# Patient Record
Sex: Female | Born: 1935
Health system: Southern US, Community
[De-identification: ages and names within clinical notes are randomized; demographics above are authoritative.]

## PROBLEM LIST (undated history)

## (undated) DIAGNOSIS — E785 Hyperlipidemia, unspecified: Secondary | ICD-10-CM

## (undated) DIAGNOSIS — J45909 Unspecified asthma, uncomplicated: Secondary | ICD-10-CM

## (undated) DIAGNOSIS — I10 Essential (primary) hypertension: Secondary | ICD-10-CM

## (undated) DIAGNOSIS — I6529 Occlusion and stenosis of unspecified carotid artery: Secondary | ICD-10-CM

## (undated) DIAGNOSIS — M810 Age-related osteoporosis without current pathological fracture: Secondary | ICD-10-CM

## (undated) DIAGNOSIS — D351 Benign neoplasm of parathyroid gland: Secondary | ICD-10-CM

## (undated) DIAGNOSIS — I255 Ischemic cardiomyopathy: Secondary | ICD-10-CM

## (undated) DIAGNOSIS — R739 Hyperglycemia, unspecified: Secondary | ICD-10-CM

## (undated) DIAGNOSIS — I252 Old myocardial infarction: Secondary | ICD-10-CM

## (undated) DIAGNOSIS — I219 Acute myocardial infarction, unspecified: Secondary | ICD-10-CM

## (undated) DIAGNOSIS — R519 Headache, unspecified: Secondary | ICD-10-CM

## (undated) HISTORY — PX: CATARACT EXTRACTION: SUR2

## (undated) HISTORY — PX: APPENDECTOMY: SHX54

## (undated) HISTORY — PX: FRACTURE SURGERY: SHX138

## (undated) HISTORY — PX: EYE SURGERY: SHX253

## (undated) HISTORY — PX: ABDOMINAL HYSTERECTOMY: SHX81

---

## 1960-05-13 DIAGNOSIS — I219 Acute myocardial infarction, unspecified: Secondary | ICD-10-CM

## 1960-05-13 HISTORY — DX: Acute myocardial infarction, unspecified: I21.9

## 2005-10-29 ENCOUNTER — Ambulatory Visit: Payer: Self-pay | Admitting: Internal Medicine

## 2005-10-29 ENCOUNTER — Ambulatory Visit: Payer: Self-pay | Admitting: Ophthalmology

## 2005-10-31 ENCOUNTER — Ambulatory Visit: Payer: Self-pay | Admitting: Internal Medicine

## 2005-11-04 ENCOUNTER — Ambulatory Visit: Payer: Self-pay | Admitting: Ophthalmology

## 2005-12-24 ENCOUNTER — Ambulatory Visit: Payer: Self-pay | Admitting: Ophthalmology

## 2005-12-30 ENCOUNTER — Ambulatory Visit: Payer: Self-pay | Admitting: Ophthalmology

## 2007-03-09 ENCOUNTER — Ambulatory Visit: Payer: Self-pay | Admitting: Family

## 2009-01-31 ENCOUNTER — Ambulatory Visit: Payer: Self-pay | Admitting: Family

## 2009-03-29 ENCOUNTER — Ambulatory Visit: Payer: Self-pay | Admitting: Family

## 2013-01-19 ENCOUNTER — Emergency Department: Payer: Self-pay | Admitting: Emergency Medicine

## 2013-09-15 ENCOUNTER — Observation Stay: Payer: Self-pay | Admitting: Internal Medicine

## 2013-09-15 LAB — COMPREHENSIVE METABOLIC PANEL
ANION GAP: 5 — AB (ref 7–16)
AST: 22 U/L (ref 15–37)
Albumin: 3.6 g/dL (ref 3.4–5.0)
Alkaline Phosphatase: 67 U/L
BUN: 22 mg/dL — AB (ref 7–18)
Bilirubin,Total: 0.5 mg/dL (ref 0.2–1.0)
Calcium, Total: 8.9 mg/dL (ref 8.5–10.1)
Chloride: 104 mmol/L (ref 98–107)
Co2: 27 mmol/L (ref 21–32)
Creatinine: 1.08 mg/dL (ref 0.60–1.30)
GFR CALC AF AMER: 53 — AB
GFR CALC NON AF AMER: 46 — AB
Glucose: 167 mg/dL — ABNORMAL HIGH (ref 65–99)
Osmolality: 279 (ref 275–301)
Potassium: 4.3 mmol/L (ref 3.5–5.1)
SGPT (ALT): 16 U/L (ref 12–78)
SODIUM: 136 mmol/L (ref 136–145)
TOTAL PROTEIN: 7.2 g/dL (ref 6.4–8.2)

## 2013-09-15 LAB — APTT: ACTIVATED PTT: 31.6 s (ref 23.6–35.9)

## 2013-09-15 LAB — TROPONIN I
Troponin-I: <0.02 ng/mL
Troponin-I: <0.02 ng/mL

## 2013-09-15 LAB — CBC
HCT: 38.6 % (ref 35.0–47.0)
HGB: 13 g/dL (ref 12.0–16.0)
MCH: 30.4 pg (ref 26.0–34.0)
MCHC: 33.6 g/dL (ref 32.0–36.0)
MCV: 91 fL (ref 80–100)
Platelet: 214 10*3/uL (ref 150–440)
RBC: 4.26 10*6/uL (ref 3.80–5.20)
RDW: 13 % (ref 11.5–14.5)
WBC: 5.4 10*3/uL (ref 3.6–11.0)

## 2013-09-15 LAB — CK TOTAL AND CKMB (NOT AT ARMC)
CK, TOTAL: 39 U/L
CK, TOTAL: 40 U/L
CK, Total: 48 U/L
CK-MB: 1 ng/mL (ref 0.5–3.6)
CK-MB: 1 ng/mL (ref 0.5–3.6)
CK-MB: 1.1 ng/mL (ref 0.5–3.6)

## 2013-09-15 LAB — PRO B NATRIURETIC PEPTIDE: B-TYPE NATIURETIC PEPTID: 210 pg/mL (ref 0–450)

## 2013-09-15 LAB — PROTIME-INR
INR: 1
Prothrombin Time: 13.3 secs (ref 11.5–14.7)

## 2013-09-16 LAB — CBC WITH DIFFERENTIAL/PLATELET
Basophil #: 0 10*3/uL (ref 0.0–0.1)
Basophil %: 0.6 %
EOS ABS: 0.2 10*3/uL (ref 0.0–0.7)
EOS PCT: 3.2 %
HCT: 36.5 % (ref 35.0–47.0)
HGB: 12.1 g/dL (ref 12.0–16.0)
LYMPHS ABS: 1.5 10*3/uL (ref 1.0–3.6)
Lymphocyte %: 30.8 %
MCH: 30 pg (ref 26.0–34.0)
MCHC: 33.2 g/dL (ref 32.0–36.0)
MCV: 90 fL (ref 80–100)
MONOS PCT: 9.2 %
Monocyte #: 0.4 x10 3/mm (ref 0.2–0.9)
NEUTROS PCT: 56.2 %
Neutrophil #: 2.7 10*3/uL (ref 1.4–6.5)
Platelet: 188 10*3/uL (ref 150–440)
RBC: 4.05 10*6/uL (ref 3.80–5.20)
RDW: 13.1 % (ref 11.5–14.5)
WBC: 4.8 10*3/uL (ref 3.6–11.0)

## 2013-09-16 LAB — BASIC METABOLIC PANEL
Anion Gap: 3 — ABNORMAL LOW (ref 7–16)
BUN: 21 mg/dL — ABNORMAL HIGH (ref 7–18)
Calcium, Total: 8.6 mg/dL (ref 8.5–10.1)
Chloride: 103 mmol/L (ref 98–107)
Co2: 32 mmol/L (ref 21–32)
Creatinine: 1.53 mg/dL — ABNORMAL HIGH (ref 0.60–1.30)
EGFR (African American): 35 — ABNORMAL LOW
EGFR (Non-African Amer.): 30 — ABNORMAL LOW
GLUCOSE: 94 mg/dL (ref 65–99)
OSMOLALITY: 278 (ref 275–301)
POTASSIUM: 3.8 mmol/L (ref 3.5–5.1)
SODIUM: 138 mmol/L (ref 136–145)

## 2013-09-17 LAB — BASIC METABOLIC PANEL
ANION GAP: 6 — AB (ref 7–16)
BUN: 27 mg/dL — ABNORMAL HIGH (ref 7–18)
CALCIUM: 8.5 mg/dL (ref 8.5–10.1)
CREATININE: 1.52 mg/dL — AB (ref 0.60–1.30)
Chloride: 104 mmol/L (ref 98–107)
Co2: 29 mmol/L (ref 21–32)
GFR CALC AF AMER: 35 — AB
GFR CALC NON AF AMER: 30 — AB
GLUCOSE: 87 mg/dL (ref 65–99)
OSMOLALITY: 282 (ref 275–301)
POTASSIUM: 3.7 mmol/L (ref 3.5–5.1)
Sodium: 139 mmol/L (ref 136–145)

## 2013-09-17 LAB — CBC WITH DIFFERENTIAL/PLATELET
BASOS ABS: 0 10*3/uL (ref 0.0–0.1)
Basophil %: 0.9 %
Eosinophil #: 0.2 10*3/uL (ref 0.0–0.7)
Eosinophil %: 3.8 %
HCT: 37.4 % (ref 35.0–47.0)
HGB: 12.5 g/dL (ref 12.0–16.0)
Lymphocyte #: 1.7 10*3/uL (ref 1.0–3.6)
Lymphocyte %: 37.7 %
MCH: 30.1 pg (ref 26.0–34.0)
MCHC: 33.3 g/dL (ref 32.0–36.0)
MCV: 90 fL (ref 80–100)
MONO ABS: 0.4 x10 3/mm (ref 0.2–0.9)
MONOS PCT: 9.1 %
NEUTROS ABS: 2.1 10*3/uL (ref 1.4–6.5)
Neutrophil %: 48.5 %
PLATELETS: 173 10*3/uL (ref 150–440)
RBC: 4.15 10*6/uL (ref 3.80–5.20)
RDW: 13.1 % (ref 11.5–14.5)
WBC: 4.4 10*3/uL (ref 3.6–11.0)

## 2013-09-24 DIAGNOSIS — I4891 Unspecified atrial fibrillation: Secondary | ICD-10-CM | POA: Insufficient documentation

## 2013-09-28 DIAGNOSIS — N183 Chronic kidney disease, stage 3 unspecified: Secondary | ICD-10-CM | POA: Insufficient documentation

## 2014-09-03 NOTE — H&P (Signed)
PATIENT NAME:  Anna Barrett, Anna Barrett MR#:  [id number] DATE OF BIRTH:  November 05, 1922  DATE OF ADMISSION:  09/15/2013  PRIMARY CARE PHYSICIAN: Ocie Cornfield. Ouida Sills, MD  CARDIOLOGIST: Corey Skains, MD  The patient is an 81 year old Caucasian female with past medical history significant for history of hypertension, congestive heart failure, hyperlipidemia, as well as coronary artery disease and asthma, who presents to the hospital with complaints of weakness. According to the patient, she was doing well until today when she became very weak and her legs were stumbling. She had to sit down. She was also nauseated and sick to her stomach and felt presyncopal. She also apparently complained of some chest pains to Emergency Room physician; however, she denied any significant chest pains to me. She admits that she has been having chest pains for the past 3 years now, for which she is taking nitroglycerin intermittently. Admits that over the past few months, she has been having problems with cold hands, turning them white as well as blue intermittently, and today she had quivery legs according to her. Her last stress was years ago. In fact, I was able to find that she had echocardiogram as well as stress test before November 2010, and apparently these were normal. She presented to the hospital for further evaluation. In the Emergency Room, she was noted to be tachycardic with heart rate around 110. She was also hypertensive with systolic blood pressure around 160.   PAST MEDICAL HISTORY: Significant for history of hypertension, hyperlipidemia, asthma, history of congestive heart failure (diastolic, chronic), coronary artery disease, ischemic cardiomyopathy, depression, left bundle branch block, migraine headaches. Echo as well as stress test done before November 2010 which were normal.   PAST SURGICAL HISTORY: Gallbladder, appendectomy, as well as hysterectomy.   ALLERGIES: MULTIPLE INCLUDING SPIRONOLACTONE, IV DYE,  LIPITOR, AS WELL AS SHELLFISH.  FAMILY HISTORY: Colon cancer, hypertension, as well as coronary disease.   SOCIAL HISTORY: The patient is widowed. No alcohol abuse or smoking.  REVIEW OF SYSTEMS: Difficult to obtain, as the patient is a poor historian. Admits of feeling cold intermittently, fatigue and weak, some blurring of vision. She tells me that her eye vision is impaired because of some kind of infection. Also some sinus congestion, allergies (possibly seasonal), some intermittent cough as well as intermittent wheezes, and shortness of breath especially whenever she walks around, however, denies any significant orthopnea. Admits of having some whitish phlegm. Intermittent chest pains, which she describes as chronic, has been going on for a few years. Lower extremity edema seems to be in left lower extremity more than right lower extremity. Also feeling presyncopal earlier today, as well as nausea and dry heaving.  CONSTITUTIONAL: Otherwise, denies any high fevers, pains, weight loss, weight gain.  EYES: Denies any double vision or glaucoma.  EARS, NOSE, THROAT: Denies any tinnitus, allergies, epistaxis, sinus pain, dentures, difficulty swallowing. RESPIRATORY: Denies any hemoptysis, asthma, COPD. CARDIOVASCULAR: Denies any orthopnea, arrhythmias or palpitations. GASTROINTESTINAL: Denies any vomiting, diarrhea or constipation. GENITOURINARY: Denies dysuria, hematuria, frequency or incontinence. ENDOCRINE: Denies any polydipsia, nocturia, thyroid problems, heat or cold intolerance or thirst. HEMATOLOGIC: Denies any anemia, easy bruising, bleeding, swollen glands. SKIN: Denies any acne, rashes, lesions or change in moles.  MUSCULOSKELETAL: Denies arthritis, cramps, swelling, gout.  NEUROLOGICAL: No numbness, epilepsy or tremors. PSYCHIATRIC: Denies anxiety, insomnia or depression.  PHYSICAL EXAMINATION:  VITAL SIGNS: On arrival to the hospital, the patient's temperature was 97.4. Pulse was  111. Respiratory rate was 18, blood pressure 165/99. Saturation  was 97% on room air. GENERAL: This is a well-developed, well-nourished, Caucasian female. She is comfortable on the stretcher.  HEENT: Her pupils are equal and reactive to light. Extraocular movements intact. No icterus or conjunctivitis. Has normal hearing. No pharyngeal erythema. Mucosa is moist.  NECK: No masses. Supple, nontender. Thyroid is not enlarged. No adenopathy. No JVD or carotid bruits bilaterally. Full range of motion.  LUNGS: Clear to auscultation in upper lobes, however, some rales and crackles at the bases. She has no wheezing, labored inspirations, increased effort, dullness to percussion. Not in overt respiratory distress.  CARDIOVASCULAR: S1, S2 appreciated. The patient's rhythm was irregularly irregular. PMI not lateralized. She has mild murmur, II/VI systolic murmur in precordium. Chest is nontender to palpation.  EXTREMITIES: Pedal pulses 1+, 1 to 2+ lower extremity edema around her ankles. No calf tenderness or cyanosis was noted.  ABDOMEN: Soft, nontender. Bowel sounds are present. No hepatosplenomegaly or masses were noted.  RECTAL: Deferred.  MUSCULOSKELETAL: Muscle strength: Able to move all extremities. No cyanosis, degenerative joint disease or kyphosis was noted. Gait was not tested.  SKIN: Did not reveal any rashes, lesions, erythema, nodularity or induration. It was warm and dry to palpation.  LYMPHATIC: No adenopathy in the cervical region.  NEUROLOGICAL: Cranial nerves grossly intact. Sensory is intact. No dysarthria or aphasia. The patient is alert, oriented to time, person and place, cooperative. Memory is good; however, she is a very difficult historian. PSYCHIATRIC: No significant confusion, agitation or depression. She has intermittent laugh episodes, which are just inappropriate.  LABORATORY DATA: BMP showed glucose of 167, BUN of 22. Beta-type natriuretic peptide was 210. Otherwise, BMP was  unremarkable. The patient's liver enzymes were normal. Cardiac enzymes x 1 were within normal limits CBC within normal limits with white blood cell count of 5.4, hemoglobin 13.0, platelet count 214. Coagulation panel was unremarkable with pro time of 13.3, INR 1.0, and activated PTT 31.6.  The patient's EKG showed wide QRS rhythm with premature supraventricular complexes, left bundle branch block, abnormal EKG. Nonspecific ST-T changes were noted. No EKG to compare with. Likely A. fib.   RADIOLOGIC STUDIES: Chest x-ray, portable single view, 6th of May 2015, revealed no acute disease.   ASSESSMENT AND PLAN: 1.  Presyncope: Questionably related to atrial fibrillation. Admit patient to medical floor. Get orthostatics. Get Myoview stress test as well as carotid ultrasound. Get cardiology consultation. 2.  Tachycardia with atrial fibrillation: Get TSH and advance the patient's Coreg for now. We may need to add other medications. 3.  Hypertension: Add lisinopril to advanced Coreg. Follow blood pressure readings. 4.  Acute on chronic diastolic congestive heart failure: Will give Lasix IV once. Will continue torsemide and will advance blood pressure medications, adding lisinopril.  5.  Atrial fibrillation, new diagnosis: Get echocardiogram as well as my Myoview. Cardiology consultation is pending. Will also initiate the patient on aspirin therapy. The patient would benefit from anticoagulation if the patient's atrial fibrillation is chronic.   TIME SPENT: 50 minutes.    ____________________________ Theodoro Grist, MD rv:jcm D: 09/15/2013 13:50:58 ET T: 09/15/2013 15:46:28 ET JOB#: [id number]  cc: Theodoro Grist, MD, <Dictator> Ocie Cornfield. Ouida Sills, MD  Theodoro Grist MD ELECTRONICALLY SIGNED 10/19/2013 15:29

## 2014-10-14 ENCOUNTER — Other Ambulatory Visit: Payer: Self-pay | Admitting: Physician Assistant

## 2014-10-14 ENCOUNTER — Ambulatory Visit
Admission: RE | Admit: 2014-10-14 | Discharge: 2014-10-14 | Disposition: A | Discharge status: Home / Self Care | Payer: Medicare Other | Source: Ambulatory Visit | Attending: Physician Assistant | Admitting: Physician Assistant

## 2014-10-14 DIAGNOSIS — M7989 Other specified soft tissue disorders: Secondary | ICD-10-CM

## 2015-01-10 DIAGNOSIS — D692 Other nonthrombocytopenic purpura: Secondary | ICD-10-CM | POA: Insufficient documentation

## 2015-04-16 ENCOUNTER — Emergency Department: Payer: Medicare Other

## 2015-04-16 ENCOUNTER — Inpatient Hospital Stay
Admission: EM | Admit: 2015-04-16 | Discharge: 2015-04-19 | DRG: 493 | Disposition: A | Discharge status: Skilled Nursing Facility | Payer: Medicare Other | Attending: Internal Medicine | Admitting: Internal Medicine

## 2015-04-16 DIAGNOSIS — E785 Hyperlipidemia, unspecified: Secondary | ICD-10-CM | POA: Diagnosis present

## 2015-04-16 DIAGNOSIS — R000 Tachycardia, unspecified: Secondary | ICD-10-CM | POA: Diagnosis present

## 2015-04-16 DIAGNOSIS — S82892A Other fracture of left lower leg, initial encounter for closed fracture: Secondary | ICD-10-CM | POA: Diagnosis present

## 2015-04-16 DIAGNOSIS — I252 Old myocardial infarction: Secondary | ICD-10-CM

## 2015-04-16 DIAGNOSIS — M810 Age-related osteoporosis without current pathological fracture: Secondary | ICD-10-CM | POA: Diagnosis present

## 2015-04-16 DIAGNOSIS — Z8249 Family history of ischemic heart disease and other diseases of the circulatory system: Secondary | ICD-10-CM | POA: Diagnosis not present

## 2015-04-16 DIAGNOSIS — I110 Hypertensive heart disease with heart failure: Secondary | ICD-10-CM | POA: Diagnosis present

## 2015-04-16 DIAGNOSIS — S9305XA Dislocation of left ankle joint, initial encounter: Secondary | ICD-10-CM

## 2015-04-16 DIAGNOSIS — W07XXXA Fall from chair, initial encounter: Secondary | ICD-10-CM | POA: Diagnosis present

## 2015-04-16 DIAGNOSIS — J45909 Unspecified asthma, uncomplicated: Secondary | ICD-10-CM | POA: Diagnosis present

## 2015-04-16 DIAGNOSIS — D62 Acute posthemorrhagic anemia: Secondary | ICD-10-CM | POA: Diagnosis not present

## 2015-04-16 DIAGNOSIS — S82852A Displaced trimalleolar fracture of left lower leg, initial encounter for closed fracture: Principal | ICD-10-CM | POA: Diagnosis present

## 2015-04-16 DIAGNOSIS — I509 Heart failure, unspecified: Secondary | ICD-10-CM | POA: Diagnosis present

## 2015-04-16 DIAGNOSIS — R0600 Dyspnea, unspecified: Secondary | ICD-10-CM

## 2015-04-16 DIAGNOSIS — Y92009 Unspecified place in unspecified non-institutional (private) residence as the place of occurrence of the external cause: Secondary | ICD-10-CM | POA: Diagnosis not present

## 2015-04-16 DIAGNOSIS — S82899A Other fracture of unspecified lower leg, initial encounter for closed fracture: Secondary | ICD-10-CM

## 2015-04-16 DIAGNOSIS — R200 Anesthesia of skin: Secondary | ICD-10-CM | POA: Diagnosis present

## 2015-04-16 HISTORY — DX: Hyperlipidemia, unspecified: E78.5

## 2015-04-16 HISTORY — DX: Old myocardial infarction: I25.2

## 2015-04-16 HISTORY — DX: Age-related osteoporosis without current pathological fracture: M81.0

## 2015-04-16 HISTORY — DX: Essential (primary) hypertension: I10

## 2015-04-16 HISTORY — DX: Acute myocardial infarction, unspecified: I21.9

## 2015-04-16 LAB — BASIC METABOLIC PANEL
ANION GAP: 5 (ref 5–15)
BUN: 27 mg/dL — ABNORMAL HIGH (ref 6–20)
CO2: 26 mmol/L (ref 22–32)
Calcium: 8.7 mg/dL — ABNORMAL LOW (ref 8.9–10.3)
Chloride: 107 mmol/L (ref 101–111)
Creatinine, Ser: 0.99 mg/dL (ref 0.44–1.00)
GFR, EST AFRICAN AMERICAN: 56 mL/min — AB (ref >60)
GFR, EST NON AFRICAN AMERICAN: 49 mL/min — AB (ref >60)
Glucose, Bld: 117 mg/dL — ABNORMAL HIGH (ref 65–99)
POTASSIUM: 4.4 mmol/L (ref 3.5–5.1)
SODIUM: 138 mmol/L (ref 135–145)

## 2015-04-16 LAB — CBC WITH DIFFERENTIAL/PLATELET
BASOS ABS: 0.1 10*3/uL (ref 0–0.1)
BASOS ABS: 0.1 10*3/uL (ref 0–0.1)
Basophils Relative: 1 %
Basophils Relative: 1 %
EOS PCT: 1 %
Eosinophils Absolute: 0.1 10*3/uL (ref 0–0.7)
Eosinophils Absolute: 0.1 10*3/uL (ref 0–0.7)
Eosinophils Relative: 1 %
HEMATOCRIT: 38.6 % (ref 35.0–47.0)
HEMATOCRIT: 37 % (ref 35.0–47.0)
HEMOGLOBIN: 12.9 g/dL (ref 12.0–16.0)
Hemoglobin: 12.4 g/dL (ref 12.0–16.0)
LYMPHS ABS: 0.7 10*3/uL — AB (ref 1.0–3.6)
LYMPHS PCT: 7 %
LYMPHS PCT: 7 %
Lymphs Abs: 0.6 10*3/uL — ABNORMAL LOW (ref 1.0–3.6)
MCH: 30.1 pg (ref 26.0–34.0)
MCH: 30.3 pg (ref 26.0–34.0)
MCHC: 33.5 g/dL (ref 32.0–36.0)
MCHC: 33.5 g/dL (ref 32.0–36.0)
MCV: 89.9 fL (ref 80.0–100.0)
MCV: 90.5 fL (ref 80.0–100.0)
MONO ABS: 0.6 10*3/uL (ref 0.2–0.9)
Monocytes Absolute: 0.5 10*3/uL (ref 0.2–0.9)
Monocytes Relative: 7 %
Monocytes Relative: 6 %
NEUTROS ABS: 6.7 10*3/uL — AB (ref 1.4–6.5)
NEUTROS ABS: 8.8 10*3/uL — AB (ref 1.4–6.5)
NEUTROS PCT: 84 %
Neutrophils Relative %: 85 %
PLATELETS: 274 10*3/uL (ref 150–440)
Platelets: 274 10*3/uL (ref 150–440)
RBC: 4.29 MIL/uL (ref 3.80–5.20)
RBC: 4.09 MIL/uL (ref 3.80–5.20)
RDW: 12.5 % (ref 11.5–14.5)
RDW: 12.8 % (ref 11.5–14.5)
WBC: 8 10*3/uL (ref 3.6–11.0)
WBC: 10.2 10*3/uL (ref 3.6–11.0)

## 2015-04-16 MED ORDER — MAGNESIUM HYDROXIDE 400 MG/5ML PO SUSP: 30.0000 mL | Freq: Every day | ORAL | Status: DC | PRN

## 2015-04-16 MED ORDER — HYDROMORPHONE HCL 1 MG/ML IJ SOLN
0.5000 mg | INTRAMUSCULAR | Status: DC | PRN
  Administered 2015-04-16 – 2015-04-17 (×3): 0.5 mg via INTRAVENOUS
  Filled 2015-04-16 (×3): qty 1

## 2015-04-16 MED ORDER — ACETAMINOPHEN 650 MG RE SUPP: 650.0000 mg | Freq: Four times a day (QID) | RECTAL | Status: DC | PRN

## 2015-04-16 MED ORDER — DOCUSATE SODIUM 100 MG PO CAPS
100.0000 mg | ORAL_CAPSULE | Freq: Two times a day (BID) | ORAL | Status: DC
  Administered 2015-04-16: 100 mg via ORAL
  Filled 2015-04-16: qty 1

## 2015-04-16 MED ORDER — KCL IN DEXTROSE-NACL 20-5-0.9 MEQ/L-%-% IV SOLN
INTRAVENOUS | Status: DC
  Administered 2015-04-16: 22:00:00 via INTRAVENOUS
  Filled 2015-04-16 (×5): qty 1000

## 2015-04-16 MED ORDER — FENTANYL CITRATE (PF) 100 MCG/2ML IJ SOLN
50.0000 ug | Freq: Once | INTRAMUSCULAR | Status: AC
  Administered 2015-04-16: 50 ug via INTRAVENOUS
  Filled 2015-04-16: qty 2

## 2015-04-16 MED ORDER — BISACODYL 10 MG RE SUPP: 10.0000 mg | Freq: Every day | RECTAL | Status: DC | PRN

## 2015-04-16 MED ORDER — FENTANYL CITRATE (PF) 100 MCG/2ML IJ SOLN
INTRAMUSCULAR | Status: AC
  Administered 2015-04-16: 75 ug via INTRAVENOUS
  Filled 2015-04-16: qty 2

## 2015-04-16 MED ORDER — TORSEMIDE 5 MG PO TABS
5.0000 mg | ORAL_TABLET | Freq: Every day | ORAL | Status: DC
  Administered 2015-04-18 – 2015-04-19 (×2): 5 mg via ORAL
  Filled 2015-04-16 (×4): qty 1

## 2015-04-16 MED ORDER — FENTANYL CITRATE (PF) 100 MCG/2ML IJ SOLN
75.0000 ug | Freq: Once | INTRAMUSCULAR | Status: AC
  Administered 2015-04-16: 75 ug via INTRAVENOUS

## 2015-04-16 MED ORDER — CEFAZOLIN SODIUM-DEXTROSE 2-3 GM-% IV SOLR
2.0000 g | INTRAVENOUS | Status: AC
  Administered 2015-04-17: 2 g via INTRAVENOUS
  Filled 2015-04-16: qty 50

## 2015-04-16 MED ORDER — DILTIAZEM HCL ER COATED BEADS 120 MG PO CP24
120.0000 mg | ORAL_CAPSULE | Freq: Every day | ORAL | Status: DC
  Administered 2015-04-17 – 2015-04-19 (×3): 120 mg via ORAL
  Filled 2015-04-16 (×3): qty 1

## 2015-04-16 MED ORDER — PRAVASTATIN SODIUM 20 MG PO TABS: 20.0000 mg | ORAL_TABLET | Freq: Every day | ORAL | Status: DC

## 2015-04-16 MED ORDER — FLEET ENEMA 7-19 GM/118ML RE ENEM: 1.0000 | ENEMA | Freq: Once | RECTAL | Status: DC | PRN

## 2015-04-16 MED ORDER — PANTOPRAZOLE SODIUM 40 MG IV SOLR
40.0000 mg | Freq: Every day | INTRAVENOUS | Status: DC
  Administered 2015-04-16: 40 mg via INTRAVENOUS
  Filled 2015-04-16: qty 40

## 2015-04-16 MED ORDER — ACETAMINOPHEN 325 MG PO TABS: 650.0000 mg | ORAL_TABLET | Freq: Four times a day (QID) | ORAL | Status: DC | PRN

## 2015-04-16 NOTE — Consult Note (Signed)
[facility] [place] NAME: Anna Barrett    MR#:  [id number]  DATE OF BIRTH:  [birth year]  DATE OF CONSULT:  04/16/2015  PRIMARY CARE PHYSICIAN: Kirk Ruths., MD   REQUESTING/REFERRING PHYSICIAN: Dr. Roland Rack  CHIEF COMPLAINT:   Chief Complaint  Patient presents with  . Ankle Pain  . Fall    HISTORY OF PRESENT ILLNESS:  Anna Barrett  is a 81 y.o. female with a known history of hypertension, osteoporosis, hyperlipidemia, history of previous MI who presents to the hospital after a fall earlier today and noted to have left ankle pain. Patient had no prodromal symptoms prior to her fall earlier today. She says that she was getting up out of her chair and turned around and lost her footing and fell to the floor. She crawled herself to a phone and called her son who called EMS to bring her to the hospital. Patient was noted to have a left ankle trimalleolar fracture and hospitalist services were contacted for consultation preoperatively. Patient denies any chest pain, shortness of breath, nausea, vomiting, abdominal pain, fever, chills or any other associated symptoms.  PAST MEDICAL HISTORY:   Past Medical History  Diagnosis Date  . Hypertension   . Osteoporosis   . Hyperlipidemia   . History of MI (myocardial infarction)     PAST SURGICAL HISTOIRY:  History reviewed. No pertinent past surgical history.  SOCIAL HISTORY:   Social History  Substance Use Topics  . Smoking status: Never Smoker   . Smokeless tobacco: Not on file  . Alcohol Use: No    FAMILY HISTORY:   Family History  Problem Relation Age of Onset  . Heart disease Mother   . Heart disease Father     DRUG ALLERGIES:  No Known Allergies  REVIEW OF SYSTEMS:   Review of Systems  Constitutional: Negative for fever and weight loss.  HENT: Negative for congestion, nosebleeds and tinnitus.   Eyes: Negative for blurred vision, double vision and redness.   Respiratory: Negative for cough, hemoptysis and shortness of breath.   Cardiovascular: Negative for chest pain, orthopnea, leg swelling and PND.  Gastrointestinal: Negative for nausea, vomiting, abdominal pain, diarrhea and melena.  Genitourinary: Negative for dysuria, urgency and hematuria.  Musculoskeletal: Positive for joint pain (Left ankle) and falls.  Neurological: Negative for dizziness, tingling, sensory change, focal weakness, seizures, weakness and headaches.  Endo/Heme/Allergies: Negative for polydipsia. Does not bruise/bleed easily.  Psychiatric/Behavioral: Negative for depression and memory loss. The patient is not nervous/anxious.      MEDICATIONS AT HOME:   Prior to Admission medications   Not on File      VITAL SIGNS:  Blood pressure 181/90, pulse 109, temperature 98.7 F (37.1 C), resp. rate 16, SpO2 97 %.  PHYSICAL EXAMINATION:  GENERAL:  81 y.o.-year-old patient lying in the bed with no acute distress.  EYES: Pupils equal, round, reactive to light and accommodation. No scleral icterus. Extraocular muscles intact.  HEENT: Head atraumatic, normocephalic. Oropharynx and nasopharynx clear.  NECK:  Supple, no jugular venous distention. No thyroid enlargement, no tenderness.  LUNGS: Normal breath sounds bilaterally, no wheezing, rales, rhonchi . No use of accessory muscles of respiration.  CARDIOVASCULAR: S1, S2, RRR. No murmurs, rubs, gallops, clicks.  ABDOMEN: Soft, nontender, nondistended. Bowel sounds present. No organomegaly or mass.  EXTREMITIES: No pedal edema, cyanosis, or clubbing. Left ankle in a cast NEUROLOGIC: Cranial nerves II through XII are intact. No focal motor or sensory deficits  appreciated bilaterally  PSYCHIATRIC: The patient is alert and oriented x 3. Good affect SKIN: No obvious rash, lesion, or ulcer.   LABORATORY PANEL:   CBC  Recent Labs Lab 04/16/15 1617  WBC 8.0  HGB 12.9  HCT 38.6  PLT 274    ------------------------------------------------------------------------------------------------------------------  Chemistries   Recent Labs Lab 04/16/15 1617  NA 138  K 4.4  CL 107  CO2 26  GLUCOSE 117*  BUN 27*  CREATININE 0.99  CALCIUM 8.7*   ------------------------------------------------------------------------------------------------------------------  Cardiac Enzymes No results for input(s): TROPONINI in the last 168 hours. ------------------------------------------------------------------------------------------------------------------  RADIOLOGY:  Dg Chest 1 View  04/16/2015  CLINICAL DATA:  Preop left ankle fracture EXAM: CHEST 1 VIEW COMPARISON:  09/15/2013 FINDINGS: Chronic interstitial markings. Mild right basilar scarring. No focal consolidation. No pleural effusion or pneumothorax. Cardiomegaly. IMPRESSION: No evidence of acute cardiopulmonary disease. Electronically Signed   By: Julian Hy M.D.   On: 04/16/2015 17:48   Dg Ankle 2 Views Left  04/16/2015  CLINICAL DATA:  Status post reduction of a left ankle fracture/dislocation. EXAM: LEFT ANKLE - 2 VIEW COMPARISON:  Earlier today. FINDINGS: A trimalleolar fracture is again demonstrated. There is better visualization of the posterior malleolus fracture fragment, posteriorly displaced. There is persistent posterior displacement of the talus relative to the tibia with reduction of the previously seen lateral displacement of the talus relative to the tibia. There is also improved position and alignment of the medial and lateral malleolus fracture fragments with mild residual lateral displacement and lateral angulation of the distal fragments. IMPRESSION: Overall improved position and alignment of a trimalleolar fracture with persistent posterior subluxation of the talus relative to the distal tibia and interval posterior displacement of the posterior fragment of the posterior malleolus fracture. Electronically  Signed   By: Claudie Revering M.D.   On: 04/16/2015 18:13   Dg Ankle 2 Views Left  04/16/2015  CLINICAL DATA:  [place] today and injured left ankle. EXAM: LEFT ANKLE - 2 VIEW COMPARISON:  None. FINDINGS: The talus is dislocated posteriorly and laterally compared to the tibia. There is a displaced and comminuted fracture through the medial malleolus and also likely through the posterior malleolus of the tibia. There is a markedly displaced and angulated fracture of the distal fibular shaft. The talus is intact. No definite foot fractures. IMPRESSION: Fracture dislocation at the ankle as above. Electronically Signed   By: Marijo Sanes M.D.   On: 04/16/2015 17:11     IMPRESSION AND PLAN:   81 year old female with past medical history of hypertension, hyperlipidemia, history of previous MI, osteoporosis who presents to the hospital after a fall and noted to have a left ankle fracture.  #1 preoperative medical evaluation-patient is a low risk for noncardiac surgery. -No contraindications to surgery at this time. -EKG has been reviewed and shows no acute ST or T-wave changes.  #2 left ankle fracture-likely secondary to the mechanical fall. -Continue care as per orthopedics.  #3 hypertension-continue Cardizem.  #4 hyperlipidemia-continue lovastatin.  #5 osteoporosis-continue vitamin D supplements.   All the records are reviewed and case discussed with Consulting provider. Management plans discussed with the patient, family and they are in agreement.  CODE STATUS: Full  TOTAL TIME TAKING CARE OF THIS PATIENT: 45 minutes.    Henreitta Leber M.D on 04/16/2015 at 6:21 PM  Between 7am to 6pm - Pager - [phone]  After 6pm go to [email or web address] - password [email or web address] [facility]  [facility]  Office  [phone]  CC: Primary care  Physician: Kirk Ruths., MD

## 2015-04-16 NOTE — ED Notes (Signed)
Pt arrived via EMS,  Reports she fell out of chair at home and twisted her ankle,able to move toes. Obvious swelling and deformity noted. EMS gave 50 fentanyl

## 2015-04-16 NOTE — ED Provider Notes (Signed)
[facility] Emergency Department Provider Note   ____________________________________________  Time seen:  I have reviewed the triage vital signs and the triage nursing note.  HISTORY  Chief Complaint Ankle Pain and Fall   Historian Patient  HPI BAYLOR RANUM is a 81 y.o. female who is here for evaluation of left ankle pain and deformity after getting up out of a chair and twisting the ankle. No additional injuries such as no head injury, neck injury, back injury, pelvic injury. Pain is moderate. She is able to feel her toes and wiggle them. There is no numbness. No alleviating factors.    Past Medical History  Diagnosis Date  . Hypertension     There are no active problems to display for this patient.   History reviewed. No pertinent past surgical history.  No current outpatient prescriptions on file.  Allergies Review of patient's allergies indicates no known allergies.  No family history on file.  Social History Social History  Substance Use Topics  . Smoking status: Never Smoker   . Smokeless tobacco: None  . Alcohol Use: No    Review of Systems  Constitutional: Negative for fever. Eyes: Negative for visual changes. ENT: Negative for sore throat. Cardiovascular: Negative for chest pain. Respiratory: Negative for shortness of breath. Gastrointestinal: Negative for abdominal pain, vomiting and diarrhea. Genitourinary: Negative for dysuria. Musculoskeletal: Negative for back pain. Skin: Negative for rash. Neurological: Negative for headache. 10 point Review of Systems otherwise negative ____________________________________________   PHYSICAL EXAM:  VITAL SIGNS: ED Triage Vitals  Enc Vitals Group     BP 04/16/15 1618 181/90 mmHg     Pulse Rate 04/16/15 1618 109     Resp 04/16/15 1618 16     Temp 04/16/15 1618 98.7 F (37.1 C)     Temp src --      SpO2 04/16/15 1618 97 %     Weight --      Height --      Head Cir --      Peak Flow --      Pain Score 04/16/15 1608 5     Pain Loc --      Pain Edu? --      Excl. in [place]? --      Constitutional: Alert and oriented. Well appearing and in no distress. Eyes: Conjunctivae are normal. PERRL. Normal extraocular movements. ENT   Head: Normocephalic and atraumatic.   Nose: No congestion/rhinnorhea.   Mouth/Throat: Mucous membranes are moist.   Neck: No stridor. Cardiovascular/Chest: Normal rate, regular rhythm.  No murmurs, rubs, or gallops. Respiratory: Normal respiratory effort without tachypnea nor retractions. Breath sounds are clear and equal bilaterally. No wheezes/rales/rhonchi. Gastrointestinal: Soft. No distention, no guarding, no rebound. Nontender   Genitourinary/rectal:Deferred Musculoskeletal: significant left ankle deformity with swelling and early ecchymosis with mild skin tenting medially. Dorsalis pedis pulse palpable. Normal cap refill and warmth. Neurologic:  Normal speech and language. No gross or focal neurologic deficits are appreciated. Skin:  Skin is warm, dry and intact. No rash noted. Psychiatric: Mood and affect are normal. Speech and behavior are normal. Patient exhibits appropriate insight and judgment.  ____________________________________________   EKG I, Lisa Roca, MD, the attending physician have personally viewed and interpreted all ECGs.  86 bpm. Normal sinus rhythm. Left bundle branch block. Normal axis. ____________________________________________  LABS (pertinent positives/negatives)  CBC shows a white blood count of 8.0, hemoglobin 12.9 Account 74 Basic metabolic panel significant for BUN 27 and otherwise without significant abnormality  ____________________________________________  RADIOLOGY All Xrays were viewed by me. Imaging interpreted by Radiologist.  Ankle 2 views:FINDINGS: The talus is dislocated posteriorly and laterally compared to the tibia. There is a displaced and comminuted fracture  through the medial malleolus and also likely through the posterior malleolus of the tibia. There is a markedly displaced and angulated fracture of the distal fibular shaft. The talus is intact. No definite foot fractures.  IMPRESSION: Fracture dislocation at the ankle as above.  Chest x-ray 1 view: Negative  Left ankle 2 views postreduction:   IMPRESSION: Overall improved position and alignment of a trimalleolar fracture with persistent posterior subluxation of the talus relative to the distal tibia and interval posterior displacement of the posterior fragment of the posterior malleolus fracture. __________________________________________  PROCEDURES  Procedure(s) performed: Dislocation/Reduction:  Left ankle.  Performed by Reita Cliche, MD.   Verbal consent from patient with risks and benefits discussed including vascular and neurologic.  Ankle was easily reduced into anatomic position and splinted. Neurovascularly before and after the reduction. Post reduction x-ray was obtained  Left ankle splint: Performed by Reita Cliche, M.D. And tech Posterior and stirrup splint placed after ankle dislocation reduction. Neurovascularly intact after application.  Critical Care performed: None  ____________________________________________   ED COURSE / ASSESSMENT AND PLAN  CONSULTATIONS: Dr. Roland Rack (Orthopedics) - will plan for surgery tomorrow  -- recommends medical admission.  Discussed with hospitalist.  Pertinent labs & imaging results that were available during my care of the patient were reviewed by me and considered in my medical decision making (see chart for details).   No additional traumatic injuries other then left ankle clinically.  Neurovascularly intact on initial exam and then also post reduction. Trimalleolar fracture with tibial/talar dislocation, s/p reduction - remains nv intact.    Preoperative labs and evaluation obtained for medical clearance in preparation for surgery by  ortho tomorrow.  Postreduction x-ray shows improved medial/lateral alignment, with some persistent subluxation of the talus posteriorly. I discussed this with Dr.Poggi, and he is okay with that current post reduction. Patient is neurovascularly intact and comfortable.  Patient / Family / Caregiver informed of clinical course, medical decision-making process, and agree with plan.    ___________________________________________   FINAL CLINICAL IMPRESSION(S) / ED DIAGNOSES   Final diagnoses:  Ankle fracture, left, closed, initial encounter  Trimalleolar fracture of ankle, closed, left, initial encounter  Ankle dislocation, left, initial encounter       Lisa Roca, MD 04/16/15 [id number]

## 2015-04-17 ENCOUNTER — Inpatient Hospital Stay: Payer: Medicare Other

## 2015-04-17 ENCOUNTER — Encounter
Admission: EM | Disposition: A | Discharge status: Skilled Nursing Facility | Payer: Self-pay | Source: Home / Self Care | Attending: Surgery

## 2015-04-17 ENCOUNTER — Inpatient Hospital Stay: Admit: 2015-04-17 | Payer: Medicare Other

## 2015-04-17 ENCOUNTER — Encounter: Payer: Self-pay | Admitting: Anesthesiology

## 2015-04-17 ENCOUNTER — Inpatient Hospital Stay: Payer: Medicare Other | Admitting: Anesthesiology

## 2015-04-17 HISTORY — PX: ORIF ANKLE FRACTURE: SHX5408

## 2015-04-17 LAB — MRSA PCR SCREENING: MRSA by PCR: NEGATIVE

## 2015-04-17 LAB — TROPONIN I: Troponin I: <0.03 ng/mL (ref <0.031)

## 2015-04-17 SURGERY — OPEN REDUCTION INTERNAL FIXATION (ORIF) ANKLE FRACTURE
Anesthesia: General | Site: Ankle | Laterality: Left | Wound class: Clean

## 2015-04-17 MED ORDER — METOPROLOL TARTRATE 1 MG/ML IV SOLN
INTRAVENOUS | Status: AC
  Administered 2015-04-17: 5 mg via INTRAVENOUS
  Filled 2015-04-17: qty 5

## 2015-04-17 MED ORDER — OXYCODONE HCL 5 MG PO TABS: 5.0000 mg | ORAL_TABLET | Freq: Once | ORAL | Status: DC | PRN

## 2015-04-17 MED ORDER — PHENYLEPHRINE HCL 10 MG/ML IJ SOLN
INTRAMUSCULAR | Status: DC | PRN
  Administered 2015-04-17 (×6): 100 ug via INTRAVENOUS
  Administered 2015-04-17: 200 ug via INTRAVENOUS
  Administered 2015-04-17: 100 ug via INTRAVENOUS

## 2015-04-17 MED ORDER — NEOMYCIN-POLYMYXIN B GU 40-200000 IR SOLN
Status: AC
  Filled 2015-04-17: qty 4

## 2015-04-17 MED ORDER — BUPIVACAINE HCL 0.5 % IJ SOLN
INTRAMUSCULAR | Status: DC | PRN
  Administered 2015-04-17: 30 mL

## 2015-04-17 MED ORDER — ONDANSETRON HCL 4 MG/2ML IJ SOLN
4.0000 mg | Freq: Once | INTRAMUSCULAR | Status: AC
  Administered 2015-04-17: 4 mg via INTRAVENOUS

## 2015-04-17 MED ORDER — KCL IN DEXTROSE-NACL 20-5-0.9 MEQ/L-%-% IV SOLN
INTRAVENOUS | Status: DC
  Filled 2015-04-17 (×5): qty 1000

## 2015-04-17 MED ORDER — NITROGLYCERIN 0.4 MG SL SUBL: 0.4000 mg | SUBLINGUAL_TABLET | SUBLINGUAL | Status: DC | PRN

## 2015-04-17 MED ORDER — ASPIRIN 81 MG PO CHEW
324.0000 mg | CHEWABLE_TABLET | Freq: Once | ORAL | Status: DC
  Administered 2015-04-17: 324 mg via ORAL

## 2015-04-17 MED ORDER — ACETAMINOPHEN 325 MG PO TABS: 650.0000 mg | ORAL_TABLET | Freq: Four times a day (QID) | ORAL | Status: DC | PRN

## 2015-04-17 MED ORDER — ASPIRIN 81 MG PO CHEW
81.0000 mg | CHEWABLE_TABLET | Freq: Every day | ORAL | Status: DC
  Administered 2015-04-18 – 2015-04-19 (×2): 81 mg via ORAL
  Filled 2015-04-17 (×2): qty 1
  Filled 2015-04-17: qty 3

## 2015-04-17 MED ORDER — NEOMYCIN-POLYMYXIN B GU 40-200000 IR SOLN
Status: DC | PRN
  Administered 2015-04-17: 4 mL

## 2015-04-17 MED ORDER — METOPROLOL TARTRATE 1 MG/ML IV SOLN
5.0000 mg | Freq: Once | INTRAVENOUS | Status: AC
  Administered 2015-04-17: 5 mg via INTRAVENOUS

## 2015-04-17 MED ORDER — HYDROMORPHONE HCL 1 MG/ML IJ SOLN
0.5000 mg | INTRAMUSCULAR | Status: DC | PRN
Start: 2015-04-17 — End: 2015-04-19

## 2015-04-17 MED ORDER — LACTATED RINGERS IV SOLN
INTRAVENOUS | Status: DC | PRN
  Administered 2015-04-17: 13:00:00 via INTRAVENOUS

## 2015-04-17 MED ORDER — PRAVASTATIN SODIUM 20 MG PO TABS
20.0000 mg | ORAL_TABLET | Freq: Every day | ORAL | Status: DC
  Administered 2015-04-17 – 2015-04-18 (×2): 20 mg via ORAL
  Filled 2015-04-17 (×2): qty 1

## 2015-04-17 MED ORDER — HYDRALAZINE HCL 20 MG/ML IJ SOLN: 10.0000 mg | Freq: Four times a day (QID) | INTRAMUSCULAR | Status: DC | PRN

## 2015-04-17 MED ORDER — LIDOCAINE HCL (CARDIAC) 20 MG/ML IV SOLN
INTRAVENOUS | Status: DC | PRN
  Administered 2015-04-17: 50 mg via INTRAVENOUS

## 2015-04-17 MED ORDER — ENOXAPARIN SODIUM 30 MG/0.3ML ~~LOC~~ SOLN
30.0000 mg | SUBCUTANEOUS | Status: DC
  Administered 2015-04-18 – 2015-04-19 (×2): 30 mg via SUBCUTANEOUS
  Filled 2015-04-17 (×2): qty 0.3

## 2015-04-17 MED ORDER — OXYCODONE HCL 5 MG PO TABS
5.0000 mg | ORAL_TABLET | ORAL | Status: DC | PRN
  Administered 2015-04-18 – 2015-04-19 (×2): 10 mg via ORAL
  Filled 2015-04-17 (×2): qty 2

## 2015-04-17 MED ORDER — ACETAMINOPHEN 650 MG RE SUPP: 650.0000 mg | Freq: Four times a day (QID) | RECTAL | Status: DC | PRN

## 2015-04-17 MED ORDER — ACETAMINOPHEN 10 MG/ML IV SOLN
INTRAVENOUS | Status: DC | PRN
  Administered 2015-04-17: 1000 mg via INTRAVENOUS

## 2015-04-17 MED ORDER — DILTIAZEM HCL ER COATED BEADS 120 MG PO CP24: 120.0000 mg | ORAL_CAPSULE | Freq: Every day | ORAL | Status: DC

## 2015-04-17 MED ORDER — SODIUM CHLORIDE 0.9 % IJ SOLN
INTRAMUSCULAR | Status: AC
  Filled 2015-04-17: qty 6

## 2015-04-17 MED ORDER — DIPHENHYDRAMINE HCL 12.5 MG/5ML PO ELIX: 12.5000 mg | ORAL_SOLUTION | ORAL | Status: DC | PRN

## 2015-04-17 MED ORDER — FENTANYL CITRATE (PF) 100 MCG/2ML IJ SOLN
INTRAMUSCULAR | Status: AC
  Administered 2015-04-17: 25 ug via INTRAVENOUS
  Filled 2015-04-17: qty 2

## 2015-04-17 MED ORDER — OXYCODONE HCL 5 MG/5ML PO SOLN: 5.0000 mg | Freq: Once | ORAL | Status: DC | PRN

## 2015-04-17 MED ORDER — ONDANSETRON HCL 4 MG/2ML IJ SOLN: 4.0000 mg | Freq: Four times a day (QID) | INTRAMUSCULAR | Status: DC | PRN

## 2015-04-17 MED ORDER — FENTANYL CITRATE (PF) 100 MCG/2ML IJ SOLN
25.0000 ug | INTRAMUSCULAR | Status: DC | PRN
  Administered 2015-04-17: 25 ug via INTRAVENOUS

## 2015-04-17 MED ORDER — BUPIVACAINE HCL (PF) 0.5 % IJ SOLN
INTRAMUSCULAR | Status: AC
  Filled 2015-04-17: qty 30

## 2015-04-17 MED ORDER — METOCLOPRAMIDE HCL 10 MG PO TABS: 5.0000 mg | ORAL_TABLET | Freq: Three times a day (TID) | ORAL | Status: DC | PRN

## 2015-04-17 MED ORDER — FLEET ENEMA 7-19 GM/118ML RE ENEM: 1.0000 | ENEMA | Freq: Once | RECTAL | Status: DC | PRN

## 2015-04-17 MED ORDER — DOCUSATE SODIUM 100 MG PO CAPS
100.0000 mg | ORAL_CAPSULE | Freq: Two times a day (BID) | ORAL | Status: DC
  Administered 2015-04-17 – 2015-04-19 (×4): 100 mg via ORAL
  Filled 2015-04-17 (×4): qty 1

## 2015-04-17 MED ORDER — CEFAZOLIN SODIUM-DEXTROSE 2-3 GM-% IV SOLR
2.0000 g | Freq: Four times a day (QID) | INTRAVENOUS | Status: AC
  Administered 2015-04-17 – 2015-04-18 (×3): 2 g via INTRAVENOUS
  Filled 2015-04-17 (×3): qty 50

## 2015-04-17 MED ORDER — BISACODYL 10 MG RE SUPP
10.0000 mg | Freq: Every day | RECTAL | Status: DC | PRN
  Administered 2015-04-19: 10 mg via RECTAL
  Filled 2015-04-17: qty 1

## 2015-04-17 MED ORDER — ONDANSETRON HCL 4 MG/2ML IJ SOLN
INTRAMUSCULAR | Status: DC | PRN
  Administered 2015-04-17: 4 mg via INTRAVENOUS

## 2015-04-17 MED ORDER — VITAMIN D 1000 UNITS PO TABS
2000.0000 [IU] | ORAL_TABLET | Freq: Every day | ORAL | Status: DC
  Administered 2015-04-18 – 2015-04-19 (×2): 2000 [IU] via ORAL
  Filled 2015-04-17 (×2): qty 2

## 2015-04-17 MED ORDER — PANTOPRAZOLE SODIUM 40 MG PO TBEC
40.0000 mg | DELAYED_RELEASE_TABLET | Freq: Every day | ORAL | Status: DC
  Administered 2015-04-18: 40 mg via ORAL
  Filled 2015-04-17: qty 1

## 2015-04-17 MED ORDER — ONDANSETRON HCL 4 MG/2ML IJ SOLN
INTRAMUSCULAR | Status: AC
  Administered 2015-04-17: 4 mg via INTRAVENOUS
  Filled 2015-04-17: qty 2

## 2015-04-17 MED ORDER — METOCLOPRAMIDE HCL 5 MG/ML IJ SOLN: 5.0000 mg | Freq: Three times a day (TID) | INTRAMUSCULAR | Status: DC | PRN

## 2015-04-17 MED ORDER — NITROGLYCERIN 0.4 MG SL SUBL
SUBLINGUAL_TABLET | SUBLINGUAL | Status: AC
  Filled 2015-04-17: qty 1

## 2015-04-17 MED ORDER — PROPOFOL 10 MG/ML IV BOLUS
INTRAVENOUS | Status: DC | PRN
  Administered 2015-04-17: 50 mg via INTRAVENOUS
  Administered 2015-04-17: 40 mg via INTRAVENOUS

## 2015-04-17 MED ORDER — ACETAMINOPHEN 500 MG PO TABS
1000.0000 mg | ORAL_TABLET | Freq: Four times a day (QID) | ORAL | Status: AC
  Administered 2015-04-17 – 2015-04-18 (×4): 1000 mg via ORAL
  Filled 2015-04-17 (×5): qty 2

## 2015-04-17 MED ORDER — DEXAMETHASONE SODIUM PHOSPHATE 4 MG/ML IJ SOLN
INTRAMUSCULAR | Status: DC | PRN
  Administered 2015-04-17: 5 mg via INTRAVENOUS

## 2015-04-17 MED ORDER — MAGNESIUM HYDROXIDE 400 MG/5ML PO SUSP
30.0000 mL | Freq: Every day | ORAL | Status: DC | PRN
  Administered 2015-04-19: 30 mL via ORAL
  Filled 2015-04-17: qty 30

## 2015-04-17 MED ORDER — ONDANSETRON HCL 4 MG PO TABS: 4.0000 mg | ORAL_TABLET | Freq: Four times a day (QID) | ORAL | Status: DC | PRN

## 2015-04-17 MED ORDER — FENTANYL CITRATE (PF) 100 MCG/2ML IJ SOLN
INTRAMUSCULAR | Status: DC | PRN
  Administered 2015-04-17 (×4): 25 ug via INTRAVENOUS

## 2015-04-17 MED ORDER — ALBUTEROL SULFATE (2.5 MG/3ML) 0.083% IN NEBU: 2.5000 mg | INHALATION_SOLUTION | Freq: Four times a day (QID) | RESPIRATORY_TRACT | Status: DC | PRN

## 2015-04-17 MED ORDER — NITROGLYCERIN 2 % TD OINT
0.5000 [in_us] | TOPICAL_OINTMENT | Freq: Four times a day (QID) | TRANSDERMAL | Status: DC
  Administered 2015-04-17 – 2015-04-18 (×4): 0.5 [in_us] via TOPICAL
  Filled 2015-04-17 (×4): qty 1

## 2015-04-17 SURGICAL SUPPLY — 60 items
BANDAGE ACE 4X5 VEL STRL LF (GAUZE/BANDAGES/DRESSINGS) ×6 IMPLANT
BANDAGE ACE 6X5 VEL STRL LF (GAUZE/BANDAGES/DRESSINGS) ×3 IMPLANT
BIT DRILL 2.5X2.75 QC CALB (BIT) ×3 IMPLANT
BIT DRILL 2.9 CANN QC NONSTRL (BIT) ×3 IMPLANT
BIT DRILL 3.5X5.5 QC CALB (BIT) ×3 IMPLANT
BIT DRILL CALIBRATED 2.7 (BIT) ×2 IMPLANT
BIT DRILL CALIBRATED 2.7MM (BIT) ×1
BLADE SURG SZ10 CARB STEEL (BLADE) ×6 IMPLANT
BNDG COHESIVE 4X5 TAN STRL (GAUZE/BANDAGES/DRESSINGS) ×3 IMPLANT
BNDG ESMARK 6X12 TAN STRL LF (GAUZE/BANDAGES/DRESSINGS) ×3 IMPLANT
BNDG PLASTER FAST 4X5 WHT LF (CAST SUPPLIES) ×12 IMPLANT
CANISTER SUCT 1200ML W/VALVE (MISCELLANEOUS) ×3 IMPLANT
CHLORAPREP W/TINT 26ML (MISCELLANEOUS) ×9 IMPLANT
DRAPE C-ARM XRAY 36X54 (DRAPES) ×3 IMPLANT
DRAPE C-ARMOR (DRAPES) ×3 IMPLANT
DRAPE INCISE IOBAN 66X45 STRL (DRAPES) ×3 IMPLANT
DRAPE U-SHAPE 47X51 STRL (DRAPES) ×3 IMPLANT
ELECT CAUTERY BLADE 6.4 (BLADE) ×3 IMPLANT
GAUZE PETRO XEROFOAM 1X8 (MISCELLANEOUS) ×3 IMPLANT
GAUZE SPONGE 4X4 12PLY STRL (GAUZE/BANDAGES/DRESSINGS) ×3 IMPLANT
GLOVE BIO SURGEON STRL SZ7 (GLOVE) ×3 IMPLANT
GLOVE BIO SURGEON STRL SZ8 (GLOVE) ×6 IMPLANT
GLOVE INDICATOR 8.0 STRL GRN (GLOVE) ×3 IMPLANT
GOWN STRL REUS W/ TWL LRG LVL3 (GOWN DISPOSABLE) ×1 IMPLANT
GOWN STRL REUS W/ TWL XL LVL3 (GOWN DISPOSABLE) ×1 IMPLANT
GOWN STRL REUS W/TWL LRG LVL3 (GOWN DISPOSABLE) ×2
GOWN STRL REUS W/TWL XL LVL3 (GOWN DISPOSABLE) ×2
HEMOVAC 400ML (MISCELLANEOUS)
K-WIRE ACE 1.6X6 (WIRE) ×6
KIT DRAIN HEMOVAC JP 7FR 400ML (MISCELLANEOUS) IMPLANT
KWIRE ACE 1.6X6 (WIRE) ×2 IMPLANT
LABEL OR SOLS (LABEL) IMPLANT
NS IRRIG 1000ML POUR BTL (IV SOLUTION) ×3 IMPLANT
PACK EXTREMITY ARMC (MISCELLANEOUS) ×3 IMPLANT
PAD ABD DERMACEA PRESS 5X9 (GAUZE/BANDAGES/DRESSINGS) ×6 IMPLANT
PAD CAST CTTN 4X4 STRL (SOFTGOODS) ×2 IMPLANT
PAD GROUND ADULT SPLIT (MISCELLANEOUS) ×3 IMPLANT
PAD PREP 24X41 OB/GYN DISP (PERSONAL CARE ITEMS) IMPLANT
PADDING CAST COTTON 4X4 STRL (SOFTGOODS) ×4
PLATE LOCK 7H 92 BILAT FIB (Plate) ×3 IMPLANT
SCREW ACE CAN 4.0 40M (Screw) ×6 IMPLANT
SCREW ACE CAN 4.0 42M (Screw) ×3 IMPLANT
SCREW ACE CAN 4.0 46M (Screw) ×3 IMPLANT
SCREW CORTICAL 3.5MM  20MM (Screw) ×2 IMPLANT
SCREW CORTICAL 3.5MM 20MM (Screw) ×1 IMPLANT
SCREW LOCK CORT STAR 3.5X10 (Screw) ×3 IMPLANT
SCREW LOCK CORT STAR 3.5X12 (Screw) ×6 IMPLANT
SCREW NON LOCKING LP 3.5 14MM (Screw) ×6 IMPLANT
SCREW NON LOCKING LP 3.5 16MM (Screw) ×6 IMPLANT
SLEEVE PROTECTION STRL DISP (MISCELLANEOUS) ×3 IMPLANT
SPONGE LAP 18X18 5 PK (GAUZE/BANDAGES/DRESSINGS) IMPLANT
STAPLER SKIN PROX 35W (STAPLE) ×3 IMPLANT
STOCKINETTE M/LG 89821 (MISCELLANEOUS) ×3 IMPLANT
STOCKINETTE STRL 6IN 960660 (GAUZE/BANDAGES/DRESSINGS) ×3 IMPLANT
STRAP SAFETY BODY (MISCELLANEOUS) ×3 IMPLANT
SUT PROLENE 4 0 PS 2 18 (SUTURE) ×3 IMPLANT
SUT VIC AB 0 CT1 36 (SUTURE) ×3 IMPLANT
SUT VIC AB 2-0 SH 27 (SUTURE) ×6
SUT VIC AB 2-0 SH 27XBRD (SUTURE) ×3 IMPLANT
SYRINGE 10CC LL (SYRINGE) ×3 IMPLANT

## 2015-04-17 NOTE — Anesthesia Preprocedure Evaluation (Signed)
Anesthesia Evaluation  Patient identified by MRN, date of birth, ID band Patient awake    Reviewed: Allergy & Precautions, H&P , NPO status , Patient's Chart, lab work & pertinent test results  History of Anesthesia Complications Negative for: history of anesthetic complications  Airway Mallampati: III  TM Distance: >3 FB Neck ROM: limited    Dental  (+) Poor Dentition, Chipped, Missing, Partial Lower, Partial Upper   Pulmonary neg shortness of breath, asthma ,    Pulmonary exam normal breath sounds clear to auscultation       Cardiovascular Exercise Tolerance: Good hypertension, (-) angina+ Past MI and +CHF  (-) DOE Normal cardiovascular exam Rhythm:regular Rate:Normal     Neuro/Psych negative neurological ROS  negative psych ROS   GI/Hepatic negative GI ROS, Neg liver ROS, neg GERD  ,  Endo/Other  negative endocrine ROS  Renal/GU negative Renal ROS  negative genitourinary   Musculoskeletal   Abdominal   Peds  Hematology negative hematology ROS (+)   Anesthesia Other Findings Past Medical History:   Hypertension                                                 Osteoporosis                                                 Hyperlipidemia                                               History of MI (myocardial infarction)                       History reviewed. No pertinent surgical history.  BMI    Body Mass Index   26.67 kg/m 2    Patient has medical clearance for this procedure  Reproductive/Obstetrics negative OB ROS                             Anesthesia Physical Anesthesia Plan  ASA: III  Anesthesia Plan: General LMA   Post-op Pain Management:    Induction:   Airway Management Planned:   Additional Equipment:   Intra-op Plan:   Post-operative Plan:   Informed Consent: I have reviewed the patients History and Physical, chart, labs and discussed the procedure  including the risks, benefits and alternatives for the proposed anesthesia with the patient or authorized representative who has indicated his/her understanding and acceptance.   Dental Advisory Given  Plan Discussed with: Anesthesiologist, CRNA and Surgeon  Anesthesia Plan Comments:         Anesthesia Quick Evaluation

## 2015-04-17 NOTE — H&P (Signed)
Subjective:  Chief complaint:  Fracture dislocation left ankle.  The patient is a 81 y.o. female who sustained an injury to the left ankle yesterday afternoon. Apparently, she was sitting at her table having a drink. She went to get up. As she turned, her foot apparently caught on something and she fell to the ground, injuring her left ankle. She was unable to get up, so she called her son. He came over, evaluated the ankle, and elected to call the EMS. The patient was brought to the emergency room where x-rays demonstrated the above-noted findings. Her fracture was reduced and splinted by the ER physician before she was admitted for medical stabilization in preparation for definitive management of the injury. The patient denies any associated injury or loss of consciousness associated with the injury, and denies any light-headedness, loss of consciousness, chest pain, or shortness of breath which might have contributed to the injury. The patient lives independently at home and has been doing quite well from a medical standpoint until this injury.  Patient Active Problem List   Diagnosis Date Noted  . Closed left ankle fracture 04/16/2015   Past Medical History  Diagnosis Date  . Hypertension   . Osteoporosis   . Hyperlipidemia   . History of MI (myocardial infarction)     History reviewed. No pertinent past surgical history.  No prescriptions prior to admission   No Known Allergies  Social History  Substance Use Topics  . Smoking status: Never Smoker   . Smokeless tobacco: Not on file  . Alcohol Use: No    Family History  Problem Relation Age of Onset  . Heart disease Mother   . Heart disease Father      Review of Systems: As noted above. The patient denies any chest pain, shortness of breath, nausea, vomiting, diarrhea, constipation, belly pain, blood in his/her stool, or burning with urination.  Objective: Temp:  [98.1 F (36.7 C)-98.7 F (37.1 C)] 98.2 F (36.8 C) (12/05  0802) Pulse Rate:  [77-112] 112 (12/05 0802) Resp:  [16-18] 16 (12/05 0802) BP: (125-181)/(52-90) 157/65 mmHg (12/05 0802) SpO2:  [95 %-99 %] 97 % (12/05 0802) Weight:  [55.934 kg (123 lb 5 oz)] 55.934 kg (123 lb 5 oz) (12/04 2005)  Physical Exam: General:  Alert, no acute distress Psychiatric:  Patient is competent for consent with normal mood and affect  Cardiovascular:  RRR  Respiratory:  Clear to auscultation. No wheezing. Non-labored breathing GI:  Abdomen is soft and non-tender Skin:  No lesions in the area of chief complaint Neurologic:  Sensation intact distally Lymphatic:  No axillary or cervical lymphadenopathy  Orthopedic Exam:  Orthopedic examination is limited to the left lower extremity and foot. The patient is in a posterior splint with a sugar tong supplement. The skin is intact at the proximal distal margins of the splint. She is able to dorsiflex and plantarflex her toes. Sensation is intact to light touch over the dorsomedial and dorsolateral aspects of the foot, as well as in the first webspace. She has excellent capillary refill to all digits.  Imaging Review: Recent x-rays of the left ankle are available for review.  The pre-and postreduction films demonstrate a displaced trimalleolar fracture dislocation of the left ankle with near anatomic reduction of the ankle. No significant degenerative changes or lytic lesions are identified.  Assessment: Trimalleolar fracture dislocation of left ankle.  Plan: The treatment options, including medical management versus open reduction and internal fixation, have been discussed in detail with  the patient and her family. The risks (including bleeding, infection, nerve and/or blood vessel injury, persistent or recurrent pain, malunion and/or nonunion, degenerative arthritis, stiffness, need for further surgery, blood clots, strokes, heart attacks or arrhythmias, pneumonia, etc.) and benefits of the surgical procedure were discussed.  The patient states her understanding and agrees to proceed with open reduction and internal fixation of the displaced left ankle fracture. A formal written consent will be obtained obtained by the nursing staff.

## 2015-04-17 NOTE — Op Note (Signed)
04/16/2015 - 04/17/2015  3:24 PM  Patient:   Anna Barrett  Pre-Op Diagnosis:    Trimalleolar fracture dislocation, left ankle.  Post-Op Diagnosis:   Same.  Procedure:   Open reduction and internal fixation of trimalleolar fracture dislocation, left ankle.  Surgeon:   Pascal Lux, MD  Assistant:   None  Anesthesia:    General LMA  Findings:   As above.  Complications:   None  EBL:   20 cc  Fluids:   700 cc crystalloid  UOP:   None  TT:   91 min at 250 mmHg  Drains:   None  Closure:   Staples  Implants:   Biomet ALPS 7-hole Composite plate and screws  Brief Clinical Note:   The patient is  A 81 year old female who sustained the above-noted injury late yesterday afternoon when she twisted her ankle awkwardly while getting up from the chair in her kitchen. She was brought to the emergency room where x-rays demonstrated the above-noted fracture. The fracture was reduced and splintedby the ER physician. She presents at this time for definitive management of her injury.  Procedure:   The patient was brought into the operating room. After adequate general laryngeal mask anesthesia was obtained, the left foot and lower leg were prepped with ChloraPrep solution, then draped sterilely. Preoperative antibiotics were administered. A timeout was performed to verify the appropriate surgical site before the limb was exsanguinated with an Esmarch and the thigh tourniquet inflated to 300 mmHg. Laterally, a 5-6 cm incision was made over the lateral aspect of the distal fibula. The incision was carried down through the subcutaneous tissues to expose the fracture site. The fracture hematoma was debrided before the fracture was reduced and temporarily secured using a bone clamp. A lag screw was placed in an anterior to posterior direction perpendicular to the fracture. A 7-hole Biomet ALPS composite plate was gently contoured then applied over the lateral aspect of the distal fibula. After  verifying its position fluoroscopically, it was secured using a 3.5 nonlocking cortical screw proximal to the fracture. Again the plate's position was verified fluoroscopically in AP and lateral projections before it was secured using additional bicortical screws proximally and three locking screws distally. The adequacy of fracture reduction and hardware position was verified fluoroscopically in AP and lateral projections and found to be excellent.  One of the more proximal screws was removed and replaced with a shorter screw as it appeared to be too long.  Attention was directed to the medial side. An approximately 3 cm  vertical incision was made over the medial malleolus. This incision also was carried down through the subcutaneous tissues to expose the fracture site. Care was taken to identify and protect the saphenous nerve and vein. The fracture hematoma again was removed before the fracture was reduced. Two  guidewires were placed obliquely across the fracture from distal to proximal into the distal tibial metaphysis. After verifying their positions fluoroscopically, each  Guidewire was over-reamed with the appropriate drill before a 4.0 mm partially threaded cancellous screw was placed over each guidewire and secured in lag fashion. Again the adequacy of fracture reduction, hardware position, and mortise restoration was verified in AP, lateral, and oblique projections and found to be excellent.   although the posterior malleolar fragment appeared to be reduced reasonably well, it was felt to be large enough that it would be best to stabilize it with a single cancellus screw placed in lag fashion from anterior to posterior. This  was accomplished through a short 1 cm incision placed over the anterior aspect of the distal fibula. Under fluoroscopic guidance, a guidewire was placed in the anterior to posterior direction to secure the posterior malleolar fragment. This guidewire was over-reamed before a 46  mm screw was inserted. After tightening the screw securely, the adequacy of screw position and posterior malleolar fragment fracture reduction was verified in AP and lateral projections. It was felt that the 46 mm screw was too long, so it was replaced with a 42 mm screw. Again the adequacy of hardware position, fracture reduction, and mortise restoration was verified fluoroscopically in AP, lateral, and mortise projections and found to be near anatomic.  Each wound was copiously irrigated with sterile saline solution. Laterally, the subcutaneous tissues were closed in two layers using 2-0 Vicryl interrupted sutures before the skin was closed using staples. Medially and anteriorly, the subcutaneous tissues were closed using 2-0 Vicryl interrupted sutures before the skin was closed using staples. Sterile bulky dressings were applied to all wounds before the foot and lower leg were placed into a posterior splint with a sugar tong supplement, maintaining the ankle in neutral dorsiflexion. The patient was then awakened and returned to the recovery room in satisfactory condition after tolerating the procedure well.

## 2015-04-17 NOTE — Clinical Social Work Placement (Signed)
   CLINICAL SOCIAL WORK PLACEMENT  NOTE  Date:  04/17/2015  Patient Details  Name: Anna Barrett MRN: [id number] Date of Birth: 11/14/1922  Clinical Social Work is seeking post-discharge placement for this patient at the [place] level of care (*CSW will initial, date and re-position this form in  chart as items are completed):  Yes   Patient/family provided with [place] Work Department's list of facilities offering this level of care within the geographic area requested by the patient (or if unable, by the patient's family).  Yes   Patient/family informed of their freedom to choose among providers that offer the needed level of care, that participate in Medicare, Medicaid or managed care program needed by the patient, have an available bed and are willing to accept the patient.  Yes   Patient/family informed of [facility]'s ownership interest in [facility] and [facility], as well as of the fact that they are under no obligation to receive care at these facilities.  PASRR submitted to EDS on 04/17/15     PASRR number received on 04/17/15     Existing PASRR number confirmed on       FL2 transmitted to all facilities in geographic area requested by pt/family on 04/17/15     FL2 transmitted to all facilities within larger geographic area on       Patient informed that his/her managed care company has contracts with or will negotiate with certain facilities, including the following:            Patient/family informed of bed offers received.  Patient chooses bed at       Physician recommends and patient chooses bed at      Patient to be transferred to   on  .  Patient to be transferred to facility by       Patient family notified on   of transfer.  Name of family member notified:        PHYSICIAN       Additional Comment:    _______________________________________________ Loralyn Freshwater, LCSW 04/17/2015, 3:53 PM

## 2015-04-17 NOTE — Progress Notes (Signed)
   04/17/15 0945  Clinical Encounter Type  Visited With Patient and family together  Visit Type Initial;Spiritual support  Referral From Nurse  Consult/Referral To Chaplain  Spiritual Encounters  Spiritual Needs Prayer  Visited with PT and family. Provided emotional support and prayer. Burley Saver [phone]

## 2015-04-17 NOTE — Care Management Note (Signed)
Case Management Note  Patient Details  Name: [name] MRN: [id number] Date of Birth: 1922-09-14  Subjective/Objective:                  Met with patient, her son, and her daughter prior to surgery today to discuss discharge planning. She has a rollator and two front-wheeled walkers available at home. She loaned out her wheelchair about 15 years ago. She has not required any DME in the home prior to this fall. She has been able to manage daily activities and drives. She has used [facility] in the past but has not preference now. Her daughter works for a [employer].   Action/Plan: List of home health providers left with patient and her family. RNCM will continue to follow.   Expected Discharge Date:                  Expected Discharge Plan:     In-House Referral:     Discharge planning Services  CM Consult  Post Acute Care Choice:  Durable Medical Equipment, Home Health Choice offered to:  Patient  DME Arranged:    DME Agency:     HH Arranged:    [place] Agency:     Status of Service:  In process, will continue to follow  Medicare Important Message Given:    Date Medicare IM Given:    Medicare IM give by:    Date Additional Medicare IM Given:    Additional Medicare Important Message give by:     If discussed at [place] of Stay Meetings, dates discussed:    Additional Comments:  Marshell Garfinkel, RN 04/17/2015, 10:50 AM

## 2015-04-17 NOTE — Transfer of Care (Signed)
Immediate Anesthesia Transfer of Care Note  Patient: Anna Barrett  Procedure(s) Performed: Procedure(s): OPEN REDUCTION INTERNAL FIXATION (ORIF) ANKLE FRACTURE (Left)  Patient Location: PACU  Anesthesia Type:General  Level of Consciousness: awake and patient cooperative  Airway & Oxygen Therapy: Patient Spontanous Breathing  Post-op Assessment: Report given to RN and Post -op Vital signs reviewed and stable  Post vital signs: Reviewed  Last Vitals:  Filed Vitals:   04/17/15 1223 04/17/15 1520  BP: 173/68 168/77  Pulse: 92 97  Temp: 37 C 36.7 C  Resp: 16 18    Complications: No apparent anesthesia complications

## 2015-04-17 NOTE — NC FL2 (Signed)
[place] LEVEL OF CARE SCREENING TOOL     IDENTIFICATION  Patient Name: Anna Barrett Birthdate: Feb 08, 1923 Sex: female Admission Date (Current Location): 04/16/2015  [place] and [place] Number:  [facility] )   Facility and Address:  [facility], [address], [place], [place] [address]      Provider Number: [id number]  Attending Physician Name and Address:  Corky Mull, MD  Relative Name and Phone Number:       Current Level of Care: Hospital Recommended Level of Care: [place] Prior Approval Number:    Date Approved/Denied:   PASRR Number:  ([id number])  Discharge Plan: SNF    Current Diagnoses: Patient Active Problem List   Diagnosis Date Noted  . Closed left ankle fracture 04/16/2015    Orientation ACTIVITIES/SOCIAL BLADDER RESPIRATION    Self, Time, Situation, Place  Active Continent Normal  BEHAVIORAL SYMPTOMS/MOOD NEUROLOGICAL BOWEL NUTRITION STATUS   (none )  (none ) Continent Diet (NPO for surgery )  PHYSICIAN VISITS COMMUNICATION OF NEEDS Height & Weight Skin  30 days Verbally 4\' 9"  (144.8 cm) 115 lbs. Surgical wounds (Incision: Left Leg)          AMBULATORY STATUS RESPIRATION    Assist extensive Normal      Personal Care Assistance Level of Assistance  Bathing, Feeding, Dressing Bathing Assistance: Limited assistance Feeding assistance: Independent Dressing Assistance: Limited assistance      Functional Limitations Info  Sight, Hearing, Speech Sight Info: Adequate Hearing Info: Adequate Speech Info: Adequate       SPECIAL CARE FACTORS FREQUENCY  PT (By licensed PT), OT (By licensed OT)     PT Frequency:  (5) OT Frequency:  (5)           Additional Factors Info  Code Status, Allergies Code Status Info:  (Full Code. ) Allergies Info:  (Iodine)           Current Medications (04/17/2015):  This is the current hospital active medication list Current  Facility-Administered Medications  Medication Dose Route Frequency Provider Last Rate Last Dose  . [MAR Hold] acetaminophen (TYLENOL) tablet 650 mg  650 mg Oral Q6H PRN Corky Mull, MD       Or  . Doug Sou Hold] acetaminophen (TYLENOL) suppository 650 mg  650 mg Rectal Q6H PRN Corky Mull, MD      . Doug Sou Hold] bisacodyl (DULCOLAX) suppository 10 mg  10 mg Rectal Daily PRN Corky Mull, MD      . dextrose 5 % and 0.9 % NaCl with KCl 20 mEq/L infusion   Intravenous Continuous Corky Mull, MD 75 mL/hr at 04/16/15 2222    . [MAR Hold] diltiazem (CARDIZEM CD) 24 hr capsule 120 mg  120 mg Oral Daily Henreitta Leber, MD   120 mg at 04/17/15 0537  . [MAR Hold] docusate sodium (COLACE) capsule 100 mg  100 mg Oral BID Corky Mull, MD   100 mg at 04/16/15 2228  . fentaNYL (SUBLIMAZE) injection 25-50 mcg  25-50 mcg Intravenous Q5 min PRN Andria Frames, MD      . Doug Sou Hold] HYDROmorphone (DILAUDID) injection 0.5 mg  0.5 mg Intravenous Q2H PRN Corky Mull, MD   0.5 mg at 04/17/15 1020  . [MAR Hold] magnesium hydroxide (MILK OF MAGNESIA) suspension 30 mL  30 mL Oral Daily PRN Corky Mull, MD      . oxyCODONE (Oxy IR/ROXICODONE) immediate release tablet 5 mg  5 mg Oral Once PRN Andria Frames, MD       Or  . oxyCODONE (ROXICODONE) 5 MG/5ML solution 5 mg  5 mg Oral Once PRN Andria Frames, MD      . Doug Sou Hold] pantoprazole (PROTONIX) injection 40 mg  40 mg Intravenous QHS Corky Mull, MD   40 mg at 04/16/15 2230  . [MAR Hold] pravastatin (PRAVACHOL) tablet 20 mg  20 mg Oral q1800 Henreitta Leber, MD      . sodium chloride 0.9 % injection           . [MAR Hold] sodium phosphate (FLEET) 7-19 GM/118ML enema 1 enema  1 enema Rectal Once PRN Corky Mull, MD      . Doug Sou Hold] torsemide [facility]) tablet 5 mg  5 mg Oral Daily Henreitta Leber, MD   5 mg at 04/17/15 [id number]     Discharge Medications: Please see discharge summary for a list of discharge medications.  Relevant Imaging  Results:  Relevant Lab Results:  Recent Labs    Additional Information  (SSN: [id number])  Loralyn Freshwater, LCSW

## 2015-04-17 NOTE — Clinical Social Work Note (Signed)
Clinical Social Work Assessment  Patient Details  Name: Anna Barrett MRN: [id number] Date of Birth: 01/20/1923  Date of referral:  04/17/15               Reason for consult:  Facility Placement                Permission sought to share information with:  Chartered certified accountant granted to share information::  Yes, Verbal Permission Granted  Name::      Anna Barrett::   [place]   Relationship::     Contact Information:     Housing/Transportation Living arrangements for the past 2 months:  [place] of Information:  Power of [place], Adult Children Patient Interpreter Needed:  None Criminal Activity/Legal Involvement Pertinent to Current Situation/Hospitalization:  No - Comment as needed Significant Relationships:  Adult Children Lives with:  Self Do you feel safe going back to the place where you live?  Yes Need for family participation in patient care:  Yes (Comment)  Care giving concerns: Patient lives alone [place].    Facilities manager / plan: Patient had surgery with Dr. Roland Rack today for an ankle fracture. Clinical Social Worker (CSW) met with patient's son Anna Barrett and daughter Anna Barrett [phone]. Patient was out of the room for surgery. CSW introduced self and explained role of CSW department. Per daughter she and Anna Barrett share HPOA. Per Anna Barrett patient lives alone in [place] and was completely independent prior to this fall. Per daughter patient still drives and takes care of her own errands. Daughter reported that she lives in [place] and son lives in [place]. CSW explained that patient will work with PT after surgery and they will recommend home health or SNF. Daughter reported that she prefers SNF. SNF list was provided. Daughter and son are agreeable to SNF search in [place].   FL2 complete and faxed out. CSW will continue to follow and assist as needed.    Employment status:  Retired Designer, industrial/product PT Recommendations:  Not assessed at this time Information / Referral to community resources:  [place]  Patient/Family's Response to care: Daughter and son are agreeable to [facility].   Patient/Family's Understanding of and Emotional Response to Diagnosis, Current Treatment, and Prognosis: Daughter and son were pleasant and thanked CSW for visit.   Emotional Assessment Appearance:    Attitude/Demeanor/Rapport:  Unable to Assess Affect (typically observed):  Unable to Assess Orientation:  Oriented to Self, Oriented to Place, Oriented to  Time, Oriented to Situation Alcohol / Substance use:  Not Applicable Psych involvement (Current and /or in the community):  No (Comment)  Discharge Needs  Concerns to be addressed:  Discharge Planning Concerns Readmission within the last 30 days:  No Current discharge risk:  Dependent with Mobility Barriers to Discharge:  Continued Medical Work up   Elwyn Reach 04/17/2015, 3:54 PM

## 2015-04-17 NOTE — Anesthesia Procedure Notes (Signed)
Procedure Name: LMA Insertion Date/Time: 04/17/2015 12:58 PM Performed by: Justus Memory Pre-anesthesia Checklist: Patient identified, Emergency Drugs available, Suction available and Patient being monitored Patient Re-evaluated:Patient Re-evaluated prior to inductionOxygen Delivery Method: Circle system utilized Preoxygenation: Pre-oxygenation with 100% oxygen Intubation Type: IV induction Ventilation: Mask ventilation without difficulty LMA: LMA inserted LMA Size: 3.0 Number of attempts: 1 Dental Injury: Teeth and Oropharynx as per pre-operative assessment

## 2015-04-17 NOTE — Progress Notes (Signed)
[facility] NAME: Anna Barrett    MR#:  [id number]  DATE OF BIRTH:  1923/02/23  SUBJECTIVE:  CHIEF COMPLAINT:   Chief Complaint  Patient presents with  . Ankle Pain  . Fall   -Patient presented with fall and left ankle fracture. For surgery today. - complaining of pain at the left ankle.  REVIEW OF SYSTEMS:  Review of Systems  Constitutional: Negative for fever and chills.  HENT: Negative for ear discharge, ear pain and tinnitus.   Respiratory: Negative for cough, shortness of breath and wheezing.   Cardiovascular: Negative for chest pain and palpitations.  Gastrointestinal: Negative for nausea, vomiting, abdominal pain, diarrhea and constipation.  Genitourinary: Negative for dysuria.  Musculoskeletal: Positive for joint pain.       Left ankle pain  Neurological: Negative for dizziness, seizures and headaches.    DRUG ALLERGIES:  No Known Allergies  VITALS:  Blood pressure 157/65, pulse 112, temperature 98.2 F (36.8 C), temperature source Oral, resp. rate 16, height 4\' 9"  (1.448 m), weight 55.934 kg (123 lb 5 oz), SpO2 97 %.  PHYSICAL EXAMINATION:  Physical Exam  GENERAL:  81 y.o.-year-old patient lying in the bed with no acute distress.  EYES: Pupils equal, round, reactive to light and accommodation. No scleral icterus. Extraocular muscles intact.  HEENT: Head atraumatic, normocephalic. Oropharynx and nasopharynx clear.  NECK:  Supple, no jugular venous distention. No thyroid enlargement, no tenderness.  LUNGS: Normal breath sounds bilaterally, no wheezing, rales,rhonchi or crepitation. No use of accessory muscles of respiration.  CARDIOVASCULAR: S1, S2 normal. No murmurs, rubs, or gallops.  ABDOMEN: Soft, nontender, nondistended. Bowel sounds present. No organomegaly or mass.  EXTREMITIES: Left ankle immobilized in a soft cast. No pedal edema, cyanosis, or clubbing. Good dorsalis pedis pulses palpable on the  right leg NEUROLOGIC: Cranial nerves II through XII are intact. Muscle strength 5/5 in all extremities except left leg as mobility limited due to fracture and pain. Sensation intact. Gait not checked.  PSYCHIATRIC: The patient is alert and oriented x 3.  SKIN: No obvious rash, lesion, or ulcer.    LABORATORY PANEL:   CBC  Recent Labs Lab 04/16/15 1904  WBC 10.2  HGB 12.4  HCT 37.0  PLT 274   ------------------------------------------------------------------------------------------------------------------  Chemistries   Recent Labs Lab 04/16/15 1617  NA 138  K 4.4  CL 107  CO2 26  GLUCOSE 117*  BUN 27*  CREATININE 0.99  CALCIUM 8.7*   ------------------------------------------------------------------------------------------------------------------  Cardiac Enzymes No results for input(s): TROPONINI in the last 168 hours. ------------------------------------------------------------------------------------------------------------------  RADIOLOGY:  Dg Chest 1 View  04/16/2015  CLINICAL DATA:  Preop left ankle fracture EXAM: CHEST 1 VIEW COMPARISON:  09/15/2013 FINDINGS: Chronic interstitial markings. Mild right basilar scarring. No focal consolidation. No pleural effusion or pneumothorax. Cardiomegaly. IMPRESSION: No evidence of acute cardiopulmonary disease. Electronically Signed   By: Julian Hy M.D.   On: 04/16/2015 17:48   Dg Ankle 2 Views Left  04/16/2015  CLINICAL DATA:  Status post reduction of a left ankle fracture/dislocation. EXAM: LEFT ANKLE - 2 VIEW COMPARISON:  Earlier today. FINDINGS: A trimalleolar fracture is again demonstrated. There is better visualization of the posterior malleolus fracture fragment, posteriorly displaced. There is persistent posterior displacement of the talus relative to the tibia with reduction of the previously seen lateral displacement of the talus relative to the tibia. There is also improved position and alignment of the  medial and lateral malleolus fracture fragments  with mild residual lateral displacement and lateral angulation of the distal fragments. IMPRESSION: Overall improved position and alignment of a trimalleolar fracture with persistent posterior subluxation of the talus relative to the distal tibia and interval posterior displacement of the posterior fragment of the posterior malleolus fracture. Electronically Signed   By: Claudie Revering M.D.   On: 04/16/2015 18:13   Dg Ankle 2 Views Left  04/16/2015  CLINICAL DATA:  [place] today and injured left ankle. EXAM: LEFT ANKLE - 2 VIEW COMPARISON:  None. FINDINGS: The talus is dislocated posteriorly and laterally compared to the tibia. There is a displaced and comminuted fracture through the medial malleolus and also likely through the posterior malleolus of the tibia. There is a markedly displaced and angulated fracture of the distal fibular shaft. The talus is intact. No definite foot fractures. IMPRESSION: Fracture dislocation at the ankle as above. Electronically Signed   By: Marijo Sanes M.D.   On: 04/16/2015 17:11    EKG:   Orders placed or performed during the hospital encounter of 04/16/15  . ED EKG  . ED EKG    ASSESSMENT AND PLAN:   81 year old female with past medical history of hypertension, hyperlipidemia, history of previous MI, osteoporosis who presents to the hospital after a fall and noted to have a left ankle fracture.  #1 left ankle fracture- secondary to the mechanical fall. Also has been having some bilateral feet numbness. Will check B12 levels. -Appreciate orthopedics input. For surgery today. -Patient is deemed to be a low risk surgical candidate at this time. -Continue care as per orthopedics. Pain Management and physical therapy after surgery.  #2 hypertension-continue Cardizem.  #3 hyperlipidemia-continue lovastatin.  #4 osteoporosis-continue vitamin D supplements.  #5 h/o chf- stable, on torsemide low dose Not in acute chf  now  #6 DVT prophylaxis- will be started after surgery  Discussed with family at bedside   All the records are reviewed and case discussed with Care Management/Social Workerr. Management plans discussed with the patient, family and they are in agreement.  CODE STATUS: Full code  TOTAL TIME TAKING CARE OF THIS PATIENT: 36 minutes.   POSSIBLE D/C IN 2-3 DAYS, DEPENDING ON CLINICAL CONDITION.   Gladstone Lighter M.D on 04/17/2015 at 10:46 AM  Between 7am to 6pm - Pager - [phone]  After 6pm go to [email or web address] - password [email or web address] [facility]  Office  [phone]  CC: Primary care physician; Kirk Ruths., MD

## 2015-04-18 ENCOUNTER — Encounter: Payer: Self-pay | Admitting: Surgery

## 2015-04-18 ENCOUNTER — Inpatient Hospital Stay
Admit: 2015-04-18 | Discharge: 2015-04-18 | Disposition: A | Discharge status: Home / Self Care | Payer: Medicare Other | Attending: Internal Medicine | Admitting: Internal Medicine

## 2015-04-18 LAB — TROPONIN I: Troponin I: 0.06 ng/mL — ABNORMAL HIGH (ref <0.031)

## 2015-04-18 LAB — BASIC METABOLIC PANEL
ANION GAP: 4 — AB (ref 5–15)
BUN: 15 mg/dL (ref 6–20)
CHLORIDE: 108 mmol/L (ref 101–111)
CO2: 25 mmol/L (ref 22–32)
Calcium: 7.8 mg/dL — ABNORMAL LOW (ref 8.9–10.3)
Creatinine, Ser: 0.92 mg/dL (ref 0.44–1.00)
GFR calc Af Amer: >60 mL/min (ref >60)
GFR, EST NON AFRICAN AMERICAN: 53 mL/min — AB (ref >60)
Glucose, Bld: 118 mg/dL — ABNORMAL HIGH (ref 65–99)
POTASSIUM: 4.1 mmol/L (ref 3.5–5.1)
SODIUM: 137 mmol/L (ref 135–145)

## 2015-04-18 LAB — CBC
HCT: 29.6 % — ABNORMAL LOW (ref 35.0–47.0)
HEMOGLOBIN: 10.2 g/dL — AB (ref 12.0–16.0)
MCH: 30.9 pg (ref 26.0–34.0)
MCHC: 34.3 g/dL (ref 32.0–36.0)
MCV: 89.8 fL (ref 80.0–100.0)
PLATELETS: 232 10*3/uL (ref 150–440)
RBC: 3.29 MIL/uL — AB (ref 3.80–5.20)
RDW: 12.7 % (ref 11.5–14.5)
WBC: 7.7 10*3/uL (ref 3.6–11.0)

## 2015-04-18 MED ORDER — SENNA 8.6 MG PO TABS: 1.0000 | ORAL_TABLET | Freq: Two times a day (BID) | ORAL | Status: DC | PRN

## 2015-04-18 NOTE — Anesthesia Postprocedure Evaluation (Signed)
Anesthesia Post Note  Patient: Anna Barrett  Procedure(s) Performed: Procedure(s) (LRB): OPEN REDUCTION INTERNAL FIXATION (ORIF) ANKLE FRACTURE (Left)  Patient location during evaluation: PACU Anesthesia Type: General Level of consciousness: awake and alert Pain management: pain level controlled Vital Signs Assessment: post-procedure vital signs reviewed and stable Respiratory status: spontaneous breathing, nonlabored ventilation, respiratory function stable and patient connected to nasal cannula oxygen Cardiovascular status: blood pressure returned to baseline and stable Postop Assessment: no signs of nausea or vomiting Anesthetic complications: yes Comments: Possible cardiac event in the PACU.  Patient stable at time of discharge with no new EKG changes.   Medicine team to follow up on cardiac workup    Last Vitals:  Filed Vitals:   04/18/15 0008 04/18/15 0431  BP: 128/54 142/59  Pulse: 74 76  Temp:  36.4 C  Resp:  16    Last Pain:  Filed Vitals:   04/18/15 0706  PainSc: Asleep                 Precious Haws [place]

## 2015-04-18 NOTE — Care Management Important Message (Signed)
Important Message  Patient Details  Name: Anna Barrett MRN: [id number] Date of Birth: 27-May-1922   Medicare Important Message Given:  Yes    [name] 04/18/2015, 10:44 AM
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
## 2015-04-18 NOTE — Clinical Documentation Improvement (Signed)
Internal Medicine Orthopedic  Abnormal Lab/Test Results:  H&H had dropped from (12.9/38.6)   to  (10.2/29.6) following surgery. Please provide a diagnosis associated with the abnormal lab value and document findings in next progress note and include in discharge summary if applicable. Do not document in BPA drop down box. Thank you!  Possible Clinical Conditions associated with below indicators  Other Condition  Cannot Clinically Determine  Supporting Information:  Serially monitoring H&H  Please exercise your independent, professional judgment when responding. A specific answer is not anticipated or expected.  Thank You,  Zoila Shutter RN, BSN, [place] [phone]; Cell: [phone]

## 2015-04-18 NOTE — Progress Notes (Signed)
*  PRELIMINARY RESULTS* Echocardiogram 2D Echocardiogram has been performed.  Anna Barrett 04/18/2015, 10:23 AM

## 2015-04-18 NOTE — Progress Notes (Signed)
  Subjective: 1 Day Post-Op Procedure(s) (LRB): OPEN REDUCTION INTERNAL FIXATION (ORIF) ANKLE FRACTURE (Left) Patient reports pain as mild.   Patient is well, and has had no acute complaints or problems Plan for home is undetermined.  Pt is doing extremely well but lives at home by herself. Negative for chest pain and shortness of breath Fever: no Gastrointestinal:Negative for nausea and vomiting  Objective: Vital signs in last 24 hours: Temp:  [97.4 F (36.3 C)-98.6 F (37 C)] 98.1 F (36.7 C) (12/06 0745) Pulse Rate:  [70-125] 74 (12/06 0745) Resp:  [16-24] 16 (12/06 0745) BP: (128-182)/(43-92) 148/61 mmHg (12/06 0745) SpO2:  [87 %-100 %] 100 % (12/06 0745) Weight:  [52.164 kg (115 lb)] 52.164 kg (115 lb) (12/05 1223)  Intake/Output from previous day:  Intake/Output Summary (Last 24 hours) at 04/18/15 0935 Last data filed at 04/18/15 0635  Gross per 24 hour  Intake   1300 ml  Output    595 ml  Net    705 ml    Intake/Output this shift:    Labs:  Recent Labs  04/16/15 1617 04/16/15 1904 04/18/15 0421  HGB 12.9 12.4 10.2*    Recent Labs  04/16/15 1904 04/18/15 0421  WBC 10.2 7.7  RBC 4.09 3.29*  HCT 37.0 29.6*  PLT 274 232    Recent Labs  04/16/15 1617 04/18/15 0421  NA 138 137  K 4.4 4.1  CL 107 108  CO2 26 25  BUN 27* 15  CREATININE 0.99 0.92  GLUCOSE 117* 118*  CALCIUM 8.7* 7.8*   No results for input(s): LABPT, INR in the last 72 hours.   EXAM General - Patient is Alert, Appropriate and Oriented Extremity - Neurologically intact ABD soft Sensation intact distally Dorsiflexion/Plantar flexion intact Dressing/Incision - clean, dry, no drainage to the post-op splint. Motor Function - intact, moving foot and toes well on exam.   Abdomen soft on exam with normal BS. Pt able to extend and flex toes, sensation to light touch intact throughout.  Past Medical History  Diagnosis Date  . Hypertension   . Osteoporosis   . Hyperlipidemia    . History of MI (myocardial infarction)   . Myocardial infarction ([place]) 1962    Assessment/Plan: 1 Day Post-Op Procedure(s) (LRB): OPEN REDUCTION INTERNAL FIXATION (ORIF) ANKLE FRACTURE (Left) Active Problems:   Closed left ankle fracture  Estimated body mass index is 24.88 kg/(m^2) as calculated from the following:   Height as of this encounter: 4\' 9"  (1.448 m).   Weight as of this encounter: 52.164 kg (115 lb). Advance diet Up with therapy D/C IV fluids when tolerating PO intake  Pt is doing well this AM, does not complain of pain in her left ankle. Pt to work with PT on ambulation.  Spoke with the patient about potential discharge location options. Labs reviewed.  Troponin increased to 0.06.  Denies chest pain.  Internal med notified.  DVT Prophylaxis - Lovenox, Foot Pumps and TED hose Non weight-Bearing to left leg  J. Cameron Proud, PA-C [facility] 04/18/2015, 9:35 AM

## 2015-04-18 NOTE — Progress Notes (Signed)
Clinical Education officer, museum (CSW) met with patient to present bed offers. Patient's daughter was also at bedside. Patient chose [facility]. Kim admissions coordinator at [facility] is aware of accepted bed offer.   Blima Rich, [place] [phone]

## 2015-04-18 NOTE — Progress Notes (Signed)
Dr. Carlyle Dolly notified of Troponin of 0.06 this morning.

## 2015-04-18 NOTE — Plan of Care (Signed)
Problem: Safety: Goal: Ability to remain free from injury will improve Outcome: Completed/Met Date Met:  04/18/15 Pt has remained fall free this shift.  Problem: Pain Managment: Goal: General experience of comfort will improve Outcome: Progressing Pain control by oral tylenol very minimal pain this shift.

## 2015-04-18 NOTE — Progress Notes (Signed)
[facility] NAME: Marguita Chevrier    MR#:  [id number]  DATE OF BIRTH:  1922-09-08  SUBJECTIVE:  CHIEF COMPLAINT:   Chief Complaint  Patient presents with  . Ankle Pain  . Fall   -s/p left ankle trimalleolar fracture repair yesterday, POD #1 today - patient had chest pain and tachycardia yesterday after surgery, symptoms resolved now - HR elevated to 116 with physical therapy, will order tele monitoring now   REVIEW OF SYSTEMS:  Review of Systems  Constitutional: Negative for fever and chills.  HENT: Negative for ear discharge, ear pain and tinnitus.   Respiratory: Negative for cough, shortness of breath and wheezing.   Cardiovascular: Negative for chest pain and palpitations.  Gastrointestinal: Negative for nausea, vomiting, abdominal pain, diarrhea and constipation.  Genitourinary: Negative for dysuria.  Musculoskeletal: Positive for joint pain.       Left ankle pain  Neurological: Negative for dizziness, seizures and headaches.    DRUG ALLERGIES:   Allergies  Allergen Reactions  . Iodine Other (See Comments)    VITALS:  Blood pressure 148/61, pulse 116, temperature 98.1 F (36.7 C), temperature source Oral, resp. rate 16, height 4\' 9"  (1.448 m), weight 52.164 kg (115 lb), SpO2 95 %.  PHYSICAL EXAMINATION:  Physical Exam  GENERAL:  81 y.o.-year-old patient lying in the bed with no acute distress.  EYES: Pupils equal, round, reactive to light and accommodation. No scleral icterus. Extraocular muscles intact.  HEENT: Head atraumatic, normocephalic. Oropharynx and nasopharynx clear.  NECK:  Supple, no jugular venous distention. No thyroid enlargement, no tenderness.  LUNGS: Normal breath sounds bilaterally, no wheezing, rales,rhonchi or crepitation. No use of accessory muscles of respiration.  CARDIOVASCULAR: S1, S2 normal. No murmurs, rubs, or gallops.  ABDOMEN: Soft, nontender, nondistended. Bowel sounds  present. No organomegaly or mass.  EXTREMITIES: Left ankle immobilized in a soft cast. No pedal edema, cyanosis, or clubbing. Good dorsalis pedis pulses palpable on the right leg NEUROLOGIC: Cranial nerves II through XII are intact. Muscle strength 5/5 in all extremities except left leg as mobility limited due to fracture and pain. Sensation intact. Gait not checked.  PSYCHIATRIC: The patient is alert and oriented x 3.  SKIN: No obvious rash, lesion, or ulcer.    LABORATORY PANEL:   CBC  Recent Labs Lab 04/18/15 0421  WBC 7.7  HGB 10.2*  HCT 29.6*  PLT 232   ------------------------------------------------------------------------------------------------------------------  Chemistries   Recent Labs Lab 04/18/15 0421  NA 137  K 4.1  CL 108  CO2 25  GLUCOSE 118*  BUN 15  CREATININE 0.92  CALCIUM 7.8*   ------------------------------------------------------------------------------------------------------------------  Cardiac Enzymes  Recent Labs Lab 04/18/15 0421  TROPONINI 0.06*   ------------------------------------------------------------------------------------------------------------------  RADIOLOGY:  Dg Chest 1 View  04/16/2015  CLINICAL DATA:  Preop left ankle fracture EXAM: CHEST 1 VIEW COMPARISON:  09/15/2013 FINDINGS: Chronic interstitial markings. Mild right basilar scarring. No focal consolidation. No pleural effusion or pneumothorax. Cardiomegaly. IMPRESSION: No evidence of acute cardiopulmonary disease. Electronically Signed   By: Julian Hy M.D.   On: 04/16/2015 17:48   Dg Ankle 2 Views Left  04/17/2015  CLINICAL DATA:  Postop fracture reduction and fixation EXAM: DG C-ARM 61-120 MIN; LEFT ANKLE - 2 VIEW COMPARISON:  04/16/2015 FINDINGS: The patient is status post open reduction internal fixation of left ankle fracture. Metallic fixation plate and screws are noted in distal fibula. Two metallic fixation screws are noted in distal tibia medial  malleolus. Ankle mortise is restored. A third fixation screw is noted in distal tibia. IMPRESSION: Status post open reduction internal fixation of left ankle fracture. Metallic fixation plate and screws in distal fibula. Metallic fixation screws in distal tibia. There is anatomic alignment. Fluoroscopy time 43 seconds.  Please see the operative report Electronically Signed   By: Lahoma Crocker M.D.   On: 04/17/2015 15:10   Dg Ankle 2 Views Left  04/16/2015  CLINICAL DATA:  Status post reduction of a left ankle fracture/dislocation. EXAM: LEFT ANKLE - 2 VIEW COMPARISON:  Earlier today. FINDINGS: A trimalleolar fracture is again demonstrated. There is better visualization of the posterior malleolus fracture fragment, posteriorly displaced. There is persistent posterior displacement of the talus relative to the tibia with reduction of the previously seen lateral displacement of the talus relative to the tibia. There is also improved position and alignment of the medial and lateral malleolus fracture fragments with mild residual lateral displacement and lateral angulation of the distal fragments. IMPRESSION: Overall improved position and alignment of a trimalleolar fracture with persistent posterior subluxation of the talus relative to the distal tibia and interval posterior displacement of the posterior fragment of the posterior malleolus fracture. Electronically Signed   By: Claudie Revering M.D.   On: 04/16/2015 18:13   Dg Ankle 2 Views Left  04/16/2015  CLINICAL DATA:  [place] today and injured left ankle. EXAM: LEFT ANKLE - 2 VIEW COMPARISON:  None. FINDINGS: The talus is dislocated posteriorly and laterally compared to the tibia. There is a displaced and comminuted fracture through the medial malleolus and also likely through the posterior malleolus of the tibia. There is a markedly displaced and angulated fracture of the distal fibular shaft. The talus is intact. No definite foot fractures. IMPRESSION: Fracture  dislocation at the ankle as above. Electronically Signed   By: Marijo Sanes M.D.   On: 04/16/2015 17:11   Dg Chest Port 1 View  04/17/2015  CLINICAL DATA:  Dyspnea, recent ankle surgery EXAM: PORTABLE CHEST - 1 VIEW COMPARISON:  04/16/2015 FINDINGS: Cardiac shadow is enlarged. The thoracic aorta is again tortuous in nature. Lungs are well aerated bilaterally. Mild increased density is noted in the left lung base consistent with atelectasis and small effusion. No other focal infiltrate is noted. IMPRESSION: Left basilar atelectasis and small effusion. Continued followup is recommended. Electronically Signed   By: Inez Catalina M.D.   On: 04/17/2015 18:40   Dg C-arm 61-120 Min  04/17/2015  CLINICAL DATA:  Postop fracture reduction and fixation EXAM: DG C-ARM 61-120 MIN; LEFT ANKLE - 2 VIEW COMPARISON:  04/16/2015 FINDINGS: The patient is status post open reduction internal fixation of left ankle fracture. Metallic fixation plate and screws are noted in distal fibula. Two metallic fixation screws are noted in distal tibia medial malleolus. Ankle mortise is restored. A third fixation screw is noted in distal tibia. IMPRESSION: Status post open reduction internal fixation of left ankle fracture. Metallic fixation plate and screws in distal fibula. Metallic fixation screws in distal tibia. There is anatomic alignment. Fluoroscopy time 43 seconds.  Please see the operative report Electronically Signed   By: Lahoma Crocker M.D.   On: 04/17/2015 15:10    EKG:   Orders placed or performed during the hospital encounter of 04/16/15  . EKG 12-Lead  . EKG 12-Lead    ASSESSMENT AND PLAN:   81 year old female with past medical history of hypertension, hyperlipidemia, history of previous MI, osteoporosis who presents to the hospital after a  fall and noted to have a left ankle fracture.  #1 Left ankle fracture- secondary to the mechanical fall. Also has been having some bilateral feet numbness. Will check B12  levels. -Appreciate orthopedics input. POD #1 today. -Postoperative course immediately complicated with chest pain. 2 sets of troponins negative, third set slightly elevated likely from demand ischemia. Patient is currently chest pain-free, echo is normal. No wall motion abnormalities noted. Patient is okay to participate with physical therapy. -On telemetry monitoring -Continue care as per orthopedics. Pain Management and physical therapy  #2 postoperative acute anemia-likely blood loss during surgery and also dilutional. -Monitoring. No acute indication for any transfusion.  #3 hypertension-continue Cardizem. HR elevated with physical activity today - monitor, adjust cardizem dose if needed - tele monitoring  #3 hyperlipidemia-continue lovastatin.  #4 osteoporosis-continue vitamin D supplements.  #5 h/o chf- stable, on torsemide low dose Not in acute chf now, IV fluids discontinued - ECHO with normal EF  #6 DVT prophylaxis- started on Lovenox  Discussed with family at bedside   All the records are reviewed and case discussed with Care Management/Social Workerr. Management plans discussed with the patient, family and they are in agreement.  CODE STATUS: Full code  TOTAL TIME TAKING CARE OF THIS PATIENT: 36 minutes.   POSSIBLE D/C IN 2-3 DAYS, DEPENDING ON CLINICAL CONDITION.   Perel Hauschild M.D on 04/18/2015 at 2:53 PM  Between 7am to 6pm - Pager - [phone]  After 6pm go to [email or web address] - password [email or web address] [facility]  Office  [phone]  CC: Primary care physician; Kirk Ruths., MD

## 2015-04-19 ENCOUNTER — Encounter
Admission: RE | Admit: 2015-04-19 | Discharge: 2015-04-19 | Disposition: A | Discharge status: Home / Self Care | Payer: Medicare Other | Source: Ambulatory Visit | Attending: Internal Medicine | Admitting: Internal Medicine

## 2015-04-19 DIAGNOSIS — D649 Anemia, unspecified: Secondary | ICD-10-CM | POA: Insufficient documentation

## 2015-04-19 LAB — CBC
HEMATOCRIT: 29.2 % — AB (ref 35.0–47.0)
HEMOGLOBIN: 10 g/dL — AB (ref 12.0–16.0)
MCH: 30.8 pg (ref 26.0–34.0)
MCHC: 34.1 g/dL (ref 32.0–36.0)
MCV: 90.4 fL (ref 80.0–100.0)
Platelets: 225 10*3/uL (ref 150–440)
RBC: 3.23 MIL/uL — ABNORMAL LOW (ref 3.80–5.20)
RDW: 12.6 % (ref 11.5–14.5)
WBC: 5.1 10*3/uL (ref 3.6–11.0)

## 2015-04-19 LAB — BASIC METABOLIC PANEL
ANION GAP: 4 — AB (ref 5–15)
BUN: 19 mg/dL (ref 6–20)
CHLORIDE: 110 mmol/L (ref 101–111)
CO2: 26 mmol/L (ref 22–32)
Calcium: 8.1 mg/dL — ABNORMAL LOW (ref 8.9–10.3)
Creatinine, Ser: 0.93 mg/dL (ref 0.44–1.00)
GFR calc Af Amer: >60 mL/min (ref >60)
GFR, EST NON AFRICAN AMERICAN: 53 mL/min — AB (ref >60)
GLUCOSE: 85 mg/dL (ref 65–99)
POTASSIUM: 4.1 mmol/L (ref 3.5–5.1)
Sodium: 140 mmol/L (ref 135–145)

## 2015-04-19 LAB — VITAMIN B12: Vitamin B-12: 508 pg/mL (ref 180–914)

## 2015-04-19 MED ORDER — DOCUSATE SODIUM 100 MG PO CAPS: 100.0000 mg | ORAL_CAPSULE | Freq: Two times a day (BID) | ORAL | Status: DC

## 2015-04-19 MED ORDER — FERROUS SULFATE 325 (65 FE) MG PO TABS
325.0000 mg | ORAL_TABLET | Freq: Two times a day (BID) | ORAL | Status: AC
Start: 2015-04-19 — End: ?

## 2015-04-19 MED ORDER — ASPIRIN EC 325 MG PO TBEC: 325.0000 mg | DELAYED_RELEASE_TABLET | Freq: Every day | ORAL | Status: DC

## 2015-04-19 MED ORDER — SENNA 8.6 MG PO TABS: 1.0000 | ORAL_TABLET | Freq: Two times a day (BID) | ORAL | Status: DC | PRN

## 2015-04-19 MED ORDER — OXYCODONE HCL 5 MG PO TABS: 5.0000 mg | ORAL_TABLET | ORAL | Status: DC | PRN

## 2015-04-19 NOTE — Progress Notes (Signed)
Spoke with [place], [facility] rep at [phone], to notify of non-emergent EMS transport.  Auth notification reference given as [id number].   Service date range good from 04/19/15 - 07/18/15.   Gap exception requested to determine if services can be considered at an in-network level.

## 2015-04-19 NOTE — Progress Notes (Signed)
  Subjective: 2 Days Post-Op Procedure(s) (LRB): OPEN REDUCTION INTERNAL FIXATION (ORIF) ANKLE FRACTURE (Left) Patient reports pain as mild.   Patient is well, and has had no acute complaints or problems Plan is to go Skilled nursing facility after hospital stay.  Pt has chosen a bed offer from [facility] Negative for chest pain and shortness of breath Fever: no Gastrointestinal:negative for nausea and vomiting  Objective: Vital signs in last 24 hours: Temp:  [97.3 F (36.3 C)-98.3 F (36.8 C)] 97.8 F (36.6 C) (12/07 0431) Pulse Rate:  [74-119] 81 (12/07 0431) Resp:  [16-18] 18 (12/07 0431) BP: (128-169)/(45-65) 132/50 mmHg (12/07 0431) SpO2:  [95 %-100 %] 97 % (12/07 0431)  Intake/Output from previous day:  Intake/Output Summary (Last 24 hours) at 04/19/15 0723 Last data filed at 04/19/15 0528  Gross per 24 hour  Intake    360 ml  Output    575 ml  Net   -215 ml    Intake/Output this shift:    Labs:  Recent Labs  04/16/15 1617 04/16/15 1904 04/18/15 0421 04/19/15 0439  HGB 12.9 12.4 10.2* 10.0*    Recent Labs  04/18/15 0421 04/19/15 0439  WBC 7.7 5.1  RBC 3.29* 3.23*  HCT 29.6* 29.2*  PLT 232 225    Recent Labs  04/18/15 0421 04/19/15 0439  NA 137 140  K 4.1 4.1  CL 108 110  CO2 25 26  BUN 15 19  CREATININE 0.92 0.93  GLUCOSE 118* 85  CALCIUM 7.8* 8.1*   No results for input(s): LABPT, INR in the last 72 hours.   EXAM General - Patient is Alert, Appropriate and Oriented Extremity - ABD soft Sensation intact distally Dorsiflexion/Plantar flexion intact Incision: dressing C/D/I No cellulitis present Dressing/Incision - clean, dry, no drainage.  Post-op splint shows no signs of drainage or breakdown. Motor Function - intact, moving foot and toes well on exam.   Abdomen soft, without distention or tympany.  Past Medical History  Diagnosis Date  . Hypertension   . Osteoporosis   . Hyperlipidemia   . History of MI (myocardial  infarction)   . Myocardial infarction ([place]) 1962    Assessment/Plan: 2 Days Post-Op Procedure(s) (LRB): OPEN REDUCTION INTERNAL FIXATION (ORIF) ANKLE FRACTURE (Left) Active Problems:   Closed left ankle fracture  Estimated body mass index is 24.88 kg/(m^2) as calculated from the following:   Height as of this encounter: 4\' 9"  (1.448 m).   Weight as of this encounter: 52.164 kg (115 lb). Advance diet Up with therapy   Pt is doing well this AM.  Pt has chosen a bed offer from [facility]. Pt was able to ambulate 7 feet with a walker yesterday.  PT recommending SNF placement. Labs reviewed, Hg and Hct remain stable. Pt denies any chest pain this AM.  HR elevated to 116 with PT yesterday, on tele today.  Will work with PT today and monitor HR.  DVT Prophylaxis - Lovenox and Foot Pumps Non Weight-Bearing as tolerated to left leg  J. Cameron Proud, PA-C [facility] Orthopaedic Surgery 04/19/2015, 7:23 AM

## 2015-04-19 NOTE — Discharge Instructions (Signed)
Diet: As you were doing prior to hospitalization   Shower: Keep the left post-op splint dry.  Dressing:  You may change your dressing as needed. Change the dressing with sterile gauze dressing.    Activity:  Increase activity slowly as tolerated, but follow the weight bearing instructions below.  No lifting or driving for 6 weeks.  Weight Bearing:   Non-weightbearing to the left lower extremity.  To prevent constipation: you may use a stool softener such as -  Colace (over the counter) 100 mg by mouth twice a day  Drink plenty of fluids (prune juice may be helpful) and high fiber foods Miralax (over the counter) for constipation as needed.    Itching:  If you experience itching with your medications, try taking only a single pain pill, or even half a pain pill at a time.  You may take up to 10 pain pills per day, and you can also use benadryl over the counter for itching or also to help with sleep.   Precautions:  If you experience chest pain or shortness of breath - call 911 immediately for transfer to the hospital emergency department!!  If you develop a fever greater that 101 F, purulent drainage from wound, increased redness or drainage from wound, or calf pain-Call [facility]  DVT Prophylaxis:  Take ASA 325 on a daily basis for 14 days.                                               Follow- Up Appointment:  Please call for an appointment to be seen in 2 weeks at [facility]

## 2015-04-19 NOTE — Progress Notes (Signed)
Physical Therapy Treatment Patient Details Name: Anna Barrett MRN: [id number] DOB: 05/04/1923 Today's Date: 04/19/2015    History of Present Illness Pt is a 81 y.o. female presenting to hospital with L ankle pain s/p fall.  Pt found to have L ankle trimalleolar fx dislocation and s/p ORIF 04/17/15.  PMH includes htn, h/o MI.    PT Comments    Pt able to ambulate 10 feet x2 trials with RW and CGA to min assist x1 to steady.  Pt's HR upon PT arriving was 108 bpm at rest and increased to 132 bpm with activity but upon resting returned to 88 bpm within a few minutes.  2nd trial of gait pt's HR increased from 88 bpm to 120 bpm but within about 30 seconds decreased to 100 bpm.  Dr. Tressia Barrett and nursing notified of pt's HR vitals.  Pt still requires vc's for [place] status and anticipate mobility progress also complicated d/t impaired sensation B feet.   Follow Up Recommendations  SNF     Equipment Recommendations  Rolling walker with 5" wheels    Recommendations for Other Services       Precautions / Restrictions Precautions Precautions: Fall Restrictions Weight Bearing Restrictions: Yes LLE Weight Bearing: Non weight bearing    Mobility  Bed Mobility Overal bed mobility: Needs Assistance Bed Mobility: Supine to Sit     Supine to sit: Supervision;HOB elevated     General bed mobility comments: increased time to perform; use of siderail  Transfers Overall transfer level: Needs assistance Equipment used: Rolling walker (2 wheeled) Transfers: Sit to/from Stand Sit to Stand: Min guard;Min assist         General transfer comment: vc's for [place] status; assist to steady; pt requiring vc's for hand and feet placement  Ambulation/Gait Ambulation/Gait assistance: Min guard;Min assist Ambulation Distance (Feet):  (10 feet x2) Assistive device: Rolling walker (2 wheeled)   Gait velocity: vc's to decrease cadence required for safety   General Gait Details: vc's for  [place]; intermittently unsteady; vc's for safe walker use and gait technique required; limited distance d/t HR increase   Stairs            Wheelchair Mobility    Modified Rankin (Stroke Patients Only)       Balance                                    Cognition Arousal/Alertness: Awake/alert Behavior During Therapy: WFL for tasks assessed/performed Overall Cognitive Status: Within Functional Limits for tasks assessed                      Exercises      General Comments   Nursing cleared pt for participation in physical therapy.  Pt agreeable to PT session.      Pertinent Vitals/Pain Pain Assessment: No/denies pain    Home Living                      Prior Function            PT Goals (current goals can now be found in the care plan section) Acute Rehab PT Goals Patient Stated Goal: to go home PT Goal Formulation: With patient Time For Goal Achievement: 05/02/15 Potential to Achieve Goals: Fair Progress towards PT goals: Progressing toward goals    Frequency  BID    PT Plan Current plan remains appropriate  Co-evaluation             End of Session Equipment Utilized During Treatment: Gait belt Activity Tolerance: Patient tolerated treatment well;No increased pain (limited distance d/t HR increase) Patient left: in chair;with call bell/phone within reach;with chair alarm set;with family/visitor present;with SCD's reapplied (L LE elevated on 2 pillows)     Time: [id number] PT Time Calculation (min) (ACUTE ONLY): 29 min  Charges:  $Gait Training: 8-22 mins $Therapeutic Activity: 8-22 mins                    G CodesLeitha Barrett May 05, 2015, 1:14 PM Anna Barrett, [place]

## 2015-04-19 NOTE — Discharge Summary (Signed)
[place] at [place] NAME: Anna Barrett    MR#:  [id number]  DATE OF BIRTH:  Dec 31, 1922  DATE OF ADMISSION:  04/16/2015 ADMITTING PHYSICIAN: Corky Mull, MD  DATE OF DISCHARGE: 04/19/2015  PRIMARY CARE PHYSICIAN: Kirk Ruths., MD    ADMISSION DIAGNOSIS:  Ankle fracture, left, closed, initial encounter [S82.892A] Trimalleolar fracture of ankle, closed, left, initial encounter [S82.852A] Ankle dislocation, left, initial encounter [S93.05XA]  DISCHARGE DIAGNOSIS:  Active Problems:   Closed left ankle fracture   SECONDARY DIAGNOSIS:   Past Medical History  Diagnosis Date  . Hypertension   . Osteoporosis   . Hyperlipidemia   . History of MI (myocardial infarction)   . Myocardial infarction [facility]) [place] COURSE:   81 year old female with past medical history of hypertension, hyperlipidemia, history of previous MI, osteoporosis who presents to the hospital after a fall and noted to have a left ankle fracture.  #1 Left ankle fracture- secondary to the mechanical fall. Also has been having some bilateral feet numbness lately. B12 levels are pending -Appreciate orthopedics input. POD #2 today. -No chest pain. Echo normal. The slight elevation and third troponin is likely demand ischemia. Patient is doing well. -Slightly tachycardic with physical therapy yesterday. Recheck today. On telemetry monitor her heart rate has been regular and within normal limits. -Weightbearing of left leg as per orthopedics. Pain Management and physical therapy -For rehabilitation today  #2 postoperative acute anemia-likely blood loss during surgery and also dilutional. -Monitoring. No acute indication for any transfusion. -Hemoglobin is stable at 10. Discharged on iron supplements  #3 hypertension-continue Cardizem. - monitor, adjust cardizem dose if needed - tele monitoring  #3 hyperlipidemia-continue lovastatin.  #4  osteoporosis-continue vitamin D supplements.  #5 h/o chf- stable, on torsemide low dose Not in acute chf now - ECHO with normal EF  #6 DVT prophylaxis- aspirin 325 mg for 2 weeks per orthopedic recommendations   Discharged to [facility] rehabilitation today  DISCHARGE CONDITIONS:   Stable  CONSULTS OBTAINED:  Treatment Team:  Gladstone Lighter, MD  DRUG ALLERGIES:   Allergies  Allergen Reactions  . Iodine Other (See Comments)    DISCHARGE MEDICATIONS:   Current Discharge Medication List    START taking these medications   Details  aspirin EC 325 MG tablet Take 1 tablet (325 mg total) by mouth daily. Qty: 30 tablet, Refills: 0    docusate sodium (COLACE) 100 MG capsule Take 1 capsule (100 mg total) by mouth 2 (two) times daily. Qty: 10 capsule, Refills: 0    ferrous sulfate 325 (65 FE) MG tablet Take 1 tablet (325 mg total) by mouth 2 (two) times daily with a meal. Qty: 60 tablet, Refills: 2    oxyCODONE (OXY IR/ROXICODONE) 5 MG immediate release tablet Take 1-2 tablets (5-10 mg total) by mouth every 4 (four) hours as needed for breakthrough pain. Qty: 60 tablet, Refills: 0    senna (SENOKOT) 8.6 MG TABS tablet Take 1 tablet (8.6 mg total) by mouth 2 (two) times daily as needed for mild constipation. Qty: 120 each, Refills: 0      CONTINUE these medications which have NOT CHANGED   Details  albuterol (PROVENTIL HFA;VENTOLIN HFA) 108 (90 BASE) MCG/ACT inhaler Inhale 1-2 puffs into the lungs every 6 (six) hours as needed for wheezing or shortness of breath.    CARTIA XT 120 MG 24 hr capsule Take 1 capsule by mouth daily. Refills: 11    Cholecalciferol (D  2000) 2000 UNITS TABS Take 2,000 Units by mouth daily.    lovastatin (MEVACOR) 20 MG tablet Take 20 mg by mouth at bedtime.    torsemide (DEMADEX) 5 MG tablet Take 1 tablet by mouth daily. Refills: 1      STOP taking these medications     aspirin 81 MG chewable tablet          DISCHARGE INSTRUCTIONS:    1. PCP follow-up in 1-2 weeks 2. Orthopedics follow-up in 2 weeks  If you experience worsening of your admission symptoms, develop shortness of breath, life threatening emergency, suicidal or homicidal thoughts you must seek medical attention immediately by calling 911 or calling your MD immediately  if symptoms less severe.  You Must read complete instructions/literature along with all the possible adverse reactions/side effects for all the Medicines you take and that have been prescribed to you. Take any new Medicines after you have completely understood and accept all the possible adverse reactions/side effects.   Please note  You were cared for by a hospitalist during your hospital stay. If you have any questions about your discharge medications or the care you received while you were in the hospital after you are discharged, you can call the unit and asked to speak with the hospitalist on call if the hospitalist that took care of you is not available. Once you are discharged, your primary care physician will handle any further medical issues. Please note that NO REFILLS for any discharge medications will be authorized once you are discharged, as it is imperative that you return to your primary care physician (or establish a relationship with a primary care physician if you do not have one) for your aftercare needs so that they can reassess your need for medications and monitor your lab values.    Today   CHIEF COMPLAINT:   Chief Complaint  Patient presents with  . Ankle Pain  . Fall    VITAL SIGNS:  Blood pressure 140/51, pulse 77, temperature 98 F (36.7 C), temperature source Oral, resp. rate 17, height 4\' 9"  (1.448 m), weight 52.164 kg (115 lb), SpO2 100 %.  I/O:   Intake/Output Summary (Last 24 hours) at 04/19/15 0926 Last data filed at 04/19/15 [id number]  Gross per 24 hour  Intake    360 ml  Output    575 ml  Net   -215 ml    PHYSICAL EXAMINATION:   Physical  Exam  GENERAL: 81 y.o.-year-old patient lying in the bed with no acute distress.  EYES: Pupils equal, round, reactive to light and accommodation. No scleral icterus. Extraocular muscles intact.  HEENT: Head atraumatic, normocephalic. Oropharynx and nasopharynx clear.  NECK: Supple, no jugular venous distention. No thyroid enlargement, no tenderness.  LUNGS: Normal breath sounds bilaterally, no wheezing, rales,rhonchi or crepitation. No use of accessory muscles of respiration.  CARDIOVASCULAR: S1, S2 normal. No murmurs, rubs, or gallops.  ABDOMEN: Soft, nontender, nondistended. Bowel sounds present. No organomegaly or mass.  EXTREMITIES: Left ankle immobilized in a soft cast. No pedal edema, cyanosis, or clubbing. Good dorsalis pedis pulses palpable on the right leg NEUROLOGIC: Cranial nerves II through XII are intact. Muscle strength 5/5 in all extremities except left leg as mobility limited due to fracture and pain. Sensation intact. Gait not checked.  PSYCHIATRIC: The patient is alert and oriented x 3.  SKIN: No obvious rash, lesion, or ulcer.   DATA REVIEW:   CBC  Recent Labs Lab 04/19/15 0439  WBC 5.1  HGB 10.0*  HCT 29.2*  PLT 225    Chemistries   Recent Labs Lab 04/19/15 0439  NA 140  K 4.1  CL 110  CO2 26  GLUCOSE 85  BUN 19  CREATININE 0.93  CALCIUM 8.1*    Cardiac Enzymes  Recent Labs Lab 04/18/15 0421  TROPONINI 0.06*    Microbiology Results  Results for orders placed or performed during the hospital encounter of 04/16/15  MRSA PCR Screening     Status: None   Collection Time: 04/17/15  4:35 AM  Result Value Ref Range Status   MRSA by PCR NEGATIVE NEGATIVE Final    Comment:        The GeneXpert MRSA Assay (FDA approved for NASAL specimens only), is one component of a comprehensive MRSA colonization surveillance program. It is not intended to diagnose MRSA infection nor to guide or monitor treatment for MRSA infections.      RADIOLOGY:  Dg Ankle 2 Views Left  04/17/2015  CLINICAL DATA:  Postop fracture reduction and fixation EXAM: DG C-ARM 61-120 MIN; LEFT ANKLE - 2 VIEW COMPARISON:  04/16/2015 FINDINGS: The patient is status post open reduction internal fixation of left ankle fracture. Metallic fixation plate and screws are noted in distal fibula. Two metallic fixation screws are noted in distal tibia medial malleolus. Ankle mortise is restored. A third fixation screw is noted in distal tibia. IMPRESSION: Status post open reduction internal fixation of left ankle fracture. Metallic fixation plate and screws in distal fibula. Metallic fixation screws in distal tibia. There is anatomic alignment. Fluoroscopy time 43 seconds.  Please see the operative report Electronically Signed   By: Lahoma Crocker M.D.   On: 04/17/2015 15:10   Dg Chest Port 1 View  04/17/2015  CLINICAL DATA:  Dyspnea, recent ankle surgery EXAM: PORTABLE CHEST - 1 VIEW COMPARISON:  04/16/2015 FINDINGS: Cardiac shadow is enlarged. The thoracic aorta is again tortuous in nature. Lungs are well aerated bilaterally. Mild increased density is noted in the left lung base consistent with atelectasis and small effusion. No other focal infiltrate is noted. IMPRESSION: Left basilar atelectasis and small effusion. Continued followup is recommended. Electronically Signed   By: Inez Catalina M.D.   On: 04/17/2015 18:40   Dg C-arm 61-120 Min  04/17/2015  CLINICAL DATA:  Postop fracture reduction and fixation EXAM: DG C-ARM 61-120 MIN; LEFT ANKLE - 2 VIEW COMPARISON:  04/16/2015 FINDINGS: The patient is status post open reduction internal fixation of left ankle fracture. Metallic fixation plate and screws are noted in distal fibula. Two metallic fixation screws are noted in distal tibia medial malleolus. Ankle mortise is restored. A third fixation screw is noted in distal tibia. IMPRESSION: Status post open reduction internal fixation of left ankle fracture. Metallic fixation  plate and screws in distal fibula. Metallic fixation screws in distal tibia. There is anatomic alignment. Fluoroscopy time 43 seconds.  Please see the operative report Electronically Signed   By: Lahoma Crocker M.D.   On: 04/17/2015 15:10    EKG:   Orders placed or performed during the hospital encounter of 04/16/15  . EKG 12-Lead  . EKG 12-Lead      Management plans discussed with the patient, family and they are in agreement.  CODE STATUS:     Code Status Orders        Start     Ordered   04/17/15 1750  Full code   Continuous     04/17/15 1749    Advance Directive Documentation  Most Recent Value   Type of Advance Directive  Living will   Pre-existing out of facility DNR order (yellow form or pink MOST form)     "MOST" Form in Place?        TOTAL TIME TAKING CARE OF THIS PATIENT: 37 minutes.    Gladstone Lighter M.D on 04/19/2015 at 9:26 AM  Between 7am to 6pm - Pager - [phone]  After 6pm go to [email or web address] - password [email or web address] [place] [facility]  Office  [phone]  CC: Primary care physician; Kirk Ruths., MD

## 2015-04-19 NOTE — Clinical Social Work Placement (Signed)
   CLINICAL SOCIAL WORK PLACEMENT  NOTE  Date:  04/19/2015  Patient Details  Name: Anna Barrett MRN: [id number] Date of Birth: 30-Oct-1922  Clinical Social Work is seeking post-discharge placement for this patient at the [place] level of care (*CSW will initial, date and re-position this form in  chart as items are completed):  Yes   Patient/family provided with [place] [facility]'s list of facilities offering this level of care within the geographic area requested by the patient (or if unable, by the patient's family).  Yes   Patient/family informed of their freedom to choose among providers that offer the needed level of care, that participate in Medicare, Medicaid or managed care program needed by the patient, have an available bed and are willing to accept the patient.  Yes   Patient/family informed of 's ownership interest in [facility] and [facility], as well as of the fact that they are under no obligation to receive care at these facilities.  PASRR submitted to EDS on 04/17/15     PASRR number received on 04/17/15     Existing PASRR number confirmed on       FL2 transmitted to all facilities in geographic area requested by pt/family on 04/17/15     FL2 transmitted to all facilities within larger geographic area on       Patient informed that his/her managed care company has contracts with or will negotiate with certain facilities, including the following:        Yes   Patient/family informed of bed offers received.  Patient chooses bed at  [facility] )     Physician recommends and patient chooses bed at      Patient to be transferred to  [facility] ) on 04/19/15.  Patient to be transferred to facility by  [facility] )     Patient family notified on 04/19/15 of transfer.  Name of family member notified:   (Patient's daughter [name] is aware of D/C today.  )     PHYSICIAN        Additional Comment:    _______________________________________________ Loralyn Freshwater, LCSW 04/19/2015, 10:53 AM

## 2015-04-19 NOTE — NC FL2 (Signed)
[place] LEVEL OF CARE SCREENING TOOL     IDENTIFICATION  Patient Name: Anna Barrett Birthdate: December 08, 1922 Sex: female Admission Date (Current Location): 04/16/2015  [place] and [place] Number:  [facility] )   Facility and Address:  [facility], [address], [place], [place] [address]      Provider Number: [id number]  Attending Physician Name and Address:  Gladstone Lighter, MD  Relative Name and Phone Number:       Current Level of Care: Hospital Recommended Level of Care: [place] Prior Approval Number:    Date Approved/Denied:   PASRR Number:  ([id number])  Discharge Plan: SNF    Current Diagnoses: Patient Active Problem List   Diagnosis Date Noted  . Closed left ankle fracture 04/16/2015    Orientation ACTIVITIES/SOCIAL BLADDER RESPIRATION    Self, Time, Situation, Place  Active Continent Normal  BEHAVIORAL SYMPTOMS/MOOD NEUROLOGICAL BOWEL NUTRITION STATUS   (none )  (none ) Continent Diet (NPO for surgery )  PHYSICIAN VISITS COMMUNICATION OF NEEDS Height & Weight Skin  30 days Verbally 4\' 9"  (144.8 cm) 115 lbs. Surgical wounds (Incision: Left Leg)          AMBULATORY STATUS RESPIRATION    Assist extensive Normal      Personal Care Assistance Level of Assistance  Bathing, Feeding, Dressing Bathing Assistance: Limited assistance Feeding assistance: Independent Dressing Assistance: Limited assistance      Functional Limitations Info  Sight, Hearing, Speech Sight Info: Adequate Hearing Info: Adequate Speech Info: Adequate       SPECIAL CARE FACTORS FREQUENCY  PT (By licensed PT), OT (By licensed OT)     PT Frequency:  (5) OT Frequency:  (5)           Additional Factors Info  Code Status, Allergies Code Status Info:  (Full Code. ) Allergies Info:  (Iodine)           Current Medications (04/19/2015):  This is the current hospital active medication list Current  Facility-Administered Medications  Medication Dose Route Frequency Provider Last Rate Last Dose  . acetaminophen (TYLENOL) tablet 650 mg  650 mg Oral Q6H PRN Corky Mull, MD       Or  . acetaminophen (TYLENOL) suppository 650 mg  650 mg Rectal Q6H PRN Corky Mull, MD      . albuterol (PROVENTIL) (2.5 MG/3ML) 0.083% nebulizer solution 2.5 mg  2.5 mg Inhalation Q6H PRN Corky Mull, MD      . aspirin chewable tablet 81 mg  81 mg Oral Daily Corky Mull, MD   81 mg at 04/19/15 [id number]  . bisacodyl (DULCOLAX) suppository 10 mg  10 mg Rectal Daily PRN Corky Mull, MD   10 mg at 04/19/15 [id number]  . cholecalciferol (VITAMIN D) tablet 2,000 Units  2,000 Units Oral Daily Corky Mull, MD   2,000 Units at 04/19/15 [phone]  . dextrose 5 % and 0.9 % NaCl with KCl 20 mEq/L infusion   Intravenous Continuous Corky Mull, MD      . diltiazem (CARDIZEM CD) 24 hr capsule 120 mg  120 mg Oral Daily Henreitta Leber, MD   120 mg at 04/19/15 [id number]  . diphenhydrAMINE (BENADRYL) 12.5 MG/5ML elixir 12.5-25 mg  12.5-25 mg Oral Q4H PRN Corky Mull, MD      . docusate sodium (COLACE) capsule 100 mg  100 mg Oral BID Corky Mull, MD   100 mg at 04/19/15 [id number]  .  enoxaparin (LOVENOX) injection 30 mg  30 mg Subcutaneous Q24H Corky Mull, MD   30 mg at 04/19/15 [id number]  . hydrALAZINE (APRESOLINE) injection 10 mg  10 mg Intravenous Q6H PRN Gladstone Lighter, MD      . HYDROmorphone (DILAUDID) injection 0.5 mg  0.5 mg Intravenous Q2H PRN Corky Mull, MD      . magnesium hydroxide (MILK OF MAGNESIA) suspension 30 mL  30 mL Oral Daily PRN Corky Mull, MD   30 mL at 04/19/15 [id number]  . metoCLOPramide (REGLAN) tablet 5-10 mg  5-10 mg Oral Q8H PRN Corky Mull, MD       Or  . metoCLOPramide (REGLAN) injection 5-10 mg  5-10 mg Intravenous Q8H PRN Corky Mull, MD      . ondansetron [facility]) tablet 4 mg  4 mg Oral Q6H PRN Corky Mull, MD       Or  . ondansetron [facility]) injection 4 mg  4 mg Intravenous Q6H PRN Corky Mull, MD      .  oxyCODONE (Oxy IR/ROXICODONE) immediate release tablet 5-10 mg  5-10 mg Oral Q3H PRN Corky Mull, MD   10 mg at 04/19/15 0555  . pravastatin (PRAVACHOL) tablet 20 mg  20 mg Oral q1800 Corky Mull, MD   20 mg at 04/18/15 1624  . senna (SENOKOT) tablet 8.6 mg  1 tablet Oral BID PRN Gladstone Lighter, MD      . sodium phosphate (FLEET) 7-19 GM/118ML enema 1 enema  1 enema Rectal Once PRN Corky Mull, MD      . torsemide [facility]) tablet 5 mg  5 mg Oral Daily Henreitta Leber, MD   5 mg at 04/19/15 [id number]     Discharge Medications: Please see discharge summary for a list of discharge medications.  Relevant Imaging Results:  Relevant Lab Results:  Recent Labs    Additional Information  (SSN: [id number])  Loralyn Freshwater, LCSW

## 2015-04-19 NOTE — Progress Notes (Signed)
Patient is medically stable for D/C to [facility] today. Per Kim admissions coordinator at [facility] patient is going to room 214-A. RN will call report at [phone] and arrange EMS after lunch. Clinical Education officer, museum (CSW) sent D/C Summary, D/C packet and FL2 to [employer] via [employer]. Patient is aware of above. Patient's daughter [name] is at bedside and aware of above. Please reconsult if future social work needs arise. CSW signing off.   Blima Rich, [place] [phone]

## 2015-05-02 DIAGNOSIS — D649 Anemia, unspecified: Secondary | ICD-10-CM | POA: Diagnosis present

## 2015-05-02 LAB — CBC WITH DIFFERENTIAL/PLATELET
BASOS ABS: 0 10*3/uL (ref 0–0.1)
Basophils Relative: 1 %
EOS ABS: 0.2 10*3/uL (ref 0–0.7)
EOS PCT: 4 %
HCT: 36.7 % (ref 35.0–47.0)
HEMOGLOBIN: 11.7 g/dL — AB (ref 12.0–16.0)
LYMPHS ABS: 1.3 10*3/uL (ref 1.0–3.6)
Lymphocytes Relative: 26 %
MCH: 28.8 pg (ref 26.0–34.0)
MCHC: 31.9 g/dL — ABNORMAL LOW (ref 32.0–36.0)
MCV: 90.3 fL (ref 80.0–100.0)
Monocytes Absolute: 0.5 10*3/uL (ref 0.2–0.9)
Monocytes Relative: 9 %
NEUTROS PCT: 60 %
Neutro Abs: 3.1 10*3/uL (ref 1.4–6.5)
PLATELETS: 381 10*3/uL (ref 150–440)
RBC: 4.06 MIL/uL (ref 3.80–5.20)
RDW: 13.1 % (ref 11.5–14.5)
WBC: 5.2 10*3/uL (ref 3.6–11.0)

## 2015-05-14 DIAGNOSIS — I110 Hypertensive heart disease with heart failure: Secondary | ICD-10-CM | POA: Diagnosis not present

## 2015-05-14 DIAGNOSIS — D62 Acute posthemorrhagic anemia: Secondary | ICD-10-CM | POA: Diagnosis not present

## 2015-05-14 DIAGNOSIS — M80072D Age-related osteoporosis with current pathological fracture, left ankle and foot, subsequent encounter for fracture with routine healing: Secondary | ICD-10-CM | POA: Diagnosis not present

## 2015-05-14 DIAGNOSIS — I509 Heart failure, unspecified: Secondary | ICD-10-CM | POA: Diagnosis not present

## 2015-05-14 DIAGNOSIS — M6281 Muscle weakness (generalized): Secondary | ICD-10-CM | POA: Diagnosis not present

## 2015-05-14 DIAGNOSIS — R2689 Other abnormalities of gait and mobility: Secondary | ICD-10-CM | POA: Diagnosis not present

## 2015-05-14 DIAGNOSIS — E785 Hyperlipidemia, unspecified: Secondary | ICD-10-CM | POA: Diagnosis not present

## 2015-05-14 DIAGNOSIS — I252 Old myocardial infarction: Secondary | ICD-10-CM | POA: Diagnosis not present

## 2015-05-14 DIAGNOSIS — Z9181 History of falling: Secondary | ICD-10-CM | POA: Diagnosis not present

## 2015-05-15 ENCOUNTER — Encounter
Admission: RE | Admit: 2015-05-15 | Discharge: 2015-05-15 | Disposition: A | Discharge status: Home / Self Care | Payer: Medicare Other | Source: Ambulatory Visit | Attending: Internal Medicine | Admitting: Internal Medicine

## 2015-05-17 DIAGNOSIS — S82852D Displaced trimalleolar fracture of left lower leg, subsequent encounter for closed fracture with routine healing: Secondary | ICD-10-CM | POA: Diagnosis not present

## 2015-05-27 DIAGNOSIS — I509 Heart failure, unspecified: Secondary | ICD-10-CM | POA: Diagnosis not present

## 2015-05-27 DIAGNOSIS — I10 Essential (primary) hypertension: Secondary | ICD-10-CM | POA: Diagnosis not present

## 2015-05-27 DIAGNOSIS — J45909 Unspecified asthma, uncomplicated: Secondary | ICD-10-CM | POA: Diagnosis not present

## 2015-05-27 DIAGNOSIS — S82852D Displaced trimalleolar fracture of left lower leg, subsequent encounter for closed fracture with routine healing: Secondary | ICD-10-CM | POA: Diagnosis not present

## 2015-05-27 DIAGNOSIS — I252 Old myocardial infarction: Secondary | ICD-10-CM | POA: Diagnosis not present

## 2015-06-09 DIAGNOSIS — I10 Essential (primary) hypertension: Secondary | ICD-10-CM | POA: Diagnosis not present

## 2015-06-09 DIAGNOSIS — J45909 Unspecified asthma, uncomplicated: Secondary | ICD-10-CM | POA: Diagnosis not present

## 2015-06-09 DIAGNOSIS — I509 Heart failure, unspecified: Secondary | ICD-10-CM | POA: Diagnosis not present

## 2015-06-09 DIAGNOSIS — I252 Old myocardial infarction: Secondary | ICD-10-CM | POA: Diagnosis not present

## 2015-06-09 DIAGNOSIS — S82852D Displaced trimalleolar fracture of left lower leg, subsequent encounter for closed fracture with routine healing: Secondary | ICD-10-CM | POA: Diagnosis not present

## 2015-06-16 DIAGNOSIS — I252 Old myocardial infarction: Secondary | ICD-10-CM | POA: Diagnosis not present

## 2015-06-16 DIAGNOSIS — J45909 Unspecified asthma, uncomplicated: Secondary | ICD-10-CM | POA: Diagnosis not present

## 2015-06-16 DIAGNOSIS — I509 Heart failure, unspecified: Secondary | ICD-10-CM | POA: Diagnosis not present

## 2015-06-16 DIAGNOSIS — I10 Essential (primary) hypertension: Secondary | ICD-10-CM | POA: Diagnosis not present

## 2015-06-16 DIAGNOSIS — S82852D Displaced trimalleolar fracture of left lower leg, subsequent encounter for closed fracture with routine healing: Secondary | ICD-10-CM | POA: Diagnosis not present

## 2015-06-20 DIAGNOSIS — I252 Old myocardial infarction: Secondary | ICD-10-CM | POA: Diagnosis not present

## 2015-06-20 DIAGNOSIS — S82852D Displaced trimalleolar fracture of left lower leg, subsequent encounter for closed fracture with routine healing: Secondary | ICD-10-CM | POA: Diagnosis not present

## 2015-06-20 DIAGNOSIS — I10 Essential (primary) hypertension: Secondary | ICD-10-CM | POA: Diagnosis not present

## 2015-06-20 DIAGNOSIS — J45909 Unspecified asthma, uncomplicated: Secondary | ICD-10-CM | POA: Diagnosis not present

## 2015-06-20 DIAGNOSIS — I509 Heart failure, unspecified: Secondary | ICD-10-CM | POA: Diagnosis not present

## 2015-06-23 DIAGNOSIS — S82852D Displaced trimalleolar fracture of left lower leg, subsequent encounter for closed fracture with routine healing: Secondary | ICD-10-CM | POA: Diagnosis not present

## 2015-06-23 DIAGNOSIS — J45909 Unspecified asthma, uncomplicated: Secondary | ICD-10-CM | POA: Diagnosis not present

## 2015-06-23 DIAGNOSIS — I252 Old myocardial infarction: Secondary | ICD-10-CM | POA: Diagnosis not present

## 2015-06-23 DIAGNOSIS — I509 Heart failure, unspecified: Secondary | ICD-10-CM | POA: Diagnosis not present

## 2015-06-23 DIAGNOSIS — I10 Essential (primary) hypertension: Secondary | ICD-10-CM | POA: Diagnosis not present

## 2015-06-26 DIAGNOSIS — I252 Old myocardial infarction: Secondary | ICD-10-CM | POA: Diagnosis not present

## 2015-06-26 DIAGNOSIS — J45909 Unspecified asthma, uncomplicated: Secondary | ICD-10-CM | POA: Diagnosis not present

## 2015-06-26 DIAGNOSIS — I10 Essential (primary) hypertension: Secondary | ICD-10-CM | POA: Diagnosis not present

## 2015-06-26 DIAGNOSIS — I509 Heart failure, unspecified: Secondary | ICD-10-CM | POA: Diagnosis not present

## 2015-06-26 DIAGNOSIS — S82852D Displaced trimalleolar fracture of left lower leg, subsequent encounter for closed fracture with routine healing: Secondary | ICD-10-CM | POA: Diagnosis not present

## 2015-07-03 DIAGNOSIS — I509 Heart failure, unspecified: Secondary | ICD-10-CM | POA: Diagnosis not present

## 2015-07-03 DIAGNOSIS — I10 Essential (primary) hypertension: Secondary | ICD-10-CM | POA: Diagnosis not present

## 2015-07-03 DIAGNOSIS — S82852D Displaced trimalleolar fracture of left lower leg, subsequent encounter for closed fracture with routine healing: Secondary | ICD-10-CM | POA: Diagnosis not present

## 2015-07-03 DIAGNOSIS — I252 Old myocardial infarction: Secondary | ICD-10-CM | POA: Diagnosis not present

## 2015-07-03 DIAGNOSIS — J45909 Unspecified asthma, uncomplicated: Secondary | ICD-10-CM | POA: Diagnosis not present

## 2015-07-11 DIAGNOSIS — I255 Ischemic cardiomyopathy: Secondary | ICD-10-CM | POA: Diagnosis not present

## 2015-07-11 DIAGNOSIS — J4520 Mild intermittent asthma, uncomplicated: Secondary | ICD-10-CM | POA: Diagnosis not present

## 2015-07-11 DIAGNOSIS — R739 Hyperglycemia, unspecified: Secondary | ICD-10-CM | POA: Diagnosis not present

## 2015-07-11 DIAGNOSIS — I480 Paroxysmal atrial fibrillation: Secondary | ICD-10-CM | POA: Diagnosis not present

## 2015-07-11 DIAGNOSIS — E782 Mixed hyperlipidemia: Secondary | ICD-10-CM | POA: Diagnosis not present

## 2015-07-11 DIAGNOSIS — I10 Essential (primary) hypertension: Secondary | ICD-10-CM | POA: Diagnosis not present

## 2015-07-11 DIAGNOSIS — D692 Other nonthrombocytopenic purpura: Secondary | ICD-10-CM | POA: Diagnosis not present

## 2015-07-12 DIAGNOSIS — J45909 Unspecified asthma, uncomplicated: Secondary | ICD-10-CM | POA: Diagnosis not present

## 2015-07-12 DIAGNOSIS — S82852D Displaced trimalleolar fracture of left lower leg, subsequent encounter for closed fracture with routine healing: Secondary | ICD-10-CM | POA: Diagnosis not present

## 2015-07-12 DIAGNOSIS — I509 Heart failure, unspecified: Secondary | ICD-10-CM | POA: Diagnosis not present

## 2015-07-12 DIAGNOSIS — I10 Essential (primary) hypertension: Secondary | ICD-10-CM | POA: Diagnosis not present

## 2015-07-12 DIAGNOSIS — I252 Old myocardial infarction: Secondary | ICD-10-CM | POA: Diagnosis not present

## 2015-08-04 DIAGNOSIS — S82852D Displaced trimalleolar fracture of left lower leg, subsequent encounter for closed fracture with routine healing: Secondary | ICD-10-CM | POA: Diagnosis not present

## 2015-08-04 DIAGNOSIS — S82892A Other fracture of left lower leg, initial encounter for closed fracture: Secondary | ICD-10-CM | POA: Diagnosis not present

## 2015-09-04 DIAGNOSIS — R0789 Other chest pain: Secondary | ICD-10-CM | POA: Diagnosis not present

## 2015-09-04 DIAGNOSIS — R0781 Pleurodynia: Secondary | ICD-10-CM | POA: Diagnosis not present

## 2015-09-04 DIAGNOSIS — R079 Chest pain, unspecified: Secondary | ICD-10-CM | POA: Diagnosis not present

## 2015-09-04 DIAGNOSIS — M546 Pain in thoracic spine: Secondary | ICD-10-CM | POA: Diagnosis not present

## 2015-09-15 DIAGNOSIS — S82852D Displaced trimalleolar fracture of left lower leg, subsequent encounter for closed fracture with routine healing: Secondary | ICD-10-CM | POA: Diagnosis not present

## 2015-11-24 DIAGNOSIS — Z961 Presence of intraocular lens: Secondary | ICD-10-CM | POA: Diagnosis not present

## 2015-12-01 DIAGNOSIS — H01021 Squamous blepharitis right upper eyelid: Secondary | ICD-10-CM | POA: Diagnosis not present

## 2015-12-28 DIAGNOSIS — X32XXXA Exposure to sunlight, initial encounter: Secondary | ICD-10-CM | POA: Diagnosis not present

## 2015-12-28 DIAGNOSIS — L814 Other melanin hyperpigmentation: Secondary | ICD-10-CM | POA: Diagnosis not present

## 2015-12-28 DIAGNOSIS — C44622 Squamous cell carcinoma of skin of right upper limb, including shoulder: Secondary | ICD-10-CM | POA: Diagnosis not present

## 2015-12-28 DIAGNOSIS — L538 Other specified erythematous conditions: Secondary | ICD-10-CM | POA: Diagnosis not present

## 2015-12-28 DIAGNOSIS — L820 Inflamed seborrheic keratosis: Secondary | ICD-10-CM | POA: Diagnosis not present

## 2015-12-28 DIAGNOSIS — L298 Other pruritus: Secondary | ICD-10-CM | POA: Diagnosis not present

## 2015-12-28 DIAGNOSIS — D485 Neoplasm of uncertain behavior of skin: Secondary | ICD-10-CM | POA: Diagnosis not present

## 2015-12-28 DIAGNOSIS — L821 Other seborrheic keratosis: Secondary | ICD-10-CM | POA: Diagnosis not present

## 2016-01-01 DIAGNOSIS — I10 Essential (primary) hypertension: Secondary | ICD-10-CM | POA: Diagnosis not present

## 2016-01-01 DIAGNOSIS — I255 Ischemic cardiomyopathy: Secondary | ICD-10-CM | POA: Diagnosis not present

## 2016-01-01 DIAGNOSIS — E782 Mixed hyperlipidemia: Secondary | ICD-10-CM | POA: Diagnosis not present

## 2016-01-08 DIAGNOSIS — I10 Essential (primary) hypertension: Secondary | ICD-10-CM | POA: Diagnosis not present

## 2016-01-08 DIAGNOSIS — I255 Ischemic cardiomyopathy: Secondary | ICD-10-CM | POA: Diagnosis not present

## 2016-01-08 DIAGNOSIS — I480 Paroxysmal atrial fibrillation: Secondary | ICD-10-CM | POA: Diagnosis not present

## 2016-01-08 DIAGNOSIS — I779 Disorder of arteries and arterioles, unspecified: Secondary | ICD-10-CM | POA: Diagnosis not present

## 2016-01-08 DIAGNOSIS — E782 Mixed hyperlipidemia: Secondary | ICD-10-CM | POA: Diagnosis not present

## 2016-01-08 DIAGNOSIS — D692 Other nonthrombocytopenic purpura: Secondary | ICD-10-CM | POA: Diagnosis not present

## 2016-01-08 DIAGNOSIS — J4520 Mild intermittent asthma, uncomplicated: Secondary | ICD-10-CM | POA: Diagnosis not present

## 2016-01-08 DIAGNOSIS — R739 Hyperglycemia, unspecified: Secondary | ICD-10-CM | POA: Diagnosis not present

## 2016-04-10 DIAGNOSIS — D0461 Carcinoma in situ of skin of right upper limb, including shoulder: Secondary | ICD-10-CM | POA: Diagnosis not present

## 2016-04-10 DIAGNOSIS — C44622 Squamous cell carcinoma of skin of right upper limb, including shoulder: Secondary | ICD-10-CM | POA: Diagnosis not present

## 2016-04-10 DIAGNOSIS — L905 Scar conditions and fibrosis of skin: Secondary | ICD-10-CM | POA: Diagnosis not present

## 2016-06-11 DIAGNOSIS — R739 Hyperglycemia, unspecified: Secondary | ICD-10-CM | POA: Diagnosis not present

## 2016-06-11 DIAGNOSIS — I10 Essential (primary) hypertension: Secondary | ICD-10-CM | POA: Diagnosis not present

## 2016-06-11 DIAGNOSIS — E782 Mixed hyperlipidemia: Secondary | ICD-10-CM | POA: Diagnosis not present

## 2016-06-18 DIAGNOSIS — Z0000 Encounter for general adult medical examination without abnormal findings: Secondary | ICD-10-CM | POA: Diagnosis not present

## 2016-06-18 DIAGNOSIS — E782 Mixed hyperlipidemia: Secondary | ICD-10-CM | POA: Diagnosis not present

## 2016-06-18 DIAGNOSIS — I255 Ischemic cardiomyopathy: Secondary | ICD-10-CM | POA: Diagnosis not present

## 2016-06-18 DIAGNOSIS — I779 Disorder of arteries and arterioles, unspecified: Secondary | ICD-10-CM | POA: Diagnosis not present

## 2016-06-18 DIAGNOSIS — R739 Hyperglycemia, unspecified: Secondary | ICD-10-CM | POA: Diagnosis not present

## 2016-06-18 DIAGNOSIS — D692 Other nonthrombocytopenic purpura: Secondary | ICD-10-CM | POA: Diagnosis not present

## 2016-06-18 DIAGNOSIS — I480 Paroxysmal atrial fibrillation: Secondary | ICD-10-CM | POA: Diagnosis not present

## 2016-07-11 DIAGNOSIS — D225 Melanocytic nevi of trunk: Secondary | ICD-10-CM | POA: Diagnosis not present

## 2016-07-11 DIAGNOSIS — D2261 Melanocytic nevi of right upper limb, including shoulder: Secondary | ICD-10-CM | POA: Diagnosis not present

## 2016-07-11 DIAGNOSIS — L821 Other seborrheic keratosis: Secondary | ICD-10-CM | POA: Diagnosis not present

## 2016-07-11 DIAGNOSIS — Z85828 Personal history of other malignant neoplasm of skin: Secondary | ICD-10-CM | POA: Diagnosis not present

## 2016-07-24 DIAGNOSIS — R05 Cough: Secondary | ICD-10-CM | POA: Diagnosis not present

## 2016-08-28 IMAGING — US US EXTREM LOW VENOUS*R*
1 series · 13 of 24 positions shown · non-contrast
Comparison: None.

CLINICAL DATA: Right leg swelling.



[Series 1: us extrem low venous*right* · 0.07mm/px · 13 of 31 slices shown]
[im 1/31]
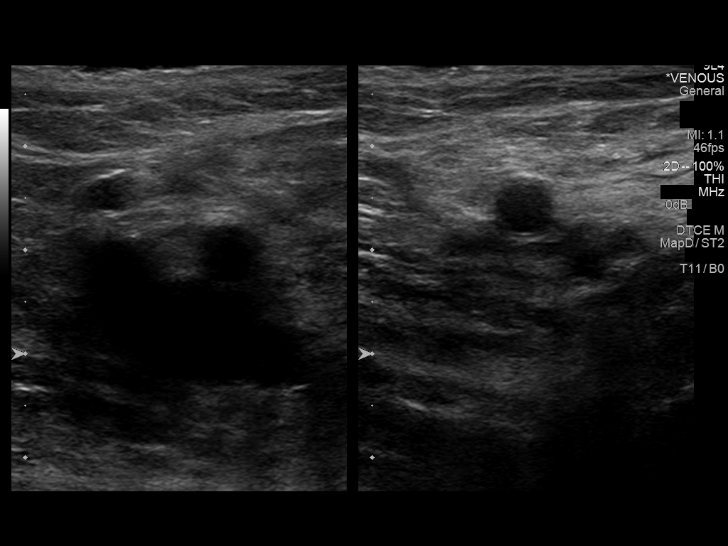
[im 3/31]
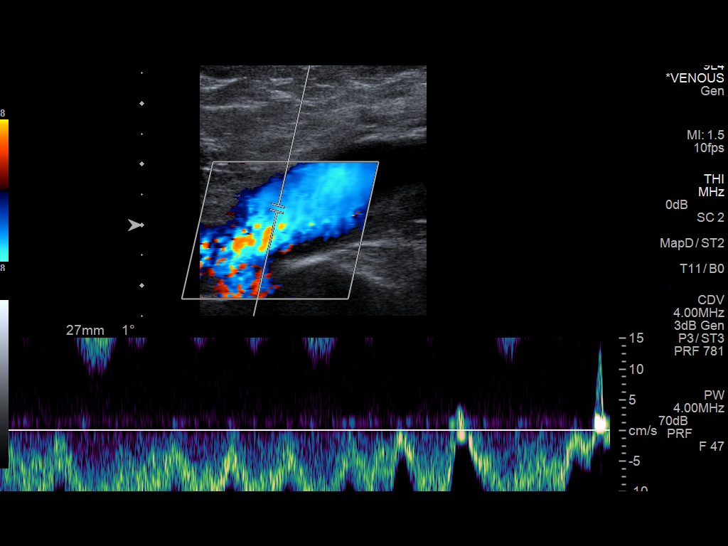
[im 6/31]
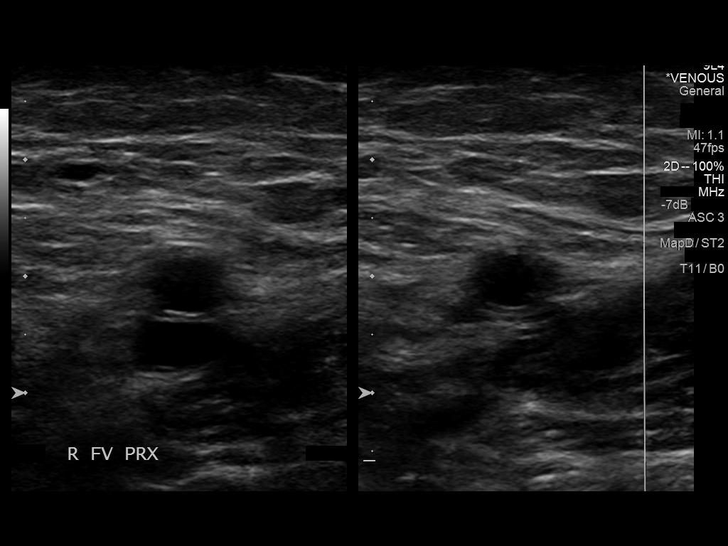
[im 8/31]
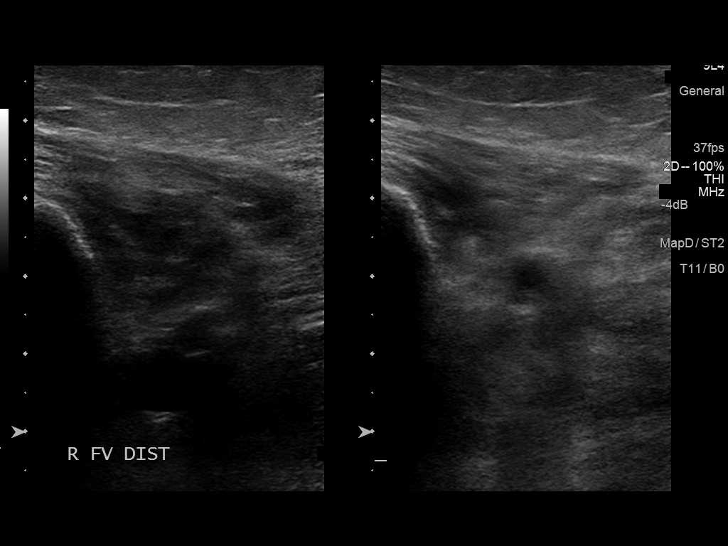
[im 11/31]
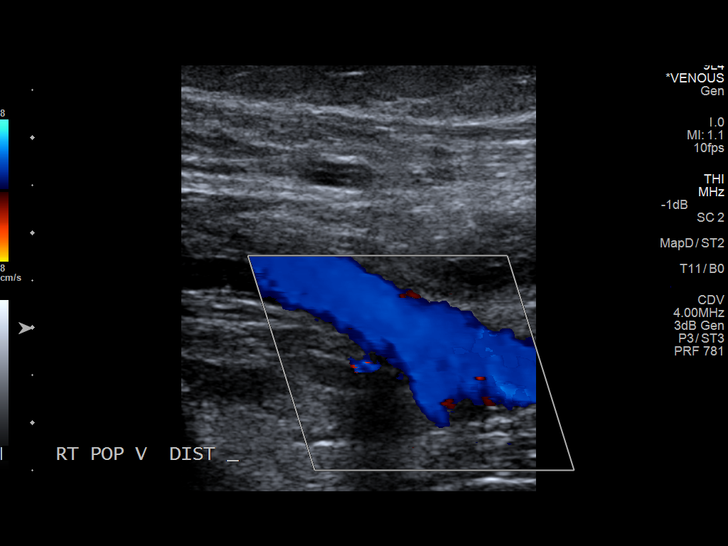
[im 14/31]
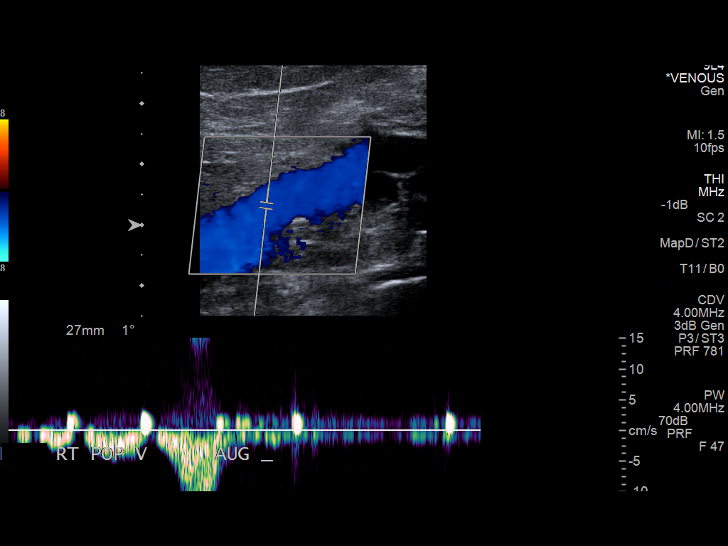
[im 16/31]
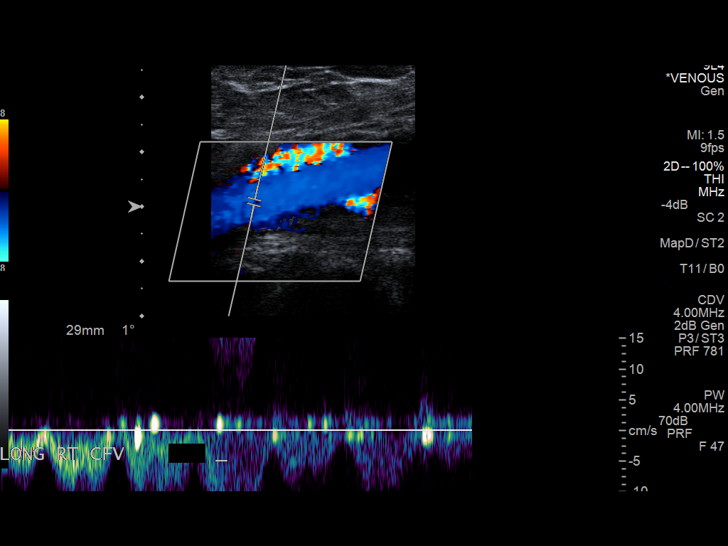
[im 17/31]
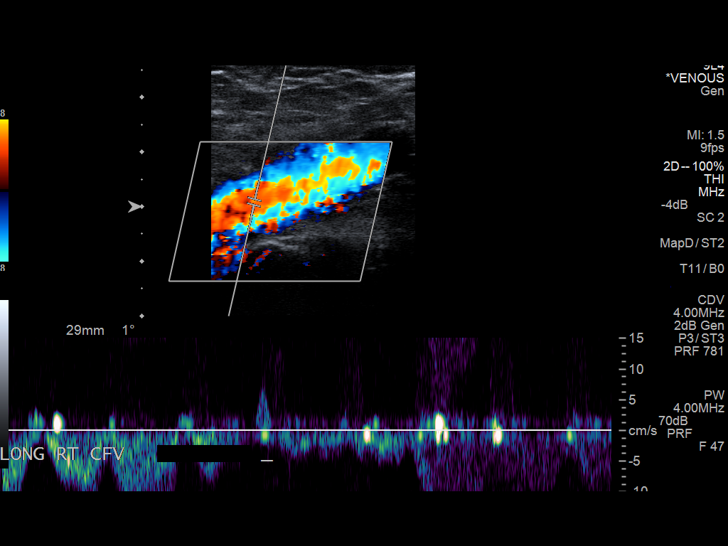
[im 20/31]
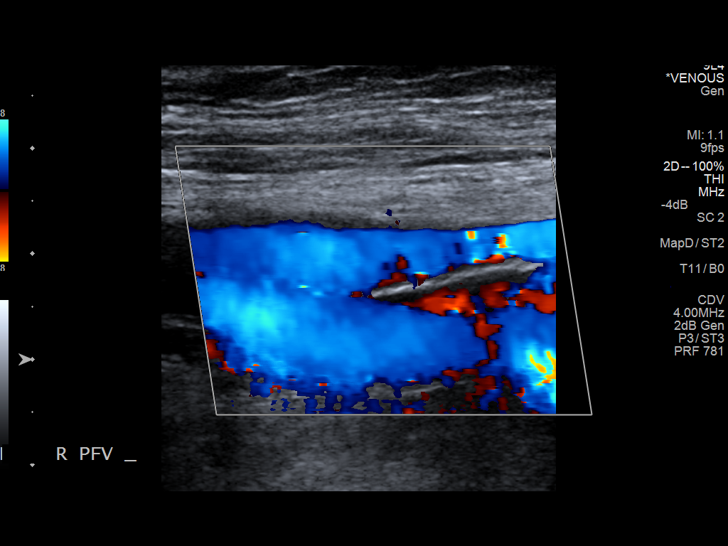
[im 23/31]
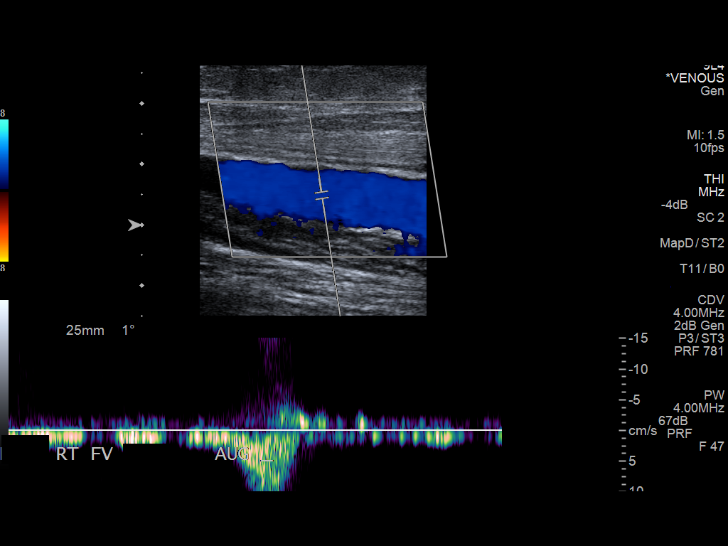
[im 25/31]
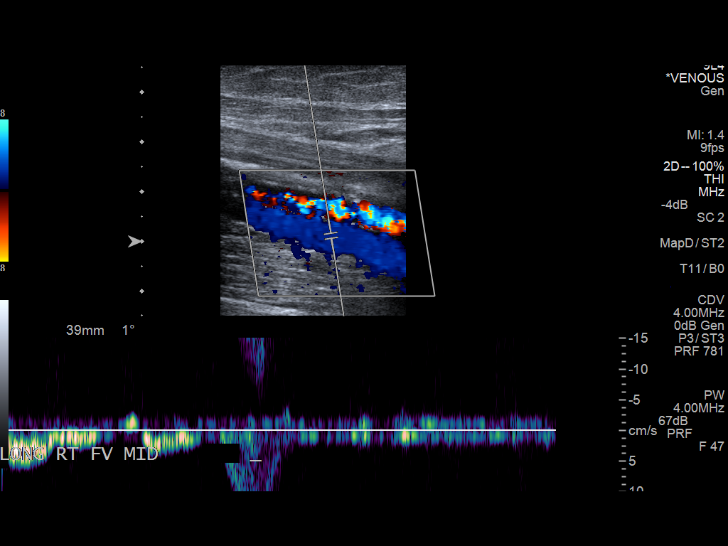
[im 28/31]
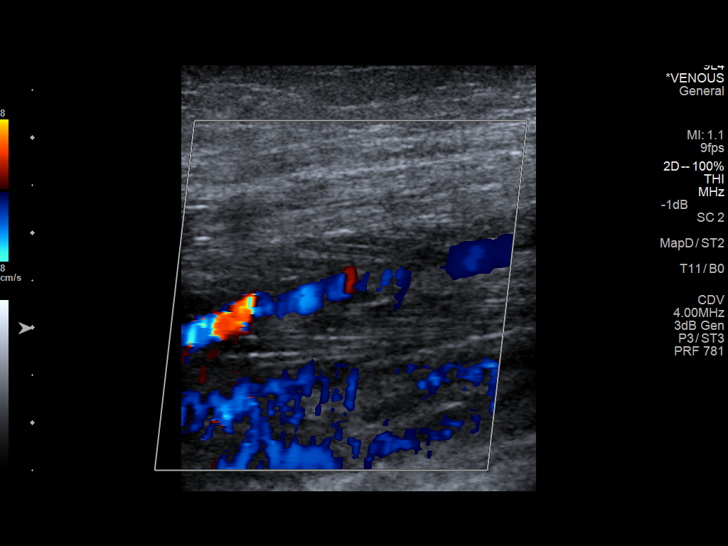
[im 31/31]
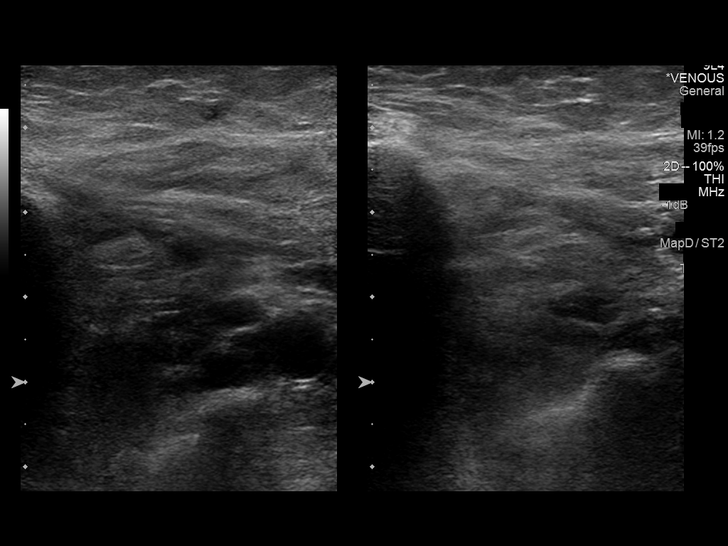

[13 of 24 positions shown; findings below may reference images not displayed]

FINDINGS: Contralateral Common Femoral Vein: Respiratory phasicity is normal
and symmetric with the symptomatic side. No evidence of thrombus.
Normal compressibility.

Common Femoral Vein: No evidence of thrombus. Normal
compressibility, respiratory phasicity and response to augmentation.

Saphenofemoral Junction: No evidence of thrombus. Normal
compressibility and flow on color Doppler imaging.

Profunda Femoral Vein: No evidence of thrombus. Normal
compressibility and flow on color Doppler imaging.

Femoral Vein: No evidence of thrombus. Normal compressibility,
respiratory phasicity and response to augmentation.

Popliteal Vein: No evidence of thrombus. Normal compressibility,
respiratory phasicity and response to augmentation.

Calf Veins: No evidence of thrombus. Normal compressibility and flow
on color Doppler imaging.

Superficial Great Saphenous Vein: No evidence of thrombus. Normal
compressibility and flow on color Doppler imaging.

Venous Reflux:  None.

Other Findings:  None.
IMPRESSION: No evidence of deep venous thrombosis.

## 2016-11-06 DIAGNOSIS — H9201 Otalgia, right ear: Secondary | ICD-10-CM | POA: Diagnosis not present

## 2016-11-06 DIAGNOSIS — H6121 Impacted cerumen, right ear: Secondary | ICD-10-CM | POA: Diagnosis not present

## 2016-11-15 DIAGNOSIS — H6123 Impacted cerumen, bilateral: Secondary | ICD-10-CM | POA: Diagnosis not present

## 2016-11-15 DIAGNOSIS — J301 Allergic rhinitis due to pollen: Secondary | ICD-10-CM | POA: Diagnosis not present

## 2016-12-11 DIAGNOSIS — E782 Mixed hyperlipidemia: Secondary | ICD-10-CM | POA: Diagnosis not present

## 2016-12-11 DIAGNOSIS — R739 Hyperglycemia, unspecified: Secondary | ICD-10-CM | POA: Diagnosis not present

## 2016-12-11 DIAGNOSIS — I10 Essential (primary) hypertension: Secondary | ICD-10-CM | POA: Diagnosis not present

## 2016-12-17 DIAGNOSIS — J4520 Mild intermittent asthma, uncomplicated: Secondary | ICD-10-CM | POA: Diagnosis not present

## 2016-12-17 DIAGNOSIS — I10 Essential (primary) hypertension: Secondary | ICD-10-CM | POA: Diagnosis not present

## 2016-12-17 DIAGNOSIS — I255 Ischemic cardiomyopathy: Secondary | ICD-10-CM | POA: Diagnosis not present

## 2016-12-17 DIAGNOSIS — R739 Hyperglycemia, unspecified: Secondary | ICD-10-CM | POA: Diagnosis not present

## 2016-12-17 DIAGNOSIS — D692 Other nonthrombocytopenic purpura: Secondary | ICD-10-CM | POA: Diagnosis not present

## 2016-12-17 DIAGNOSIS — I779 Disorder of arteries and arterioles, unspecified: Secondary | ICD-10-CM | POA: Diagnosis not present

## 2016-12-17 DIAGNOSIS — I480 Paroxysmal atrial fibrillation: Secondary | ICD-10-CM | POA: Diagnosis not present

## 2016-12-17 DIAGNOSIS — N183 Chronic kidney disease, stage 3 (moderate): Secondary | ICD-10-CM | POA: Diagnosis not present

## 2016-12-23 DIAGNOSIS — M5432 Sciatica, left side: Secondary | ICD-10-CM | POA: Diagnosis not present

## 2016-12-23 DIAGNOSIS — M79662 Pain in left lower leg: Secondary | ICD-10-CM | POA: Diagnosis not present

## 2016-12-24 DIAGNOSIS — I700 Atherosclerosis of aorta: Secondary | ICD-10-CM | POA: Insufficient documentation

## 2017-01-16 DIAGNOSIS — D2271 Melanocytic nevi of right lower limb, including hip: Secondary | ICD-10-CM | POA: Diagnosis not present

## 2017-01-16 DIAGNOSIS — D2262 Melanocytic nevi of left upper limb, including shoulder: Secondary | ICD-10-CM | POA: Diagnosis not present

## 2017-01-16 DIAGNOSIS — X32XXXA Exposure to sunlight, initial encounter: Secondary | ICD-10-CM | POA: Diagnosis not present

## 2017-01-16 DIAGNOSIS — L821 Other seborrheic keratosis: Secondary | ICD-10-CM | POA: Diagnosis not present

## 2017-01-16 DIAGNOSIS — L570 Actinic keratosis: Secondary | ICD-10-CM | POA: Diagnosis not present

## 2017-01-16 DIAGNOSIS — D485 Neoplasm of uncertain behavior of skin: Secondary | ICD-10-CM | POA: Diagnosis not present

## 2017-01-16 DIAGNOSIS — C44329 Squamous cell carcinoma of skin of other parts of face: Secondary | ICD-10-CM | POA: Diagnosis not present

## 2017-01-16 DIAGNOSIS — D2261 Melanocytic nevi of right upper limb, including shoulder: Secondary | ICD-10-CM | POA: Diagnosis not present

## 2017-01-23 DIAGNOSIS — L905 Scar conditions and fibrosis of skin: Secondary | ICD-10-CM | POA: Diagnosis not present

## 2017-01-23 DIAGNOSIS — C44329 Squamous cell carcinoma of skin of other parts of face: Secondary | ICD-10-CM | POA: Diagnosis not present

## 2017-01-23 DIAGNOSIS — D0439 Carcinoma in situ of skin of other parts of face: Secondary | ICD-10-CM | POA: Diagnosis not present

## 2017-02-11 DIAGNOSIS — H6123 Impacted cerumen, bilateral: Secondary | ICD-10-CM | POA: Diagnosis not present

## 2017-02-11 DIAGNOSIS — R079 Chest pain, unspecified: Secondary | ICD-10-CM | POA: Diagnosis not present

## 2017-02-11 DIAGNOSIS — R05 Cough: Secondary | ICD-10-CM | POA: Diagnosis not present

## 2017-02-11 DIAGNOSIS — Z23 Encounter for immunization: Secondary | ICD-10-CM | POA: Diagnosis not present

## 2017-04-18 ENCOUNTER — Other Ambulatory Visit: Payer: Self-pay | Admitting: Otolaryngology

## 2017-04-18 DIAGNOSIS — H6123 Impacted cerumen, bilateral: Secondary | ICD-10-CM | POA: Diagnosis not present

## 2017-04-18 DIAGNOSIS — R1314 Dysphagia, pharyngoesophageal phase: Secondary | ICD-10-CM | POA: Diagnosis not present

## 2017-04-18 DIAGNOSIS — R1310 Dysphagia, unspecified: Secondary | ICD-10-CM

## 2017-04-18 DIAGNOSIS — K219 Gastro-esophageal reflux disease without esophagitis: Secondary | ICD-10-CM | POA: Diagnosis not present

## 2017-04-23 ENCOUNTER — Ambulatory Visit
Admission: RE | Admit: 2017-04-23 | Discharge: 2017-04-23 | Disposition: A | Discharge status: Home / Self Care | Payer: PPO | Source: Ambulatory Visit | Attending: Otolaryngology | Admitting: Otolaryngology

## 2017-04-23 DIAGNOSIS — K449 Diaphragmatic hernia without obstruction or gangrene: Secondary | ICD-10-CM | POA: Diagnosis not present

## 2017-04-23 DIAGNOSIS — K222 Esophageal obstruction: Secondary | ICD-10-CM | POA: Diagnosis not present

## 2017-04-23 DIAGNOSIS — R1310 Dysphagia, unspecified: Secondary | ICD-10-CM | POA: Diagnosis not present

## 2017-04-23 DIAGNOSIS — K228 Other specified diseases of esophagus: Secondary | ICD-10-CM | POA: Diagnosis not present

## 2017-05-08 DIAGNOSIS — D0439 Carcinoma in situ of skin of other parts of face: Secondary | ICD-10-CM | POA: Diagnosis not present

## 2017-05-08 DIAGNOSIS — Z08 Encounter for follow-up examination after completed treatment for malignant neoplasm: Secondary | ICD-10-CM | POA: Diagnosis not present

## 2017-05-08 DIAGNOSIS — D485 Neoplasm of uncertain behavior of skin: Secondary | ICD-10-CM | POA: Diagnosis not present

## 2017-05-08 DIAGNOSIS — Z85828 Personal history of other malignant neoplasm of skin: Secondary | ICD-10-CM | POA: Diagnosis not present

## 2017-06-03 ENCOUNTER — Ambulatory Visit: Payer: PPO | Admitting: Gastroenterology

## 2017-06-03 ENCOUNTER — Encounter: Payer: Self-pay | Admitting: Gastroenterology

## 2017-06-03 DIAGNOSIS — I779 Disorder of arteries and arterioles, unspecified: Secondary | ICD-10-CM | POA: Insufficient documentation

## 2017-06-03 DIAGNOSIS — R739 Hyperglycemia, unspecified: Secondary | ICD-10-CM | POA: Insufficient documentation

## 2017-06-03 DIAGNOSIS — I10 Essential (primary) hypertension: Secondary | ICD-10-CM | POA: Insufficient documentation

## 2017-06-03 DIAGNOSIS — D351 Benign neoplasm of parathyroid gland: Secondary | ICD-10-CM | POA: Insufficient documentation

## 2017-06-03 DIAGNOSIS — I255 Ischemic cardiomyopathy: Secondary | ICD-10-CM | POA: Insufficient documentation

## 2017-06-03 DIAGNOSIS — E782 Mixed hyperlipidemia: Secondary | ICD-10-CM | POA: Insufficient documentation

## 2017-06-03 DIAGNOSIS — J45909 Unspecified asthma, uncomplicated: Secondary | ICD-10-CM | POA: Insufficient documentation

## 2017-06-03 DIAGNOSIS — I739 Peripheral vascular disease, unspecified: Secondary | ICD-10-CM

## 2017-06-12 DIAGNOSIS — R739 Hyperglycemia, unspecified: Secondary | ICD-10-CM | POA: Diagnosis not present

## 2017-06-12 DIAGNOSIS — I255 Ischemic cardiomyopathy: Secondary | ICD-10-CM | POA: Diagnosis not present

## 2017-06-12 DIAGNOSIS — I10 Essential (primary) hypertension: Secondary | ICD-10-CM | POA: Diagnosis not present

## 2017-06-12 DIAGNOSIS — I779 Disorder of arteries and arterioles, unspecified: Secondary | ICD-10-CM | POA: Diagnosis not present

## 2017-06-19 DIAGNOSIS — I480 Paroxysmal atrial fibrillation: Secondary | ICD-10-CM | POA: Diagnosis not present

## 2017-06-19 DIAGNOSIS — I779 Disorder of arteries and arterioles, unspecified: Secondary | ICD-10-CM | POA: Diagnosis not present

## 2017-06-19 DIAGNOSIS — R399 Unspecified symptoms and signs involving the genitourinary system: Secondary | ICD-10-CM | POA: Diagnosis not present

## 2017-06-19 DIAGNOSIS — D692 Other nonthrombocytopenic purpura: Secondary | ICD-10-CM | POA: Diagnosis not present

## 2017-06-19 DIAGNOSIS — R739 Hyperglycemia, unspecified: Secondary | ICD-10-CM | POA: Diagnosis not present

## 2017-06-19 DIAGNOSIS — J4520 Mild intermittent asthma, uncomplicated: Secondary | ICD-10-CM | POA: Diagnosis not present

## 2017-06-19 DIAGNOSIS — Z0000 Encounter for general adult medical examination without abnormal findings: Secondary | ICD-10-CM | POA: Diagnosis not present

## 2017-06-19 DIAGNOSIS — E782 Mixed hyperlipidemia: Secondary | ICD-10-CM | POA: Diagnosis not present

## 2017-06-19 DIAGNOSIS — I255 Ischemic cardiomyopathy: Secondary | ICD-10-CM | POA: Diagnosis not present

## 2017-06-20 DIAGNOSIS — I779 Disorder of arteries and arterioles, unspecified: Secondary | ICD-10-CM | POA: Diagnosis not present

## 2017-06-20 DIAGNOSIS — E782 Mixed hyperlipidemia: Secondary | ICD-10-CM | POA: Diagnosis not present

## 2017-06-20 DIAGNOSIS — I480 Paroxysmal atrial fibrillation: Secondary | ICD-10-CM | POA: Diagnosis not present

## 2017-06-20 DIAGNOSIS — R739 Hyperglycemia, unspecified: Secondary | ICD-10-CM | POA: Diagnosis not present

## 2017-06-20 DIAGNOSIS — Z0000 Encounter for general adult medical examination without abnormal findings: Secondary | ICD-10-CM | POA: Diagnosis not present

## 2017-06-20 DIAGNOSIS — J4520 Mild intermittent asthma, uncomplicated: Secondary | ICD-10-CM | POA: Diagnosis not present

## 2017-06-20 DIAGNOSIS — I255 Ischemic cardiomyopathy: Secondary | ICD-10-CM | POA: Diagnosis not present

## 2017-06-20 DIAGNOSIS — R399 Unspecified symptoms and signs involving the genitourinary system: Secondary | ICD-10-CM | POA: Diagnosis not present

## 2017-06-20 DIAGNOSIS — D692 Other nonthrombocytopenic purpura: Secondary | ICD-10-CM | POA: Diagnosis not present

## 2017-07-15 DIAGNOSIS — J069 Acute upper respiratory infection, unspecified: Secondary | ICD-10-CM | POA: Diagnosis not present

## 2017-07-15 DIAGNOSIS — I480 Paroxysmal atrial fibrillation: Secondary | ICD-10-CM | POA: Diagnosis not present

## 2017-07-15 DIAGNOSIS — I10 Essential (primary) hypertension: Secondary | ICD-10-CM | POA: Diagnosis not present

## 2017-09-17 DIAGNOSIS — H16223 Keratoconjunctivitis sicca, not specified as Sjogren's, bilateral: Secondary | ICD-10-CM | POA: Diagnosis not present

## 2017-10-01 DIAGNOSIS — H168 Other keratitis: Secondary | ICD-10-CM | POA: Diagnosis not present

## 2017-11-12 DIAGNOSIS — H16223 Keratoconjunctivitis sicca, not specified as Sjogren's, bilateral: Secondary | ICD-10-CM | POA: Diagnosis not present

## 2017-11-26 DIAGNOSIS — H16223 Keratoconjunctivitis sicca, not specified as Sjogren's, bilateral: Secondary | ICD-10-CM | POA: Diagnosis not present

## 2017-12-11 DIAGNOSIS — R739 Hyperglycemia, unspecified: Secondary | ICD-10-CM | POA: Diagnosis not present

## 2017-12-11 DIAGNOSIS — I779 Disorder of arteries and arterioles, unspecified: Secondary | ICD-10-CM | POA: Diagnosis not present

## 2017-12-11 DIAGNOSIS — E782 Mixed hyperlipidemia: Secondary | ICD-10-CM | POA: Diagnosis not present

## 2017-12-18 DIAGNOSIS — D692 Other nonthrombocytopenic purpura: Secondary | ICD-10-CM | POA: Diagnosis not present

## 2017-12-18 DIAGNOSIS — J4520 Mild intermittent asthma, uncomplicated: Secondary | ICD-10-CM | POA: Diagnosis not present

## 2017-12-18 DIAGNOSIS — N183 Chronic kidney disease, stage 3 (moderate): Secondary | ICD-10-CM | POA: Diagnosis not present

## 2017-12-18 DIAGNOSIS — I779 Disorder of arteries and arterioles, unspecified: Secondary | ICD-10-CM | POA: Diagnosis not present

## 2017-12-18 DIAGNOSIS — I255 Ischemic cardiomyopathy: Secondary | ICD-10-CM | POA: Diagnosis not present

## 2017-12-18 DIAGNOSIS — M791 Myalgia, unspecified site: Secondary | ICD-10-CM | POA: Diagnosis not present

## 2017-12-18 DIAGNOSIS — R739 Hyperglycemia, unspecified: Secondary | ICD-10-CM | POA: Diagnosis not present

## 2017-12-18 DIAGNOSIS — I480 Paroxysmal atrial fibrillation: Secondary | ICD-10-CM | POA: Diagnosis not present

## 2017-12-18 DIAGNOSIS — I700 Atherosclerosis of aorta: Secondary | ICD-10-CM | POA: Diagnosis not present

## 2017-12-24 DIAGNOSIS — H16223 Keratoconjunctivitis sicca, not specified as Sjogren's, bilateral: Secondary | ICD-10-CM | POA: Diagnosis not present

## 2018-02-25 DIAGNOSIS — J40 Bronchitis, not specified as acute or chronic: Secondary | ICD-10-CM | POA: Diagnosis not present

## 2018-03-01 DIAGNOSIS — R05 Cough: Secondary | ICD-10-CM | POA: Diagnosis not present

## 2018-03-01 DIAGNOSIS — J45901 Unspecified asthma with (acute) exacerbation: Secondary | ICD-10-CM | POA: Diagnosis not present

## 2018-04-16 DIAGNOSIS — J301 Allergic rhinitis due to pollen: Secondary | ICD-10-CM | POA: Diagnosis not present

## 2018-04-16 DIAGNOSIS — R1314 Dysphagia, pharyngoesophageal phase: Secondary | ICD-10-CM | POA: Diagnosis not present

## 2018-04-16 DIAGNOSIS — K219 Gastro-esophageal reflux disease without esophagitis: Secondary | ICD-10-CM | POA: Diagnosis not present

## 2018-04-29 DIAGNOSIS — D692 Other nonthrombocytopenic purpura: Secondary | ICD-10-CM | POA: Diagnosis not present

## 2018-04-29 DIAGNOSIS — I10 Essential (primary) hypertension: Secondary | ICD-10-CM | POA: Diagnosis not present

## 2018-04-29 DIAGNOSIS — N183 Chronic kidney disease, stage 3 (moderate): Secondary | ICD-10-CM | POA: Diagnosis not present

## 2018-04-29 DIAGNOSIS — Z9989 Dependence on other enabling machines and devices: Secondary | ICD-10-CM | POA: Diagnosis not present

## 2018-04-29 DIAGNOSIS — I480 Paroxysmal atrial fibrillation: Secondary | ICD-10-CM | POA: Diagnosis not present

## 2018-04-29 DIAGNOSIS — I700 Atherosclerosis of aorta: Secondary | ICD-10-CM | POA: Diagnosis not present

## 2018-04-29 DIAGNOSIS — M791 Myalgia, unspecified site: Secondary | ICD-10-CM | POA: Diagnosis not present

## 2018-04-29 DIAGNOSIS — R739 Hyperglycemia, unspecified: Secondary | ICD-10-CM | POA: Diagnosis not present

## 2018-04-29 DIAGNOSIS — I255 Ischemic cardiomyopathy: Secondary | ICD-10-CM | POA: Diagnosis not present

## 2018-04-29 DIAGNOSIS — E782 Mixed hyperlipidemia: Secondary | ICD-10-CM | POA: Diagnosis not present

## 2018-04-29 DIAGNOSIS — I739 Peripheral vascular disease, unspecified: Secondary | ICD-10-CM | POA: Diagnosis not present

## 2018-05-07 ENCOUNTER — Ambulatory Visit (INDEPENDENT_AMBULATORY_CARE_PROVIDER_SITE_OTHER): Payer: PPO | Admitting: Gastroenterology

## 2018-05-07 VITALS — BP 153/73 | HR 81 | Ht <= 58 in | Wt 105.0 lb

## 2018-05-07 DIAGNOSIS — R1310 Dysphagia, unspecified: Secondary | ICD-10-CM | POA: Diagnosis not present

## 2018-05-07 DIAGNOSIS — H01022 Squamous blepharitis right lower eyelid: Secondary | ICD-10-CM | POA: Diagnosis not present

## 2018-05-07 NOTE — Progress Notes (Signed)
Jonathon Bellows MD, MRCP(U.K) [address]  [place]  [place], [place] [address]  Main: [phone]  Fax: [phone]   Gastroenterology Consultation  Referring Provider:     Carloyn Manner, MD Primary Care Physician:  Kirk Ruths, MD Primary Gastroenterologist:  Dr. Jonathon Bellows  Reason for Consultation:     Dysphagia        HPI:   Anna Barrett is a 84 y.o. y/o female referred for consultation & management  by Dr. Ouida Sills, Ocie Cornfield, MD.    She has been referred for dysphagia.  She carries a diagnosis of ischemic cardiomyopathy.  A barium swallow was performed on 04/23/2017 and he demonstrated a distal esophageal stricture just proximal to a small partially reducible hiatal hernia.   Dysphagia: Onset and any progression: ongoing for years, stable, usually complains while eating , eats some , stops , recently she has had issues with breathing and got worse  Frequency: every time she eats Foods affected : solids - more for meats  Prior episodes of impaction: no  History of asthma/allergy : no  History of heartburn/Reflux : no  Weight loss/weight gain : yes   Prior EGD: no  PPI/H2 blocker use : no    Past Medical History:  Diagnosis Date  . History of MI (myocardial infarction)   . Hyperlipidemia   . Hypertension   . Myocardial infarction 1962  . Osteoporosis     Past Surgical History:  Procedure Laterality Date  . CATARACT EXTRACTION Bilateral   . ORIF ANKLE FRACTURE Left 04/17/2015   Procedure: OPEN REDUCTION INTERNAL FIXATION (ORIF) ANKLE FRACTURE;  Surgeon: Corky Mull, MD;  Location: [facility] ORS;  Service: Orthopedics;  Laterality: Left;    Prior to Admission medications   Medication Sig Start Date End Date Taking? Authorizing Provider  albuterol (PROVENTIL HFA;VENTOLIN HFA) 108 (90 BASE) MCG/ACT inhaler Inhale 1-2 puffs into the lungs every 6 (six) hours as needed for wheezing or shortness of breath.    [provider]  aspirin EC 325  MG tablet Take 1 tablet (325 mg total) by mouth daily. 04/19/15   Gladstone Lighter, MD  azelastine (ASTELIN) 0.1 % nasal spray Place into the nose. 06/18/16 06/18/17  [provider]  CARTIA XT 120 MG 24 hr capsule Take 1 capsule by mouth daily. 04/05/15   [provider]  Cholecalciferol (D 2000) 2000 UNITS TABS Take 2,000 Units by mouth daily.    [provider]  docusate sodium (COLACE) 100 MG capsule Take 1 capsule (100 mg total) by mouth 2 (two) times daily. 04/19/15   Gladstone Lighter, MD  ferrous sulfate 325 (65 FE) MG tablet Take 1 tablet (325 mg total) by mouth 2 (two) times daily with a meal. 04/19/15   Gladstone Lighter, MD  gabapentin (NEURONTIN) 100 MG capsule Take by mouth. 12/23/16 12/23/17  [provider]  lovastatin (MEVACOR) 20 MG tablet Take 20 mg by mouth at bedtime.    [provider]  metoprolol succinate (TOPROL-XL) 25 MG 24 hr tablet Take by mouth. 02/11/17 02/11/18  [provider]  oxyCODONE (OXY IR/ROXICODONE) 5 MG immediate release tablet Take 1-2 tablets (5-10 mg total) by mouth every 4 (four) hours as needed for breakthrough pain. 04/19/15   Lattie Corns, PA-C  senna (SENOKOT) 8.6 MG TABS tablet Take 1 tablet (8.6 mg total) by mouth 2 (two) times daily as needed for mild constipation. 04/19/15   Gladstone Lighter, MD  torsemide (DEMADEX) 5 MG tablet Take  1 tablet by mouth daily. 03/16/15   [provider]    Family History  Problem Relation Age of Onset  . Heart disease Mother   . Heart disease Father      Social History   Tobacco Use  . Smoking status: Never Smoker  Substance Use Topics  . Alcohol use: No  . Drug use: Not on file    Allergies as of 05/07/2018 - Review Complete 04/17/2015  Allergen Reaction Noted  . Atorvastatin Other (See Comments) 04/28/2003  . Iodine Other (See Comments) 04/17/2015  . Shellfish allergy Other (See Comments) 11/10/2007    Review of Systems:    All  systems reviewed and negative except where noted in HPI.   Physical Exam:  There were no vitals taken for this visit. No LMP recorded. Patient is postmenopausal. Psych:  Alert and cooperative. Normal mood and affect. General:   Alert,  Well-developed, well-nourished, pleasant and cooperative in NAD Head:  Normocephalic and atraumatic. Eyes:  Sclera clear, no icterus.   Conjunctiva pink. Ears:  Normal auditory acuity. Nose:  No deformity, discharge, or lesions. Mouth:  No deformity or lesions,oropharynx pink & moist. Neck:  Supple; no masses or thyromegaly. Lungs:  Respirations even and unlabored.  Clear throughout to auscultation.   No wheezes, crackles, or rhonchi. No acute distress. Heart:  Regular rate and rhythm; no murmurs, clicks, rubs, or gallops. Abdomen:  Normal bowel sounds.  No bruits.  Soft, non-tender and non-distended without masses, hepatosplenomegaly or hernias noted.  No guarding or rebound tenderness.    Neurologic:  Alert and oriented x3;  grossly normal neurologically. Skin:  Intact without significant lesions or rashes. No jaundice. Lymph Nodes:  No significant cervical adenopathy. Psych:  Alert and cooperative. Normal mood and affect.  Imaging Studies: No results found.  Assessment and Plan:   Anna Barrett is a 84 y.o. y/o female has been referred for dysphagia.  Barium swallow suggests a stricture in the distal esophagus.  She is not on any blood thinners.  Plan  1. EGD recommended, patient and son would like to think about it . Information provided , discussed diffentials including peptic stricture vs malignancy  2. Pepcid daily OTC  I have discussed alternative options, risks & benefits,  which include, but are not limited to, bleeding, infection, perforation,respiratory complication & drug reaction.  The patient agrees with this plan & written consent will be obtained.     Follow up PRN  Dr Jonathon Bellows MD,MRCP(U.K)

## 2018-06-22 DIAGNOSIS — H353131 Nonexudative age-related macular degeneration, bilateral, early dry stage: Secondary | ICD-10-CM | POA: Diagnosis not present

## 2018-07-07 DIAGNOSIS — K5900 Constipation, unspecified: Secondary | ICD-10-CM | POA: Diagnosis not present

## 2018-08-26 DIAGNOSIS — B379 Candidiasis, unspecified: Secondary | ICD-10-CM | POA: Diagnosis not present

## 2018-09-03 DIAGNOSIS — D0471 Carcinoma in situ of skin of right lower limb, including hip: Secondary | ICD-10-CM | POA: Diagnosis not present

## 2018-09-03 DIAGNOSIS — K5900 Constipation, unspecified: Secondary | ICD-10-CM | POA: Diagnosis not present

## 2018-09-03 DIAGNOSIS — D485 Neoplasm of uncertain behavior of skin: Secondary | ICD-10-CM | POA: Diagnosis not present

## 2018-10-15 DIAGNOSIS — I779 Disorder of arteries and arterioles, unspecified: Secondary | ICD-10-CM | POA: Diagnosis not present

## 2018-10-15 DIAGNOSIS — E7800 Pure hypercholesterolemia, unspecified: Secondary | ICD-10-CM | POA: Diagnosis not present

## 2018-10-15 DIAGNOSIS — R739 Hyperglycemia, unspecified: Secondary | ICD-10-CM | POA: Diagnosis not present

## 2018-10-15 DIAGNOSIS — I480 Paroxysmal atrial fibrillation: Secondary | ICD-10-CM | POA: Diagnosis not present

## 2018-10-15 DIAGNOSIS — N183 Chronic kidney disease, stage 3 (moderate): Secondary | ICD-10-CM | POA: Diagnosis not present

## 2018-10-22 DIAGNOSIS — I480 Paroxysmal atrial fibrillation: Secondary | ICD-10-CM | POA: Diagnosis not present

## 2018-10-22 DIAGNOSIS — I255 Ischemic cardiomyopathy: Secondary | ICD-10-CM | POA: Diagnosis not present

## 2018-10-22 DIAGNOSIS — Z0000 Encounter for general adult medical examination without abnormal findings: Secondary | ICD-10-CM | POA: Diagnosis not present

## 2018-10-22 DIAGNOSIS — N183 Chronic kidney disease, stage 3 (moderate): Secondary | ICD-10-CM | POA: Diagnosis not present

## 2018-10-22 DIAGNOSIS — I700 Atherosclerosis of aorta: Secondary | ICD-10-CM | POA: Diagnosis not present

## 2018-10-22 DIAGNOSIS — Z9989 Dependence on other enabling machines and devices: Secondary | ICD-10-CM | POA: Diagnosis not present

## 2018-10-22 DIAGNOSIS — I779 Disorder of arteries and arterioles, unspecified: Secondary | ICD-10-CM | POA: Diagnosis not present

## 2018-10-22 DIAGNOSIS — R739 Hyperglycemia, unspecified: Secondary | ICD-10-CM | POA: Diagnosis not present

## 2018-10-22 DIAGNOSIS — D692 Other nonthrombocytopenic purpura: Secondary | ICD-10-CM | POA: Diagnosis not present

## 2018-12-29 DIAGNOSIS — H169 Unspecified keratitis: Secondary | ICD-10-CM | POA: Diagnosis not present

## 2019-01-19 DIAGNOSIS — H353131 Nonexudative age-related macular degeneration, bilateral, early dry stage: Secondary | ICD-10-CM | POA: Diagnosis not present

## 2019-02-03 DIAGNOSIS — H16223 Keratoconjunctivitis sicca, not specified as Sjogren's, bilateral: Secondary | ICD-10-CM | POA: Diagnosis not present

## 2019-04-28 DIAGNOSIS — J4520 Mild intermittent asthma, uncomplicated: Secondary | ICD-10-CM | POA: Diagnosis not present

## 2019-04-28 DIAGNOSIS — R739 Hyperglycemia, unspecified: Secondary | ICD-10-CM | POA: Diagnosis not present

## 2019-04-28 DIAGNOSIS — M791 Myalgia, unspecified site: Secondary | ICD-10-CM | POA: Diagnosis not present

## 2019-04-28 DIAGNOSIS — N183 Chronic kidney disease, stage 3 unspecified: Secondary | ICD-10-CM | POA: Diagnosis not present

## 2019-04-28 DIAGNOSIS — I779 Disorder of arteries and arterioles, unspecified: Secondary | ICD-10-CM | POA: Diagnosis not present

## 2019-04-28 DIAGNOSIS — Z9989 Dependence on other enabling machines and devices: Secondary | ICD-10-CM | POA: Diagnosis not present

## 2019-04-28 DIAGNOSIS — N1831 Chronic kidney disease, stage 3a: Secondary | ICD-10-CM | POA: Diagnosis not present

## 2019-04-28 DIAGNOSIS — T466X5A Adverse effect of antihyperlipidemic and antiarteriosclerotic drugs, initial encounter: Secondary | ICD-10-CM | POA: Diagnosis not present

## 2019-04-28 DIAGNOSIS — I255 Ischemic cardiomyopathy: Secondary | ICD-10-CM | POA: Diagnosis not present

## 2019-04-28 DIAGNOSIS — D692 Other nonthrombocytopenic purpura: Secondary | ICD-10-CM | POA: Diagnosis not present

## 2019-04-28 DIAGNOSIS — I480 Paroxysmal atrial fibrillation: Secondary | ICD-10-CM | POA: Diagnosis not present

## 2019-04-28 DIAGNOSIS — I700 Atherosclerosis of aorta: Secondary | ICD-10-CM | POA: Diagnosis not present

## 2019-05-04 DIAGNOSIS — D485 Neoplasm of uncertain behavior of skin: Secondary | ICD-10-CM | POA: Diagnosis not present

## 2019-05-04 DIAGNOSIS — R208 Other disturbances of skin sensation: Secondary | ICD-10-CM | POA: Diagnosis not present

## 2019-05-04 DIAGNOSIS — D0471 Carcinoma in situ of skin of right lower limb, including hip: Secondary | ICD-10-CM | POA: Diagnosis not present

## 2019-05-04 DIAGNOSIS — L570 Actinic keratosis: Secondary | ICD-10-CM | POA: Diagnosis not present

## 2019-05-11 DIAGNOSIS — J069 Acute upper respiratory infection, unspecified: Secondary | ICD-10-CM | POA: Diagnosis not present

## 2019-05-12 DIAGNOSIS — J069 Acute upper respiratory infection, unspecified: Secondary | ICD-10-CM | POA: Diagnosis not present

## 2019-05-26 DIAGNOSIS — Z20822 Contact with and (suspected) exposure to covid-19: Secondary | ICD-10-CM | POA: Diagnosis not present

## 2019-08-30 DIAGNOSIS — R1012 Left upper quadrant pain: Secondary | ICD-10-CM | POA: Diagnosis not present

## 2019-09-01 DIAGNOSIS — R1012 Left upper quadrant pain: Secondary | ICD-10-CM | POA: Diagnosis not present

## 2019-10-07 DIAGNOSIS — D485 Neoplasm of uncertain behavior of skin: Secondary | ICD-10-CM | POA: Diagnosis not present

## 2019-10-07 DIAGNOSIS — L820 Inflamed seborrheic keratosis: Secondary | ICD-10-CM | POA: Diagnosis not present

## 2019-10-07 DIAGNOSIS — C44629 Squamous cell carcinoma of skin of left upper limb, including shoulder: Secondary | ICD-10-CM | POA: Diagnosis not present

## 2019-10-21 DIAGNOSIS — C44629 Squamous cell carcinoma of skin of left upper limb, including shoulder: Secondary | ICD-10-CM | POA: Diagnosis not present

## 2019-11-04 DIAGNOSIS — D485 Neoplasm of uncertain behavior of skin: Secondary | ICD-10-CM | POA: Diagnosis not present

## 2019-11-04 DIAGNOSIS — D0461 Carcinoma in situ of skin of right upper limb, including shoulder: Secondary | ICD-10-CM | POA: Diagnosis not present

## 2019-12-29 DIAGNOSIS — H168 Other keratitis: Secondary | ICD-10-CM | POA: Diagnosis not present

## 2020-01-25 DIAGNOSIS — Z20822 Contact with and (suspected) exposure to covid-19: Secondary | ICD-10-CM | POA: Diagnosis not present

## 2020-02-29 DIAGNOSIS — I10 Essential (primary) hypertension: Secondary | ICD-10-CM | POA: Diagnosis not present

## 2020-02-29 DIAGNOSIS — N1831 Chronic kidney disease, stage 3a: Secondary | ICD-10-CM | POA: Diagnosis not present

## 2020-02-29 DIAGNOSIS — I700 Atherosclerosis of aorta: Secondary | ICD-10-CM | POA: Diagnosis not present

## 2020-02-29 DIAGNOSIS — I255 Ischemic cardiomyopathy: Secondary | ICD-10-CM | POA: Diagnosis not present

## 2020-02-29 DIAGNOSIS — Z23 Encounter for immunization: Secondary | ICD-10-CM | POA: Diagnosis not present

## 2020-02-29 DIAGNOSIS — I480 Paroxysmal atrial fibrillation: Secondary | ICD-10-CM | POA: Diagnosis not present

## 2020-02-29 DIAGNOSIS — Z9989 Dependence on other enabling machines and devices: Secondary | ICD-10-CM | POA: Diagnosis not present

## 2020-02-29 DIAGNOSIS — R1319 Other dysphagia: Secondary | ICD-10-CM | POA: Diagnosis not present

## 2020-02-29 DIAGNOSIS — Z0000 Encounter for general adult medical examination without abnormal findings: Secondary | ICD-10-CM | POA: Diagnosis not present

## 2020-02-29 DIAGNOSIS — R739 Hyperglycemia, unspecified: Secondary | ICD-10-CM | POA: Diagnosis not present

## 2020-02-29 DIAGNOSIS — M791 Myalgia, unspecified site: Secondary | ICD-10-CM | POA: Diagnosis not present

## 2020-02-29 DIAGNOSIS — I779 Disorder of arteries and arterioles, unspecified: Secondary | ICD-10-CM | POA: Diagnosis not present

## 2020-02-29 DIAGNOSIS — D692 Other nonthrombocytopenic purpura: Secondary | ICD-10-CM | POA: Diagnosis not present

## 2020-03-01 ENCOUNTER — Other Ambulatory Visit: Payer: Self-pay | Admitting: Gastroenterology

## 2020-03-01 DIAGNOSIS — R1310 Dysphagia, unspecified: Secondary | ICD-10-CM

## 2020-03-02 ENCOUNTER — Other Ambulatory Visit: Payer: Self-pay

## 2020-03-02 ENCOUNTER — Ambulatory Visit
Admission: RE | Admit: 2020-03-02 | Discharge: 2020-03-02 | Disposition: A | Discharge status: Home / Self Care | Payer: PPO | Source: Ambulatory Visit | Attending: Gastroenterology | Admitting: Gastroenterology

## 2020-03-02 DIAGNOSIS — R1310 Dysphagia, unspecified: Secondary | ICD-10-CM | POA: Insufficient documentation

## 2020-03-02 DIAGNOSIS — K2289 Other specified disease of esophagus: Secondary | ICD-10-CM | POA: Diagnosis not present

## 2020-03-06 DIAGNOSIS — R1319 Other dysphagia: Secondary | ICD-10-CM | POA: Diagnosis not present

## 2020-03-06 DIAGNOSIS — I160 Hypertensive urgency: Secondary | ICD-10-CM | POA: Diagnosis not present

## 2020-03-06 DIAGNOSIS — R634 Abnormal weight loss: Secondary | ICD-10-CM | POA: Diagnosis not present

## 2020-03-06 DIAGNOSIS — K449 Diaphragmatic hernia without obstruction or gangrene: Secondary | ICD-10-CM | POA: Diagnosis not present

## 2020-03-06 DIAGNOSIS — K222 Esophageal obstruction: Secondary | ICD-10-CM | POA: Diagnosis not present

## 2020-03-06 DIAGNOSIS — K2289 Other specified disease of esophagus: Secondary | ICD-10-CM | POA: Diagnosis not present

## 2020-03-15 DIAGNOSIS — M791 Myalgia, unspecified site: Secondary | ICD-10-CM | POA: Diagnosis not present

## 2020-03-15 DIAGNOSIS — I480 Paroxysmal atrial fibrillation: Secondary | ICD-10-CM | POA: Diagnosis not present

## 2020-03-15 DIAGNOSIS — D692 Other nonthrombocytopenic purpura: Secondary | ICD-10-CM | POA: Diagnosis not present

## 2020-03-15 DIAGNOSIS — I10 Essential (primary) hypertension: Secondary | ICD-10-CM | POA: Diagnosis not present

## 2020-03-15 DIAGNOSIS — Z9989 Dependence on other enabling machines and devices: Secondary | ICD-10-CM | POA: Diagnosis not present

## 2020-03-15 DIAGNOSIS — R739 Hyperglycemia, unspecified: Secondary | ICD-10-CM | POA: Diagnosis not present

## 2020-03-15 DIAGNOSIS — N1831 Chronic kidney disease, stage 3a: Secondary | ICD-10-CM | POA: Diagnosis not present

## 2020-03-15 DIAGNOSIS — I700 Atherosclerosis of aorta: Secondary | ICD-10-CM | POA: Diagnosis not present

## 2020-03-15 DIAGNOSIS — T466X5A Adverse effect of antihyperlipidemic and antiarteriosclerotic drugs, initial encounter: Secondary | ICD-10-CM | POA: Diagnosis not present

## 2020-03-15 DIAGNOSIS — I779 Disorder of arteries and arterioles, unspecified: Secondary | ICD-10-CM | POA: Diagnosis not present

## 2020-03-15 DIAGNOSIS — J4520 Mild intermittent asthma, uncomplicated: Secondary | ICD-10-CM | POA: Diagnosis not present

## 2020-03-31 DIAGNOSIS — J4520 Mild intermittent asthma, uncomplicated: Secondary | ICD-10-CM | POA: Diagnosis not present

## 2020-03-31 DIAGNOSIS — J40 Bronchitis, not specified as acute or chronic: Secondary | ICD-10-CM | POA: Diagnosis not present

## 2020-04-04 ENCOUNTER — Encounter: Payer: Self-pay | Admitting: Internal Medicine

## 2020-04-07 ENCOUNTER — Other Ambulatory Visit
Admission: RE | Admit: 2020-04-07 | Discharge: 2020-04-07 | Disposition: A | Discharge status: Home / Self Care | Payer: PPO | Source: Ambulatory Visit | Attending: Internal Medicine | Admitting: Internal Medicine

## 2020-04-07 ENCOUNTER — Other Ambulatory Visit: Payer: Self-pay

## 2020-04-07 DIAGNOSIS — Z20822 Contact with and (suspected) exposure to covid-19: Secondary | ICD-10-CM | POA: Insufficient documentation

## 2020-04-07 DIAGNOSIS — Z01818 Encounter for other preprocedural examination: Secondary | ICD-10-CM | POA: Insufficient documentation

## 2020-04-08 LAB — SARS CORONAVIRUS 2 (TAT 6-24 HRS): SARS Coronavirus 2: NEGATIVE

## 2020-04-10 ENCOUNTER — Other Ambulatory Visit: Payer: Self-pay

## 2020-04-10 ENCOUNTER — Encounter
Admission: RE | Disposition: A | Discharge status: Home / Self Care | Payer: Self-pay | Source: Home / Self Care | Attending: Internal Medicine

## 2020-04-10 ENCOUNTER — Ambulatory Visit
Admission: RE | Admit: 2020-04-10 | Discharge: 2020-04-10 | Disposition: A | Discharge status: Home / Self Care | Payer: PPO | Attending: Internal Medicine | Admitting: Internal Medicine

## 2020-04-10 ENCOUNTER — Ambulatory Visit: Payer: PPO | Admitting: Certified Registered Nurse Anesthetist

## 2020-04-10 ENCOUNTER — Encounter: Payer: Self-pay | Admitting: Internal Medicine

## 2020-04-10 DIAGNOSIS — E7800 Pure hypercholesterolemia, unspecified: Secondary | ICD-10-CM | POA: Diagnosis not present

## 2020-04-10 DIAGNOSIS — N183 Chronic kidney disease, stage 3 unspecified: Secondary | ICD-10-CM | POA: Diagnosis not present

## 2020-04-10 DIAGNOSIS — K263 Acute duodenal ulcer without hemorrhage or perforation: Secondary | ICD-10-CM | POA: Diagnosis not present

## 2020-04-10 DIAGNOSIS — Z681 Body mass index (BMI) 19 or less, adult: Secondary | ICD-10-CM | POA: Insufficient documentation

## 2020-04-10 DIAGNOSIS — R634 Abnormal weight loss: Secondary | ICD-10-CM | POA: Insufficient documentation

## 2020-04-10 DIAGNOSIS — K2970 Gastritis, unspecified, without bleeding: Secondary | ICD-10-CM | POA: Diagnosis not present

## 2020-04-10 DIAGNOSIS — K449 Diaphragmatic hernia without obstruction or gangrene: Secondary | ICD-10-CM | POA: Diagnosis not present

## 2020-04-10 DIAGNOSIS — Z888 Allergy status to other drugs, medicaments and biological substances status: Secondary | ICD-10-CM | POA: Insufficient documentation

## 2020-04-10 DIAGNOSIS — K269 Duodenal ulcer, unspecified as acute or chronic, without hemorrhage or perforation: Secondary | ICD-10-CM | POA: Insufficient documentation

## 2020-04-10 DIAGNOSIS — K319 Disease of stomach and duodenum, unspecified: Secondary | ICD-10-CM | POA: Diagnosis not present

## 2020-04-10 DIAGNOSIS — K219 Gastro-esophageal reflux disease without esophagitis: Secondary | ICD-10-CM | POA: Insufficient documentation

## 2020-04-10 DIAGNOSIS — K209 Esophagitis, unspecified without bleeding: Secondary | ICD-10-CM | POA: Diagnosis not present

## 2020-04-10 DIAGNOSIS — K222 Esophageal obstruction: Secondary | ICD-10-CM | POA: Insufficient documentation

## 2020-04-10 DIAGNOSIS — Z91013 Allergy to seafood: Secondary | ICD-10-CM | POA: Insufficient documentation

## 2020-04-10 DIAGNOSIS — I129 Hypertensive chronic kidney disease with stage 1 through stage 4 chronic kidney disease, or unspecified chronic kidney disease: Secondary | ICD-10-CM | POA: Diagnosis not present

## 2020-04-10 DIAGNOSIS — Z79899 Other long term (current) drug therapy: Secondary | ICD-10-CM | POA: Insufficient documentation

## 2020-04-10 DIAGNOSIS — K3189 Other diseases of stomach and duodenum: Secondary | ICD-10-CM | POA: Diagnosis not present

## 2020-04-10 HISTORY — PX: ESOPHAGOGASTRODUODENOSCOPY (EGD) WITH PROPOFOL: SHX5813

## 2020-04-10 HISTORY — DX: Headache, unspecified: R51.9

## 2020-04-10 HISTORY — DX: Hyperglycemia, unspecified: R73.9

## 2020-04-10 HISTORY — DX: Ischemic cardiomyopathy: I25.5

## 2020-04-10 HISTORY — DX: Occlusion and stenosis of unspecified carotid artery: I65.29

## 2020-04-10 HISTORY — DX: Unspecified asthma, uncomplicated: J45.909

## 2020-04-10 HISTORY — DX: Benign neoplasm of parathyroid gland: D35.1

## 2020-04-10 SURGERY — ESOPHAGOGASTRODUODENOSCOPY (EGD) WITH PROPOFOL
Anesthesia: General

## 2020-04-10 MED ORDER — PROPOFOL 500 MG/50ML IV EMUL
INTRAVENOUS | Status: DC | PRN
  Administered 2020-04-10: 50 ug/kg/min via INTRAVENOUS

## 2020-04-10 MED ORDER — EPHEDRINE 5 MG/ML INJ
INTRAVENOUS | Status: AC
  Filled 2020-04-10: qty 10

## 2020-04-10 MED ORDER — GLYCOPYRROLATE 0.2 MG/ML IJ SOLN
INTRAMUSCULAR | Status: DC | PRN
  Administered 2020-04-10 (×2): .1 mg via INTRAVENOUS

## 2020-04-10 MED ORDER — SODIUM CHLORIDE 0.9 % IV SOLN: INTRAVENOUS | Status: DC

## 2020-04-10 MED ORDER — GLYCOPYRROLATE 0.2 MG/ML IJ SOLN
INTRAMUSCULAR | Status: AC
  Filled 2020-04-10: qty 1

## 2020-04-10 MED ORDER — LIDOCAINE HCL (CARDIAC) PF 100 MG/5ML IV SOSY
PREFILLED_SYRINGE | INTRAVENOUS | Status: DC | PRN
  Administered 2020-04-10: 60 mg via INTRAVENOUS

## 2020-04-10 MED ORDER — PROPOFOL 10 MG/ML IV BOLUS
INTRAVENOUS | Status: DC | PRN
  Administered 2020-04-10 (×4): 10 mg via INTRAVENOUS

## 2020-04-10 NOTE — Transfer of Care (Signed)
Immediate Anesthesia Transfer of Care Note  Patient: Anna Barrett  Procedure(s) Performed: ESOPHAGOGASTRODUODENOSCOPY (EGD) WITH PROPOFOL (N/A )  Patient Location: PACU and Endoscopy Unit  Anesthesia Type:General  Level of Consciousness: drowsy  Airway & Oxygen Therapy: Patient Spontanous Breathing  Post-op Assessment: Report given to RN and Post -op Vital signs reviewed and stable  Post vital signs: Reviewed and stable  Last Vitals:  Vitals Value Taken Time  BP 124/65 04/10/20 1040  Temp    Pulse 94 04/10/20 1040  Resp 36 04/10/20 1040  SpO2 97 % 04/10/20 1040  Vitals shown include unvalidated device data.  Last Pain:  Vitals:   04/10/20 0941  TempSrc: Temporal  PainSc: 0-No pain         Complications: No complications documented.

## 2020-04-10 NOTE — Anesthesia Preprocedure Evaluation (Signed)
Anesthesia Evaluation  Patient identified by MRN, date of birth, ID band Patient awake    Reviewed: Allergy & Precautions, NPO status , Patient's Chart, lab work & pertinent test results  History of Anesthesia Complications Negative for: history of anesthetic complications  Airway Mallampati: II       Dental  (+) Missing, Chipped, Poor Dentition   Pulmonary neg sleep apnea, neg COPD,           Cardiovascular hypertension, Pt. on medications + Past MI (pr denies)  (-) CHF (-) dysrhythmias (-) Valvular Problems/Murmurs     Neuro/Psych neg Seizures    GI/Hepatic neg GERD  ,  Endo/Other  neg diabetes  Renal/GU Renal InsufficiencyRenal disease     Musculoskeletal   Abdominal   Peds  Hematology   Anesthesia Other Findings   Reproductive/Obstetrics                            Anesthesia Physical Anesthesia Plan  ASA: III  Anesthesia Plan: General   Post-op Pain Management:    Induction: Intravenous  PONV Risk Score and Plan: 3 and Propofol infusion, TIVA and Treatment may vary due to age or medical condition  Airway Management Planned: Nasal Cannula  Additional Equipment:   Intra-op Plan:   Post-operative Plan:   Informed Consent: I have reviewed the patients History and Physical, chart, labs and discussed the procedure including the risks, benefits and alternatives for the proposed anesthesia with the patient or authorized representative who has indicated his/her understanding and acceptance.       Plan Discussed with:   Anesthesia Plan Comments:         Anesthesia Quick Evaluation

## 2020-04-10 NOTE — Anesthesia Postprocedure Evaluation (Signed)
Anesthesia Post Note  Patient: Anna Barrett  Procedure(s) Performed: ESOPHAGOGASTRODUODENOSCOPY (EGD) WITH PROPOFOL (N/A )  Patient location during evaluation: Endoscopy Anesthesia Type: General Level of consciousness: awake and alert Pain management: pain level controlled Vital Signs Assessment: post-procedure vital signs reviewed and stable Respiratory status: spontaneous breathing and respiratory function stable Cardiovascular status: stable Anesthetic complications: no   No complications documented.   Last Vitals:  Vitals:   04/10/20 1040 04/10/20 1050  BP: 124/65 (!) 126/57  Pulse: 94 93  Resp: (!) 39 20  Temp: 36.6 C   SpO2: 98% 98%    Last Pain:  Vitals:   04/10/20 1050  TempSrc:   PainSc: 0-No pain                 Delanie Tirrell K

## 2020-04-10 NOTE — Op Note (Signed)
[facility] Patient Name: Anna Barrett Procedure Date: 04/10/2020 10:13 AM MRN: [id number] Account #: [id number] Date of Birth: [birth year] Admit Type: Outpatient Age: 86 Room: [facility] ENDO ROOM 2 Gender: Female Note Status: Finalized Procedure:             Upper GI endoscopy Indications:           For dilation and stenting of esophageal stenosis,                         Dysphagia, Abnormal cine-esophagram, Endoscopy to                         confirm esophageal stricture that was demonstrated on                         previous imaging study Providers:             Benay Pike. Alice Reichert MD, MD Referring MD:          Ocie Cornfield. Ouida Sills MD, MD (Referring MD) Medicines:             Propofol per Anesthesia Complications:         No immediate complications. Estimated blood loss:                         Minimal. Procedure:             Pre-Anesthesia Assessment:                        - The risks and benefits of the procedure and the                         sedation options and risks were discussed with the                         patient. All questions were answered and informed                         consent was obtained.                        - Patient identification and proposed procedure were                         verified prior to the procedure by the nurse. The                         procedure was verified in the procedure room.                        - ASA Grade Assessment: III - A patient with severe                         systemic disease.                        - After reviewing the risks and benefits, the patient                         was deemed  in satisfactory condition to undergo the                         procedure.                        After obtaining informed consent, the endoscope was                         passed under direct vision. Throughout the procedure,                         the patient's blood pressure, pulse, and  oxygen                         saturations were monitored continuously. The Endoscope                         was introduced through the mouth, and advanced to the                         third part of duodenum. The upper GI endoscopy was                         accomplished without difficulty. The patient tolerated                         the procedure well. Findings:      One benign-appearing, intrinsic moderate stenosis was found in the       distal esophagus. This stenosis measured 1.3 cm (inner diameter) x less       than one cm (in length). The stenosis was traversed. A TTS dilator was       passed through the scope. Dilation with a 12-13.5-15 mm balloon and a       15-16.5-18 mm balloon dilator was performed to 16.5 mm. The dilation       site was examined following endoscope reinsertion and showed moderate       mucosal disruption. Estimated blood loss was minimal.      A 3 cm hiatal hernia was present.      Diffuse mild inflammation characterized by erythema, mild atrophy was       found in the entire examined stomach. Biopsies were taken with a cold       forceps for histology.      Many non-bleeding superficial duodenal ulcers with no stigmata of       bleeding were found in the duodenal bulb.      The second portion of the duodenum and third portion of the duodenum       were normal.      The exam was otherwise without abnormality. Impression:            - Benign-appearing esophageal stenosis. Dilated.                        - 3 cm hiatal hernia.                        - Gastritis. Biopsied.                        -  Non-bleeding duodenal ulcers with no stigmata of                         bleeding.                        - Normal second portion of the duodenum and third                         portion of the duodenum.                        - The examination was otherwise normal. Recommendation:        - Patient has a contact number available for                          emergencies. The signs and symptoms of potential                         delayed complications were discussed with the patient.                         Return to normal activities tomorrow. Written                         discharge instructions were provided to the patient.                        - Resume previous diet.                        - Continue present medications.                        - Await pathology results.                        - Return to physician assistant in 6 weeks.                        - Follow up with Octavia Bruckner, PA-C in 6 weeks                        - The findings and recommendations were discussed with                         the patient. Procedure Code(s):     --- Professional ---                        [phone], Esophagogastroduodenoscopy, flexible,                         transoral; with transendoscopic balloon dilation of                         esophagus (less than 30 mm diameter)                        43239, 59, Esophagogastroduodenoscopy, flexible,  transoral; with biopsy, single or multiple Diagnosis Code(s):     --- Professional ---                        R93.3, Abnormal findings on diagnostic imaging of                         other parts of digestive tract                        R13.10, Dysphagia, unspecified                        K26.9, Duodenal ulcer, unspecified as acute or                         chronic, without hemorrhage or perforation                        K29.70, Gastritis, unspecified, without bleeding                        K44.9, Diaphragmatic hernia without obstruction or                         gangrene                        K22.2, Esophageal obstruction CPT copyright 2019 American Medical Association. All rights reserved. The codes documented in this report are preliminary and upon coder review may  be revised to meet current compliance requirements. Efrain Sella MD, MD 04/10/2020 10:45:51 AM This  report has been signed electronically. Number of Addenda: 0 Note Initiated On: 04/10/2020 10:13 AM Estimated Blood Loss:  Estimated blood loss was minimal.      [facility]

## 2020-04-10 NOTE — Anesthesia Procedure Notes (Signed)
Procedure Name: MAC Date/Time: 04/10/2020 10:17 AM Performed by: Lily Peer, Kaly Mcquary, CRNA Pre-anesthesia Checklist: Patient identified, Emergency Drugs available, Patient being monitored and Suction available Oxygen Delivery Method: Nasal cannula

## 2020-04-10 NOTE — Interval H&P Note (Signed)
History and Physical Interval Note:  04/10/2020 10:14 AM  Anna Barrett  has presented today for surgery, with the diagnosis of ESOPHAGEAL STTRICTURE.  The various methods of treatment have been discussed with the patient and family. After consideration of risks, benefits and other options for treatment, the patient has consented to  Procedure(s): ESOPHAGOGASTRODUODENOSCOPY (EGD) WITH PROPOFOL (N/A) as a surgical intervention.  The patient's history has been reviewed, patient examined, no change in status, stable for surgery.  I have reviewed the patient's chart and labs.  Questions were answered to the patient's satisfaction.     [place], [place]

## 2020-04-10 NOTE — H&P (Signed)
Outpatient short stay form Pre-procedure 04/10/2020 10:11 AM Anna Barrett K. Alice Reichert, M.D.  Primary Physician: Frazier Richards, M.D.  Reason for visit:  Dysphagia, Esophageal stricture, hiatal hernia, weight loss. Abnormal barium swallow.   History of present illness:  Patient is a 86 y/o female with history of dysphagia, GERD, abnormal barium swallow showing distal esophageal stricture (chronic), hiatal hernia, mild aspiration. Has lost about 10 lb over the past year. No hememesis or abdominal pain.    Current Facility-Administered Medications:  .  0.9 %  sodium chloride infusion, , Intravenous, Continuous, [place], Benay Pike, MD, Last Rate: 20 mL/hr at 04/10/20 1009, Continued from Pre-op at 04/10/20 1009  Medications Prior to Admission  Medication Sig Dispense Refill Last Dose  . cloNIDine (CATAPRES - DOSED IN MG/24 HR) 0.1 mg/24hr patch Place 0.1 mg onto the skin once a week.     Marland Kitchen albuterol (PROVENTIL HFA;VENTOLIN HFA) 108 (90 BASE) MCG/ACT inhaler Inhale 1-2 puffs into the lungs every 6 (six) hours as needed for wheezing or shortness of breath.     Marland Kitchen aspirin EC 325 MG tablet Take 1 tablet (325 mg total) by mouth daily. (Patient not taking: Reported on 05/07/2018) 30 tablet 0   . azelastine (ASTELIN) 0.1 % nasal spray Place into the nose.     Marland Kitchen CARTIA XT 120 MG 24 hr capsule Take 1 capsule by mouth daily. (Patient not taking: Reported on 04/10/2020)  11 Not Taking at Unknown time  . Cholecalciferol (D 2000) 2000 UNITS TABS Take 2,000 Units by mouth daily. (Patient not taking: Reported on 04/04/2020)   Completed Course at Unknown time  . docusate sodium (COLACE) 100 MG capsule Take 1 capsule (100 mg total) by mouth 2 (two) times daily. (Patient not taking: Reported on 05/07/2018) 10 capsule 0   . ferrous sulfate 325 (65 FE) MG tablet Take 1 tablet (325 mg total) by mouth 2 (two) times daily with a meal. (Patient not taking: Reported on 05/07/2018) 60 tablet 2   . gabapentin (NEURONTIN) 100 MG  capsule Take by mouth.     . levalbuterol (XOPENEX HFA) 45 MCG/ACT inhaler Inhale into the lungs.     . lovastatin (MEVACOR) 20 MG tablet Take 20 mg by mouth at bedtime. (Patient not taking: Reported on 04/04/2020)   Completed Course at Unknown time  . metoprolol succinate (TOPROL-XL) 25 MG 24 hr tablet Take by mouth.     . oxyCODONE (OXY IR/ROXICODONE) 5 MG immediate release tablet Take 1-2 tablets (5-10 mg total) by mouth every 4 (four) hours as needed for breakthrough pain. (Patient not taking: Reported on 05/07/2018) 60 tablet 0   . senna (SENOKOT) 8.6 MG TABS tablet Take 1 tablet (8.6 mg total) by mouth 2 (two) times daily as needed for mild constipation. (Patient not taking: Reported on 05/07/2018) 120 each 0   . torsemide (DEMADEX) 5 MG tablet Take 1 tablet by mouth daily. (Patient not taking: Reported on 04/04/2020)  1 Completed Course at Unknown time     Allergies  Allergen Reactions  . Atorvastatin Other (See Comments)  . Iodine Other (See Comments)  . Shellfish Allergy Other (See Comments)     Past Medical History:  Diagnosis Date  . Asthma   . Carotid stenosis   . Headache    MIGRAINES  . History of MI (myocardial infarction)   . Hyperglycemia   . Hyperlipidemia   . Hypertension   . Ischemic cardiomyopathy   . Myocardial infarction ([place]) 1962  . Osteoporosis   .  Parathyroid adenoma     Review of systems:  Otherwise negative.    Physical Exam  Gen: Alert, oriented. Appears stated age.  HEENT: [place]/AT. PERRLA. Lungs: CTA, no wheezes. CV: RR nl S1, S2. Abd: soft, benign, no masses. BS+ Ext: No edema. Pulses 2+    Planned procedures: Proceed with EGD. The patient understands the nature of the planned procedure, indications, risks, alternatives and potential complications including but not limited to bleeding, infection, perforation, damage to internal organs and possible oversedation/side effects from anesthesia. The patient agrees and gives consent to proceed.   Please refer to procedure notes for findings, recommendations and patient disposition/instructions.     Kasie Leccese K. Alice Reichert, M.D. Gastroenterology 04/10/2020  10:11 AM

## 2020-04-11 ENCOUNTER — Encounter: Payer: Self-pay | Admitting: Internal Medicine

## 2020-04-11 LAB — SURGICAL PATHOLOGY

## 2020-04-14 DIAGNOSIS — R7303 Prediabetes: Secondary | ICD-10-CM | POA: Diagnosis not present

## 2020-04-14 DIAGNOSIS — I480 Paroxysmal atrial fibrillation: Secondary | ICD-10-CM | POA: Diagnosis not present

## 2020-04-14 DIAGNOSIS — I700 Atherosclerosis of aorta: Secondary | ICD-10-CM | POA: Diagnosis not present

## 2020-04-14 DIAGNOSIS — N1831 Chronic kidney disease, stage 3a: Secondary | ICD-10-CM | POA: Diagnosis not present

## 2020-04-14 DIAGNOSIS — I779 Disorder of arteries and arterioles, unspecified: Secondary | ICD-10-CM | POA: Diagnosis not present

## 2020-04-14 DIAGNOSIS — I10 Essential (primary) hypertension: Secondary | ICD-10-CM | POA: Diagnosis not present

## 2020-04-14 DIAGNOSIS — Z9989 Dependence on other enabling machines and devices: Secondary | ICD-10-CM | POA: Diagnosis not present

## 2020-04-17 DIAGNOSIS — B370 Candidal stomatitis: Secondary | ICD-10-CM | POA: Diagnosis not present

## 2020-06-27 DIAGNOSIS — H353131 Nonexudative age-related macular degeneration, bilateral, early dry stage: Secondary | ICD-10-CM | POA: Diagnosis not present

## 2020-07-06 DIAGNOSIS — I700 Atherosclerosis of aorta: Secondary | ICD-10-CM | POA: Diagnosis not present

## 2020-07-06 DIAGNOSIS — I480 Paroxysmal atrial fibrillation: Secondary | ICD-10-CM | POA: Diagnosis not present

## 2020-07-06 DIAGNOSIS — R7303 Prediabetes: Secondary | ICD-10-CM | POA: Diagnosis not present

## 2020-07-13 DIAGNOSIS — R7303 Prediabetes: Secondary | ICD-10-CM | POA: Diagnosis not present

## 2020-07-13 DIAGNOSIS — I10 Essential (primary) hypertension: Secondary | ICD-10-CM | POA: Diagnosis not present

## 2020-07-13 DIAGNOSIS — N1831 Chronic kidney disease, stage 3a: Secondary | ICD-10-CM | POA: Diagnosis not present

## 2020-07-13 DIAGNOSIS — I700 Atherosclerosis of aorta: Secondary | ICD-10-CM | POA: Diagnosis not present

## 2020-07-13 DIAGNOSIS — I480 Paroxysmal atrial fibrillation: Secondary | ICD-10-CM | POA: Diagnosis not present

## 2020-07-13 DIAGNOSIS — I779 Disorder of arteries and arterioles, unspecified: Secondary | ICD-10-CM | POA: Diagnosis not present

## 2020-07-13 DIAGNOSIS — D692 Other nonthrombocytopenic purpura: Secondary | ICD-10-CM | POA: Diagnosis not present

## 2020-07-13 DIAGNOSIS — Z9989 Dependence on other enabling machines and devices: Secondary | ICD-10-CM | POA: Diagnosis not present

## 2020-08-02 DIAGNOSIS — D485 Neoplasm of uncertain behavior of skin: Secondary | ICD-10-CM | POA: Diagnosis not present

## 2020-08-02 DIAGNOSIS — L820 Inflamed seborrheic keratosis: Secondary | ICD-10-CM | POA: Diagnosis not present

## 2020-08-02 DIAGNOSIS — L918 Other hypertrophic disorders of the skin: Secondary | ICD-10-CM | POA: Diagnosis not present

## 2020-08-24 DIAGNOSIS — Z8719 Personal history of other diseases of the digestive system: Secondary | ICD-10-CM | POA: Diagnosis not present

## 2020-08-24 DIAGNOSIS — K449 Diaphragmatic hernia without obstruction or gangrene: Secondary | ICD-10-CM | POA: Diagnosis not present

## 2020-08-24 DIAGNOSIS — K2289 Other specified disease of esophagus: Secondary | ICD-10-CM | POA: Diagnosis not present

## 2020-08-24 DIAGNOSIS — Z9889 Other specified postprocedural states: Secondary | ICD-10-CM | POA: Diagnosis not present

## 2020-09-10 ENCOUNTER — Emergency Department: Payer: PPO

## 2020-09-10 ENCOUNTER — Other Ambulatory Visit: Payer: Self-pay

## 2020-09-10 ENCOUNTER — Emergency Department
Admission: EM | Admit: 2020-09-10 | Discharge: 2020-09-10 | Disposition: A | Discharge status: Home / Self Care | Payer: PPO | Attending: Emergency Medicine | Admitting: Emergency Medicine

## 2020-09-10 DIAGNOSIS — J45901 Unspecified asthma with (acute) exacerbation: Secondary | ICD-10-CM

## 2020-09-10 DIAGNOSIS — R0603 Acute respiratory distress: Secondary | ICD-10-CM | POA: Insufficient documentation

## 2020-09-10 DIAGNOSIS — J40 Bronchitis, not specified as acute or chronic: Secondary | ICD-10-CM

## 2020-09-10 DIAGNOSIS — N183 Chronic kidney disease, stage 3 unspecified: Secondary | ICD-10-CM | POA: Diagnosis not present

## 2020-09-10 DIAGNOSIS — Z7982 Long term (current) use of aspirin: Secondary | ICD-10-CM | POA: Insufficient documentation

## 2020-09-10 DIAGNOSIS — R059 Cough, unspecified: Secondary | ICD-10-CM | POA: Insufficient documentation

## 2020-09-10 DIAGNOSIS — J45909 Unspecified asthma, uncomplicated: Secondary | ICD-10-CM | POA: Diagnosis not present

## 2020-09-10 DIAGNOSIS — R0689 Other abnormalities of breathing: Secondary | ICD-10-CM | POA: Diagnosis not present

## 2020-09-10 DIAGNOSIS — Z79899 Other long term (current) drug therapy: Secondary | ICD-10-CM | POA: Diagnosis not present

## 2020-09-10 DIAGNOSIS — I447 Left bundle-branch block, unspecified: Secondary | ICD-10-CM | POA: Diagnosis not present

## 2020-09-10 DIAGNOSIS — I499 Cardiac arrhythmia, unspecified: Secondary | ICD-10-CM | POA: Diagnosis not present

## 2020-09-10 DIAGNOSIS — I129 Hypertensive chronic kidney disease with stage 1 through stage 4 chronic kidney disease, or unspecified chronic kidney disease: Secondary | ICD-10-CM | POA: Diagnosis not present

## 2020-09-10 DIAGNOSIS — Z20822 Contact with and (suspected) exposure to covid-19: Secondary | ICD-10-CM | POA: Insufficient documentation

## 2020-09-10 DIAGNOSIS — R0602 Shortness of breath: Secondary | ICD-10-CM | POA: Diagnosis not present

## 2020-09-10 DIAGNOSIS — R0902 Hypoxemia: Secondary | ICD-10-CM | POA: Diagnosis not present

## 2020-09-10 LAB — CBC
HCT: 38 % (ref 36.0–46.0)
Hemoglobin: 12.6 g/dL (ref 12.0–15.0)
MCH: 30.5 pg (ref 26.0–34.0)
MCHC: 33.2 g/dL (ref 30.0–36.0)
MCV: 92 fL (ref 80.0–100.0)
Platelets: 188 10*3/uL (ref 150–400)
RBC: 4.13 MIL/uL (ref 3.87–5.11)
RDW: 12.7 % (ref 11.5–15.5)
WBC: 4.5 10*3/uL (ref 4.0–10.5)
nRBC: 0 % (ref 0.0–0.2)

## 2020-09-10 LAB — BASIC METABOLIC PANEL
Anion gap: 7 (ref 5–15)
BUN: 32 mg/dL — ABNORMAL HIGH (ref 8–23)
CO2: 29 mmol/L (ref 22–32)
Calcium: 8.6 mg/dL — ABNORMAL LOW (ref 8.9–10.3)
Chloride: 103 mmol/L (ref 98–111)
Creatinine, Ser: 0.97 mg/dL (ref 0.44–1.00)
GFR, Estimated: 54 mL/min — ABNORMAL LOW (ref >60)
Glucose, Bld: 132 mg/dL — ABNORMAL HIGH (ref 70–99)
Potassium: 4.3 mmol/L (ref 3.5–5.1)
Sodium: 139 mmol/L (ref 135–145)

## 2020-09-10 LAB — RESP PANEL BY RT-PCR (FLU A&B, COVID) ARPGX2
Influenza A by PCR: NEGATIVE
Influenza B by PCR: NEGATIVE
SARS Coronavirus 2 by RT PCR: NEGATIVE

## 2020-09-10 LAB — PROTIME-INR
INR: 1 (ref 0.8–1.2)
Prothrombin Time: 13.3 seconds (ref 11.4–15.2)

## 2020-09-10 LAB — BRAIN NATRIURETIC PEPTIDE: B Natriuretic Peptide: 472.8 pg/mL — ABNORMAL HIGH (ref 0.0–100.0)

## 2020-09-10 LAB — TROPONIN I (HIGH SENSITIVITY)
Troponin I (High Sensitivity): 15 ng/L (ref <18)
Troponin I (High Sensitivity): 19 ng/L — ABNORMAL HIGH (ref <18)

## 2020-09-10 LAB — PROCALCITONIN: Procalcitonin: <0.1 ng/mL

## 2020-09-10 MED ORDER — METHYLPREDNISOLONE SODIUM SUCC 125 MG IJ SOLR
125.0000 mg | Freq: Once | INTRAMUSCULAR | Status: AC
  Administered 2020-09-10: 125 mg via INTRAVENOUS
  Filled 2020-09-10: qty 2

## 2020-09-10 MED ORDER — ALBUTEROL SULFATE (2.5 MG/3ML) 0.083% IN NEBU
10.0000 mg | INHALATION_SOLUTION | Freq: Once | RESPIRATORY_TRACT | Status: AC
  Administered 2020-09-10: 10 mg via RESPIRATORY_TRACT
  Filled 2020-09-10: qty 12

## 2020-09-10 MED ORDER — MAGNESIUM SULFATE 2 GM/50ML IV SOLN
2.0000 g | Freq: Once | INTRAVENOUS | Status: AC
  Administered 2020-09-10: 2 g via INTRAVENOUS
  Filled 2020-09-10: qty 50

## 2020-09-10 MED ORDER — FUROSEMIDE 10 MG/ML IJ SOLN: 10.0000 mg | Freq: Once | INTRAMUSCULAR | Status: DC

## 2020-09-10 MED ORDER — FUROSEMIDE 10 MG/ML IJ SOLN
20.0000 mg | Freq: Once | INTRAMUSCULAR | Status: AC
  Administered 2020-09-10: 20 mg via INTRAVENOUS
  Filled 2020-09-10: qty 4

## 2020-09-10 MED ORDER — PREDNISONE 20 MG PO TABS: 60.0000 mg | ORAL_TABLET | Freq: Every day | ORAL | 0 refills | Status: AC

## 2020-09-10 MED ORDER — IPRATROPIUM-ALBUTEROL 0.5-2.5 (3) MG/3ML IN SOLN
3.0000 mL | Freq: Once | RESPIRATORY_TRACT | Status: AC
  Administered 2020-09-10: 3 mL via RESPIRATORY_TRACT
  Filled 2020-09-10: qty 3

## 2020-09-10 MED ORDER — ACETAMINOPHEN 500 MG PO TABS
1000.0000 mg | ORAL_TABLET | Freq: Once | ORAL | Status: AC
  Administered 2020-09-10: 1000 mg via ORAL
  Filled 2020-09-10: qty 2

## 2020-09-10 MED ORDER — IPRATROPIUM-ALBUTEROL 0.5-2.5 (3) MG/3ML IN SOLN: 3.0000 mL | RESPIRATORY_TRACT | 0 refills | Status: DC | PRN

## 2020-09-10 NOTE — ED Provider Notes (Signed)
[facility] Emergency Department Provider Note  ____________________________________________  Time seen: Approximately 5:15 AM  I have reviewed the triage vital signs and the nursing notes.   HISTORY  Chief Complaint Shortness of Breath   HPI Anna Barrett is a 87 y.o. female with a history of asthma, CAD, ischemic cardiomyopathy, aspiration, PUD, HTN, HLD who presents for evaluation of shortness of breath.  Patient reports 1 week of progressively worsening cough and shortness of breath.   She is not sure if the cough is productive or not.  She denies any fever.  She denies any chest pain.  She has had wheezing.  She reports that this morning she felt some tightness in her chest while coughing.  She reports that she took 1 nitro and the tightness went away.  She denies vomiting or diarrhea, history of PE or DVT, recent travel immobilization, leg pain or swelling, hemoptysis, or exogenous hormones.  Past Medical History:  Diagnosis Date  . Asthma   . Carotid stenosis   . Headache    MIGRAINES  . History of MI (myocardial infarction)   . Hyperglycemia   . Hyperlipidemia   . Hypertension   . Ischemic cardiomyopathy   . Myocardial infarction ([place]) 1962  . Osteoporosis   . Parathyroid adenoma     Patient Active Problem List   Diagnosis Date Noted  . Asthma without status asthmaticus 06/03/2017  . Carotid artery disease ([place]) 06/03/2017  . Hypercholesterolemia with hypertriglyceridemia 06/03/2017  . Hyperglycemia, unspecified 06/03/2017  . Hypertension 06/03/2017  . Ischemic cardiomyopathy 06/03/2017  . Parathyroid adenoma 06/03/2017  . Aortic atherosclerosis ([place]) 12/24/2016  . Closed left ankle fracture 04/16/2015  . Senile purpura ([place]) 01/10/2015  . CKD (chronic kidney disease) stage 3, GFR 30-59 ml/min (HCC) 09/28/2013  . A-fib ([place]) 09/24/2013    Past Surgical History:  Procedure Laterality Date  . ABDOMINAL HYSTERECTOMY    . APPENDECTOMY     . CATARACT EXTRACTION Bilateral   . ESOPHAGOGASTRODUODENOSCOPY (EGD) WITH PROPOFOL N/A 04/10/2020   Procedure: ESOPHAGOGASTRODUODENOSCOPY (EGD) WITH PROPOFOL;  Surgeon: Toledo, Benay Pike, MD;  Location: [facility] ENDOSCOPY;  Service: Gastroenterology;  Laterality: N/A;  . EYE SURGERY    . FRACTURE SURGERY    . ORIF ANKLE FRACTURE Left 04/17/2015   Procedure: OPEN REDUCTION INTERNAL FIXATION (ORIF) ANKLE FRACTURE;  Surgeon: Corky Mull, MD;  Location: [facility] ORS;  Service: Orthopedics;  Laterality: Left;    Prior to Admission medications   Medication Sig Start Date End Date Taking? Authorizing Provider  albuterol (PROVENTIL HFA;VENTOLIN HFA) 108 (90 BASE) MCG/ACT inhaler Inhale 1-2 puffs into the lungs every 6 (six) hours as needed for wheezing or shortness of breath.    [provider]  aspirin EC 325 MG tablet Take 1 tablet (325 mg total) by mouth daily. Patient not taking: Reported on 05/07/2018 04/19/15   Gladstone Lighter, MD  azelastine (ASTELIN) 0.1 % nasal spray Place into the nose. 06/18/16 06/18/17  [provider]  CARTIA XT 120 MG 24 hr capsule Take 1 capsule by mouth daily. Patient not taking: Reported on 04/10/2020 04/05/15   [provider]  Cholecalciferol (D 2000) 2000 UNITS TABS Take 2,000 Units by mouth daily. Patient not taking: Reported on 04/04/2020    [provider]  cloNIDine (CATAPRES - DOSED IN MG/24 HR) 0.1 mg/24hr patch Place 0.1 mg onto the skin once a week.    [provider]  docusate sodium (COLACE) 100 MG capsule Take 1 capsule (100  mg total) by mouth 2 (two) times daily. Patient not taking: Reported on 05/07/2018 04/19/15   Gladstone Lighter, MD  ferrous sulfate 325 (65 FE) MG tablet Take 1 tablet (325 mg total) by mouth 2 (two) times daily with a meal. Patient not taking: Reported on 05/07/2018 04/19/15   Gladstone Lighter, MD  gabapentin (NEURONTIN) 100 MG capsule Take by mouth. 12/23/16 12/23/17  [provider]   levalbuterol Penne Lash HFA) 45 MCG/ACT inhaler Inhale into the lungs. 03/01/18 03/01/19  [provider]  lovastatin (MEVACOR) 20 MG tablet Take 20 mg by mouth at bedtime. Patient not taking: Reported on 04/04/2020    [provider]  metoprolol succinate (TOPROL-XL) 25 MG 24 hr tablet Take by mouth. 02/11/17 02/11/18  [provider]  oxyCODONE (OXY IR/ROXICODONE) 5 MG immediate release tablet Take 1-2 tablets (5-10 mg total) by mouth every 4 (four) hours as needed for breakthrough pain. Patient not taking: Reported on 05/07/2018 04/19/15   Lattie Corns, PA-C  senna (SENOKOT) 8.6 MG TABS tablet Take 1 tablet (8.6 mg total) by mouth 2 (two) times daily as needed for mild constipation. Patient not taking: Reported on 05/07/2018 04/19/15   Gladstone Lighter, MD  torsemide (DEMADEX) 5 MG tablet Take 1 tablet by mouth daily. Patient not taking: Reported on 04/04/2020 03/16/15   [provider]    Allergies Atorvastatin, Iodine, and Shellfish allergy  Family History  Problem Relation Age of Onset  . Heart disease Mother   . Heart disease Father     Social History Social History   Tobacco Use  . Smoking status: Never Smoker  . Smokeless tobacco: Never Used  Vaping Use  . Vaping Use: Never used  Substance Use Topics  . Alcohol use: No  . Drug use: Never    Review of Systems  Constitutional: Negative for fever. Eyes: Negative for visual changes. ENT: Negative for sore throat. Neck: No neck pain  Cardiovascular: Negative for chest pain. Respiratory: + shortness of breath and cough Gastrointestinal: Negative for abdominal pain, vomiting or diarrhea. Genitourinary: Negative for dysuria. Musculoskeletal: Negative for back pain. Skin: Negative for rash. Neurological: Negative for headaches, weakness or numbness. Psych: No SI or HI  ____________________________________________   PHYSICAL EXAM:  VITAL SIGNS: ED Triage Vitals  Enc Vitals  Group     BP 09/10/20 0423 (!) 167/94     Pulse Rate 09/10/20 0423 87     Resp 09/10/20 0423 16     Temp 09/10/20 0423 98 F (36.7 C)     Temp Source 09/10/20 0423 Oral     SpO2 09/10/20 0418 94 %     Weight 09/10/20 0421 89 lb 15.2 oz (40.8 kg)     Height 09/10/20 0421 5\' 1"  (1.549 m)     Head Circumference --      Peak Flow --      Pain Score 09/10/20 0421 0     Pain Loc --      Pain Edu? --      Excl. in [place]? --     Constitutional: Alert and oriented.  Mild respiratory distress. HEENT:      Head: Normocephalic and atraumatic.         Eyes: Conjunctivae are normal. Sclera is non-icteric.       Mouth/Throat: Mucous membranes are moist.       Neck: Supple with no signs of meningismus. Cardiovascular: Regular rate and rhythm. No murmurs, gallops, or rubs. 2+ symmetrical distal pulses are present in  all extremities. No JVD. Respiratory: Increased work of breathing, tachypnea, satting 100% on room air with coarse rhonchi bilateral Gastrointestinal: Soft, non tender, and non distended. Musculoskeletal:  No edema, cyanosis, or erythema of extremities. Neurologic: Normal speech and language. Face is symmetric. Moving all extremities. No gross focal neurologic deficits are appreciated. Skin: Skin is warm, dry and intact. No rash noted. Psychiatric: Mood and affect are normal. Speech and behavior are normal.  ____________________________________________   LABS (all labs ordered are listed, but only abnormal results are displayed)  Labs Reviewed  BASIC METABOLIC PANEL - Abnormal; Notable for the following components:      Result Value   Glucose, Bld 132 (*)    BUN 32 (*)    Calcium 8.6 (*)    GFR, Estimated 54 (*)    All other components within normal limits  BRAIN NATRIURETIC PEPTIDE - Abnormal; Notable for the following components:   B Natriuretic Peptide 472.8 (*)    All other components within normal limits  RESP PANEL BY RT-PCR (FLU A&B, COVID) ARPGX2  CBC  PROTIME-INR   PROCALCITONIN  TROPONIN I (HIGH SENSITIVITY)  TROPONIN I (HIGH SENSITIVITY)   ____________________________________________  EKG  ED ECG REPORT I, Rudene Re, the attending physician, personally viewed and interpreted this ECG.  Sinus rhythm with left bundle branch block, no concordant ST elevation, rate of 84, normal QTC.  No significant changes when compared to prior from 2016. ____________________________________________  RADIOLOGY  I have personally reviewed the images performed during this visit and I agree with the Radiologist's read.   Interpretation by Radiologist:  DG Chest Portable 1 View  Result Date: 09/10/2020 CLINICAL DATA:  Chest, shortness of breath EXAM: PORTABLE CHEST 1 VIEW COMPARISON:  Esophagram 03/02/2020, radiograph 04/17/2015 FINDINGS: Diffuse airways thickening and peribronchial opacity with bronchiectatic changes throughout both lungs which were present though to a significantly lesser extent on comparison priors. No pneumothorax or visible effusion. Chronic hyperinflation with biapical pleuroparenchymal scarring. Stable cardiomediastinal contours with a calcified, tortuous aorta. No acute osseous or soft tissue abnormality. Degenerative changes are present in the imaged spine and shoulders. Dextrocurvature of the thoracic spine. Telemetry leads overlie the chest. IMPRESSION: Diffusely coarsened interstitial opacities with bronchiectatic and bronchitic features. Differential is broad though may suggest sequela of acute on chronic chronic aspiration given the presence of chronic dysphagia and demonstrable aspiration on prior esophagram. Electronically Signed   By: Lovena Le M.D.   On: 09/10/2020 05:07     ____________________________________________   PROCEDURES  Procedure(s) performed:yes .1-3 Lead EKG Interpretation Performed by: Rudene Re, MD Authorized by: Rudene Re, MD     Interpretation: abnormal     ECG rate assessment:  tachycardic     Rhythm: sinus rhythm     Ectopy: none     Conduction: abnormal     Critical Care performed:  None ____________________________________________   INITIAL IMPRESSION / ASSESSMENT AND PLAN / ED COURSE  87 y.o. female with a history of asthma, CAD, ischemic cardiomyopathy, aspiration, PUD, HTN, HLD who presents for evaluation of shortness of breath x 1 week.  Patient in mild respiratory distress with tachypnea and coarse rhonchi bilaterally but no hypoxia.  Looks euvolemic.  Ddx bronchitis versus pneumonia versus COVID versus flu versus pulmonary edema versus aspiration pneumonia versus asthma exacerbation versus PE  Plan for labs, imaging, EKG, telemetry monitoring.  We will give 3 duo nebs and Solu-Medrol.  We will get a viral swab.  Old medical records reviewed including patient's barium swallow done in  2021 showing evidence of laryngeal aspiration and also her last echo from 2016.  _________________________ 6:12 AM on 09/10/2020 -----------------------------------------  After 3 DuoNeb's and Solu-Medrol patient is doing markedly improved, with improved work of breathing but sats are now in the low 90s.  She does have some wheezing so we will put her on 10 mg albuterol for 1 hour and reassess since patient is pretty adamant about wanting to go home.  Her COVID and flu were negative.  Her chest x-ray is limited due to bronchiectatic changes.  With no fever and no leukocytosis we will hold off on antibiotics unless procalcitonin is elevated.  Her son is now at bedside.  Patient has been taken off of diuretics for a long time.  Her BNP is slightly elevated at 472 so will give lasix IV.   _________________________ 7:08 AM on 09/10/2020 ----------------------------------------- Patient finishing 1 hour of albuterol.  Plan to reassess.  Care transferred to Dr. Hulan Saas     _____________________________________________ Please note:  Patient was evaluated in Emergency  Department today for the symptoms described in the history of present illness. Patient was evaluated in the context of the global COVID-19 pandemic, which necessitated consideration that the patient might be at risk for infection with the SARS-CoV-2 virus that causes COVID-19. Institutional protocols and algorithms that pertain to the evaluation of patients at risk for COVID-19 are in a state of rapid change based on information released by regulatory bodies including the CDC and federal and state organizations. These policies and algorithms were followed during the patient's care in the ED.  Some ED evaluations and interventions may be delayed as a result of limited staffing during the pandemic.   [place] Controlled Substance Database was reviewed by me. ____________________________________________   FINAL CLINICAL IMPRESSION(S) / ED DIAGNOSES  SOB    NEW MEDICATIONS STARTED DURING THIS VISIT:  ED Discharge Orders    None       Note:  This document was prepared using Dragon voice recognition software and may include unintentional dictation errors.    Alfred Levins, [place], MD 09/10/20 [phone]

## 2020-09-10 NOTE — ED Notes (Signed)
Pure wick placed.

## 2020-09-10 NOTE — ED Notes (Signed)
Patient denies pain.

## 2020-09-10 NOTE — ED Provider Notes (Signed)
I seen care of this patient approximately 0 700.  Please see of providers note for full details) evaluation assessment.  In brief patient presents with cough and shortness of breath x1 week without any fevers and some chest tightness last night.  She does have a history of asthma.  No history of heart failure.  She was given DuoNeb Solu-Medrol and Lasix she has an elevated BNP.  Low suspicion for pneumonia given absence of fever or leukocytosis and undetectable Pro-Cal.  Plan is to give continuous DuoNeb treatment and reassess at 7:20 AM.  Patient is able to ambulate and not hypoxic she is likely safe for discharge home with outpatient follow-up.  If she is hypoxic plan is to admit.  On reassessment patient is not hypoxic but still little tachypneic and slightly tachycardic.  She states she feels much better.  Had extensive discussion with patient and son at bedside regarding admission for observation and continued treatments given persistent tachycardia tachypnea and elevated blood pressure however she states she feels much better and wishes to go home.  Do think this is reasonable I suspect her elevated blood pressure heart rate likely secondary to her albuterol treatment.  Her troponin is stable at 19.15/2 hours.  Discharged in stable condition.  Strict return cautions provided and discussed.    Lucrezia Starch, MD 09/10/20 [phone]

## 2020-09-10 NOTE — ED Triage Notes (Addendum)
Pt from home via ems, c/o progressing cough and shob x1wk, denies fevers, swelling or urinary symptoms. Reports took 1ntg/breathing treatment for minor cp with relief

## 2020-11-01 DIAGNOSIS — L578 Other skin changes due to chronic exposure to nonionizing radiation: Secondary | ICD-10-CM | POA: Diagnosis not present

## 2020-11-01 DIAGNOSIS — L821 Other seborrheic keratosis: Secondary | ICD-10-CM | POA: Diagnosis not present

## 2020-11-01 DIAGNOSIS — L570 Actinic keratosis: Secondary | ICD-10-CM | POA: Diagnosis not present

## 2020-11-01 DIAGNOSIS — C44329 Squamous cell carcinoma of skin of other parts of face: Secondary | ICD-10-CM | POA: Diagnosis not present

## 2020-11-01 DIAGNOSIS — D485 Neoplasm of uncertain behavior of skin: Secondary | ICD-10-CM | POA: Diagnosis not present

## 2020-11-24 DIAGNOSIS — C44329 Squamous cell carcinoma of skin of other parts of face: Secondary | ICD-10-CM | POA: Diagnosis not present

## 2020-12-08 DIAGNOSIS — D0439 Carcinoma in situ of skin of other parts of face: Secondary | ICD-10-CM | POA: Diagnosis not present

## 2020-12-08 DIAGNOSIS — D485 Neoplasm of uncertain behavior of skin: Secondary | ICD-10-CM | POA: Diagnosis not present

## 2020-12-12 DIAGNOSIS — R197 Diarrhea, unspecified: Secondary | ICD-10-CM | POA: Diagnosis not present

## 2020-12-26 DIAGNOSIS — J4521 Mild intermittent asthma with (acute) exacerbation: Secondary | ICD-10-CM | POA: Diagnosis not present

## 2020-12-26 DIAGNOSIS — J069 Acute upper respiratory infection, unspecified: Secondary | ICD-10-CM | POA: Diagnosis not present

## 2020-12-26 DIAGNOSIS — J449 Chronic obstructive pulmonary disease, unspecified: Secondary | ICD-10-CM | POA: Diagnosis not present

## 2020-12-28 DIAGNOSIS — D044 Carcinoma in situ of skin of scalp and neck: Secondary | ICD-10-CM | POA: Diagnosis not present

## 2021-01-11 DIAGNOSIS — D485 Neoplasm of uncertain behavior of skin: Secondary | ICD-10-CM | POA: Diagnosis not present

## 2021-01-11 DIAGNOSIS — D0461 Carcinoma in situ of skin of right upper limb, including shoulder: Secondary | ICD-10-CM | POA: Diagnosis not present

## 2021-02-19 DIAGNOSIS — R7303 Prediabetes: Secondary | ICD-10-CM | POA: Diagnosis not present

## 2021-02-19 DIAGNOSIS — N1831 Chronic kidney disease, stage 3a: Secondary | ICD-10-CM | POA: Diagnosis not present

## 2021-02-19 DIAGNOSIS — Z13220 Encounter for screening for lipoid disorders: Secondary | ICD-10-CM | POA: Diagnosis not present

## 2021-02-19 DIAGNOSIS — I10 Essential (primary) hypertension: Secondary | ICD-10-CM | POA: Diagnosis not present

## 2021-02-21 DIAGNOSIS — L821 Other seborrheic keratosis: Secondary | ICD-10-CM | POA: Diagnosis not present

## 2021-02-21 DIAGNOSIS — D0461 Carcinoma in situ of skin of right upper limb, including shoulder: Secondary | ICD-10-CM | POA: Diagnosis not present

## 2021-02-22 DIAGNOSIS — T466X5A Adverse effect of antihyperlipidemic and antiarteriosclerotic drugs, initial encounter: Secondary | ICD-10-CM | POA: Diagnosis not present

## 2021-02-22 DIAGNOSIS — I480 Paroxysmal atrial fibrillation: Secondary | ICD-10-CM | POA: Diagnosis not present

## 2021-02-22 DIAGNOSIS — Z0000 Encounter for general adult medical examination without abnormal findings: Secondary | ICD-10-CM | POA: Diagnosis not present

## 2021-02-22 DIAGNOSIS — I779 Disorder of arteries and arterioles, unspecified: Secondary | ICD-10-CM | POA: Diagnosis not present

## 2021-02-22 DIAGNOSIS — D692 Other nonthrombocytopenic purpura: Secondary | ICD-10-CM | POA: Diagnosis not present

## 2021-02-22 DIAGNOSIS — M791 Myalgia, unspecified site: Secondary | ICD-10-CM | POA: Diagnosis not present

## 2021-02-22 DIAGNOSIS — N1831 Chronic kidney disease, stage 3a: Secondary | ICD-10-CM | POA: Diagnosis not present

## 2021-02-22 DIAGNOSIS — I700 Atherosclerosis of aorta: Secondary | ICD-10-CM | POA: Diagnosis not present

## 2021-02-22 DIAGNOSIS — R7303 Prediabetes: Secondary | ICD-10-CM | POA: Diagnosis not present

## 2021-06-02 ENCOUNTER — Other Ambulatory Visit: Payer: Self-pay

## 2021-06-02 ENCOUNTER — Encounter: Payer: Self-pay | Admitting: Emergency Medicine

## 2021-06-02 ENCOUNTER — Emergency Department
Admission: EM | Admit: 2021-06-02 | Discharge: 2021-06-03 | Disposition: A | Discharge status: Home / Self Care | Payer: PPO | Attending: Emergency Medicine | Admitting: Emergency Medicine

## 2021-06-02 ENCOUNTER — Emergency Department: Payer: PPO

## 2021-06-02 DIAGNOSIS — J690 Pneumonitis due to inhalation of food and vomit: Secondary | ICD-10-CM | POA: Diagnosis not present

## 2021-06-02 DIAGNOSIS — J698 Pneumonitis due to inhalation of other solids and liquids: Secondary | ICD-10-CM | POA: Diagnosis not present

## 2021-06-02 DIAGNOSIS — R0602 Shortness of breath: Secondary | ICD-10-CM | POA: Diagnosis not present

## 2021-06-02 DIAGNOSIS — J9801 Acute bronchospasm: Secondary | ICD-10-CM | POA: Insufficient documentation

## 2021-06-02 DIAGNOSIS — T6591XA Toxic effect of unspecified substance, accidental (unintentional), initial encounter: Secondary | ICD-10-CM | POA: Insufficient documentation

## 2021-06-02 DIAGNOSIS — I517 Cardiomegaly: Secondary | ICD-10-CM | POA: Diagnosis not present

## 2021-06-02 LAB — CBC WITH DIFFERENTIAL/PLATELET
Abs Immature Granulocytes: 0.03 10*3/uL (ref 0.00–0.07)
Basophils Absolute: 0.1 10*3/uL (ref 0.0–0.1)
Basophils Relative: 1 %
Eosinophils Absolute: 0.3 10*3/uL (ref 0.0–0.5)
Eosinophils Relative: 3 %
HCT: 40.5 % (ref 36.0–46.0)
Hemoglobin: 13.3 g/dL (ref 12.0–15.0)
Immature Granulocytes: 0 %
Lymphocytes Relative: 12 %
Lymphs Abs: 1.1 10*3/uL (ref 0.7–4.0)
MCH: 30 pg (ref 26.0–34.0)
MCHC: 32.8 g/dL (ref 30.0–36.0)
MCV: 91.2 fL (ref 80.0–100.0)
Monocytes Absolute: 0.5 10*3/uL (ref 0.1–1.0)
Monocytes Relative: 6 %
Neutro Abs: 7.2 10*3/uL (ref 1.7–7.7)
Neutrophils Relative %: 78 %
Platelets: 222 10*3/uL (ref 150–400)
RBC: 4.44 MIL/uL (ref 3.87–5.11)
RDW: 13 % (ref 11.5–15.5)
WBC: 9.1 10*3/uL (ref 4.0–10.5)
nRBC: 0 % (ref 0.0–0.2)

## 2021-06-02 LAB — COMPREHENSIVE METABOLIC PANEL
ALT: 14 U/L (ref 0–44)
AST: 13 U/L — ABNORMAL LOW (ref 15–41)
Albumin: 3.7 g/dL (ref 3.5–5.0)
Alkaline Phosphatase: 62 U/L (ref 38–126)
Anion gap: 9 (ref 5–15)
BUN: 49 mg/dL — ABNORMAL HIGH (ref 8–23)
CO2: 30 mmol/L (ref 22–32)
Calcium: 9.1 mg/dL (ref 8.9–10.3)
Chloride: 100 mmol/L (ref 98–111)
Creatinine, Ser: 1.12 mg/dL — ABNORMAL HIGH (ref 0.44–1.00)
GFR, Estimated: 45 mL/min — ABNORMAL LOW (ref >60)
Glucose, Bld: 158 mg/dL — ABNORMAL HIGH (ref 70–99)
Potassium: 4.3 mmol/L (ref 3.5–5.1)
Sodium: 139 mmol/L (ref 135–145)
Total Bilirubin: 0.7 mg/dL (ref 0.3–1.2)
Total Protein: 7.6 g/dL (ref 6.5–8.1)

## 2021-06-02 LAB — PROCALCITONIN: Procalcitonin: 0.1 ng/mL

## 2021-06-02 LAB — LACTIC ACID, PLASMA: Lactic Acid, Venous: 0.9 mmol/L (ref 0.5–1.9)

## 2021-06-02 MED ORDER — METHYLPREDNISOLONE SODIUM SUCC 125 MG IJ SOLR
80.0000 mg | Freq: Once | INTRAMUSCULAR | Status: AC
  Administered 2021-06-02: 80 mg via INTRAVENOUS
  Filled 2021-06-02: qty 2

## 2021-06-02 MED ORDER — RACEPINEPHRINE HCL 2.25 % IN NEBU
0.2500 mL | INHALATION_SOLUTION | Freq: Once | RESPIRATORY_TRACT | Status: AC
Start: 2021-06-02 — End: 2021-06-02
  Administered 2021-06-02: 0.25 mL via RESPIRATORY_TRACT
  Filled 2021-06-02: qty 0.5

## 2021-06-02 MED ORDER — IPRATROPIUM-ALBUTEROL 0.5-2.5 (3) MG/3ML IN SOLN
3.0000 mL | Freq: Once | RESPIRATORY_TRACT | Status: AC
  Administered 2021-06-02: 3 mL via RESPIRATORY_TRACT
  Filled 2021-06-02: qty 3

## 2021-06-02 NOTE — ED Provider Notes (Signed)
[facility] Provider Note    Event Date/Time   First MD Initiated Contact with Patient 06/02/21 2154     (approximate)   History   Shortness of Breath   HPI  Anna Barrett is a 88 y.o. female here with shortness of breath.  The patient states that throughout the day today, she has had progressive worsening, increasingly severe shortness of breath.  She has a history of aspiration as well as bronchitis.  She tried using breathing treatment earlier today and her symptoms seem to get worse.  She feels like she has something stuck in her throat and that she cannot clear it.  She said frequent coughing and wheezing.  She is having difficulty swallowing.  This all began fairly acutely but has worsened throughout the day today.  She has a history of aspiration as well as previous esophageal stretching.  Denies known fevers.     Physical Exam   Triage Vital Signs: ED Triage Vitals  Enc Vitals Group     BP 06/02/21 2113 (!) 188/120     Pulse Rate 06/02/21 2113 84     Resp 06/02/21 2113 (!) 38     Temp 06/02/21 2113 98.7 F (37.1 C)     Temp Source 06/02/21 2113 Oral     SpO2 06/02/21 2112 96 %     Weight 06/02/21 2114 89 lb (40.4 kg)     Height 06/02/21 2114 5\' 1"  (1.549 m)     Head Circumference --      Peak Flow --      Pain Score 06/02/21 2113 0     Pain Loc --      Pain Edu? --      Excl. in [place]? --     Most recent vital signs: Vitals:   06/02/21 2330 06/03/21 0049  BP: (!) 166/58 (!) 150/65  Pulse: 90 92  Resp: (!) 23 20  Temp:    SpO2: 100% 99%     General: Awake, sitting upright with frequent coughing and appears in mild respiratory distress. CV:  Good peripheral perfusion.  No murmurs or rubs. Resp:  Increased work of breathing with tachypnea, diminished aeration, and upper airway stridor. Abd:  No distention.  Other:  Neck supple but with stridor and hoarse breath.  No carotid bruits.  No tracheal tenderness with manipulation.  No  crepitance.   ED Results / Procedures / Treatments   Labs (all labs ordered are listed, but only abnormal results are displayed) Labs Reviewed  COMPREHENSIVE METABOLIC PANEL - Abnormal; Notable for the following components:      Result Value   Glucose, Bld 158 (*)    BUN 49 (*)    Creatinine, Ser 1.12 (*)    AST 13 (*)    GFR, Estimated 45 (*)    All other components within normal limits  CBC WITH DIFFERENTIAL/PLATELET  PROCALCITONIN  LACTIC ACID, PLASMA  LACTIC ACID, PLASMA     EKG Sinus tachycardia, ventricular rate 103.  PR 136, QRS 126, QTc 500.  LVH, seen previously.  No ST elevations.   RADIOLOGY Chest x-ray: No acute disease   I also independently reviewed and agree wit radiologist interpretations.   PROCEDURES:  Critical Care performed: No  Procedures    MEDICATIONS ORDERED IN ED: Medications  Racepinephrine HCl 2.25 % nebulizer solution 0.25 mL (0.25 mLs Nebulization Given 06/02/21 2210)  ipratropium-albuterol (DUONEB) 0.5-2.5 (3) MG/3ML nebulizer solution 3 mL (3 mLs Nebulization Given 06/02/21 2210)  methylPREDNISolone sodium succinate (SOLU-MEDROL) 125 mg/2 mL injection 80 mg (80 mg Intravenous Given 06/02/21 2208)     IMPRESSION / MDM / ASSESSMENT AND PLAN / ED COURSE  I reviewed the triage vital signs and the nursing notes.                               The patient is on the cardiac monitor to evaluate for evidence of arrhythmia and/or significant heart rate changes.   Ddx:  CXR: No acute disease   MDM:  88 yo M with PMHx as above here with cough, SOB.  Suspect possible aspiration versus acute asthma/COPD exacerbation, though the acuity of symptoms and upper airway stridor is more consistent with possible aspiration.  She is adamant she was not eating at the time.  Denies any possible foreign body.  Symptoms did seem worse after receiving a breathing treatment which would suggest a component of bronchospasm.  Patient given racemic epinephrine  here and a DuoNeb which had dramatic resolution and improvement in her symptoms.  Otherwise, chest x-ray is clear.  She is tolerating p.o. with no signs of ongoing upper airway obstruction.  Lab work reviewed and is very reassuring with no leukocytosis or signs of sepsis.  She has a slight dehydration on her BUN to creatinine ratio but is now tolerating p.o.  Chest x-ray clear.  Procalcitonin unremarkable.  Lactic acid negative.  Current plan is to monitor for several hours after racemic epi.  If she remains significantly improved, would recommend steroids, scheduled nebs at home, and will treat for possible aspiration.   MEDICATIONS GIVEN IN ED: Medications  Racepinephrine HCl 2.25 % nebulizer solution 0.25 mL (0.25 mLs Nebulization Given 06/02/21 2210)  ipratropium-albuterol (DUONEB) 0.5-2.5 (3) MG/3ML nebulizer solution 3 mL (3 mLs Nebulization Given 06/02/21 2210)  methylPREDNISolone sodium succinate (SOLU-MEDROL) 125 mg/2 mL injection 80 mg (80 mg Intravenous Given 06/02/21 2208)     Consults:  None   EMR reviewed  PCP visit 02/2021 with Dr. Ouida Sills, noting HTN, pAFib, CKD     FINAL CLINICAL IMPRESSION(S) / ED DIAGNOSES   Final diagnoses:  Bronchospasm  Aspiration pneumonia, unspecified aspiration pneumonia type, unspecified laterality, unspecified part of lung ([place])     Rx / DC Orders   ED Discharge Orders          Ordered    predniSONE (DELTASONE) 20 MG tablet  Daily        06/03/21 0014    amoxicillin-clavulanate (AUGMENTIN) 875-125 MG tablet  2 times daily        06/03/21 0014    albuterol (VENTOLIN HFA) 108 (90 Base) MCG/ACT inhaler  Every 6 hours PRN        06/03/21 0014             Note:  This document was prepared using Dragon voice recognition software and may include unintentional dictation errors.   Duffy Bruce, MD 06/03/21 [phone]

## 2021-06-02 NOTE — ED Triage Notes (Signed)
Pt coughing and c/o shortness of breath, reports sputum in throat and unable to cough it up, s/s worsened today, pt reports she 2 neb treatments pta, pt work of breathing noted

## 2021-06-03 MED ORDER — ALBUTEROL SULFATE HFA 108 (90 BASE) MCG/ACT IN AERS: 2.0000 | INHALATION_SPRAY | Freq: Four times a day (QID) | RESPIRATORY_TRACT | 2 refills | Status: DC | PRN

## 2021-06-03 MED ORDER — PREDNISONE 20 MG PO TABS: 40.0000 mg | ORAL_TABLET | Freq: Every day | ORAL | 0 refills | Status: DC

## 2021-06-03 MED ORDER — HYDROCOD POLI-CHLORPHE POLI ER 10-8 MG/5ML PO SUER
2.5000 mL | Freq: Once | ORAL | Status: AC
Start: 2021-06-03 — End: 2021-06-03
  Administered 2021-06-03: 2.5 mL via ORAL
  Filled 2021-06-03: qty 5

## 2021-06-03 MED ORDER — HYDROCOD POLI-CHLORPHE POLI ER 10-8 MG/5ML PO SUER: 2.5000 mL | Freq: Two times a day (BID) | ORAL | 0 refills | Status: DC | PRN

## 2021-06-03 MED ORDER — AMOXICILLIN-POT CLAVULANATE 875-125 MG PO TABS: 1.0000 | ORAL_TABLET | Freq: Two times a day (BID) | ORAL | 0 refills | Status: AC

## 2021-06-03 NOTE — ED Provider Notes (Signed)
----------------------------------------- °  2:08 AM on 06/03/2021 -----------------------------------------   Patient feeling much better overall.  There is no stridor.  Room air saturation 98%.  Patient and son still note coughing; will administer half dose Tussionex and provide prescription.  Will discharge home per previous providers plan with prescriptions already sent to patient's pharmacy.  Strict return precautions given.  Patient and son verbalized understanding and agree with plan of care.   Paulette Blanch, MD 06/03/21 [phone]

## 2021-06-03 NOTE — Discharge Instructions (Addendum)
I'd recommend:  Take the prednisone and antibiotic as prescribed.  You may take cough syrup as needed.  Use albuterol breathing treatments every 4-6 hours for the next 2 days, then as needed  Try to sit upright during and for at least 20 min after eating  Follow-up with your primary care doctor this week

## 2021-06-05 ENCOUNTER — Encounter: Payer: Self-pay | Admitting: Emergency Medicine

## 2021-06-05 ENCOUNTER — Other Ambulatory Visit: Payer: Self-pay

## 2021-06-05 ENCOUNTER — Emergency Department: Payer: PPO

## 2021-06-05 ENCOUNTER — Observation Stay
Admission: EM | Admit: 2021-06-05 | Discharge: 2021-06-06 | Disposition: A | Discharge status: Home / Self Care | Payer: PPO | Attending: Internal Medicine | Admitting: Internal Medicine

## 2021-06-05 DIAGNOSIS — T18128A Food in esophagus causing other injury, initial encounter: Principal | ICD-10-CM | POA: Insufficient documentation

## 2021-06-05 DIAGNOSIS — I480 Paroxysmal atrial fibrillation: Secondary | ICD-10-CM | POA: Diagnosis not present

## 2021-06-05 DIAGNOSIS — Z79899 Other long term (current) drug therapy: Secondary | ICD-10-CM | POA: Insufficient documentation

## 2021-06-05 DIAGNOSIS — R1110 Vomiting, unspecified: Secondary | ICD-10-CM | POA: Diagnosis not present

## 2021-06-05 DIAGNOSIS — K2289 Other specified disease of esophagus: Secondary | ICD-10-CM | POA: Insufficient documentation

## 2021-06-05 DIAGNOSIS — I10 Essential (primary) hypertension: Secondary | ICD-10-CM | POA: Diagnosis not present

## 2021-06-05 DIAGNOSIS — Z7982 Long term (current) use of aspirin: Secondary | ICD-10-CM | POA: Diagnosis not present

## 2021-06-05 DIAGNOSIS — R0602 Shortness of breath: Secondary | ICD-10-CM | POA: Diagnosis not present

## 2021-06-05 DIAGNOSIS — N183 Chronic kidney disease, stage 3 unspecified: Secondary | ICD-10-CM | POA: Diagnosis present

## 2021-06-05 DIAGNOSIS — R1310 Dysphagia, unspecified: Secondary | ICD-10-CM

## 2021-06-05 DIAGNOSIS — R059 Cough, unspecified: Secondary | ICD-10-CM | POA: Diagnosis not present

## 2021-06-05 DIAGNOSIS — Z20822 Contact with and (suspected) exposure to covid-19: Secondary | ICD-10-CM | POA: Insufficient documentation

## 2021-06-05 DIAGNOSIS — E860 Dehydration: Secondary | ICD-10-CM | POA: Diagnosis not present

## 2021-06-05 DIAGNOSIS — J45909 Unspecified asthma, uncomplicated: Secondary | ICD-10-CM | POA: Diagnosis not present

## 2021-06-05 DIAGNOSIS — I255 Ischemic cardiomyopathy: Secondary | ICD-10-CM | POA: Diagnosis present

## 2021-06-05 DIAGNOSIS — K449 Diaphragmatic hernia without obstruction or gangrene: Secondary | ICD-10-CM | POA: Diagnosis not present

## 2021-06-05 DIAGNOSIS — K222 Esophageal obstruction: Secondary | ICD-10-CM

## 2021-06-05 DIAGNOSIS — I4891 Unspecified atrial fibrillation: Secondary | ICD-10-CM | POA: Diagnosis present

## 2021-06-05 DIAGNOSIS — X58XXXA Exposure to other specified factors, initial encounter: Secondary | ICD-10-CM | POA: Insufficient documentation

## 2021-06-05 DIAGNOSIS — J449 Chronic obstructive pulmonary disease, unspecified: Secondary | ICD-10-CM | POA: Diagnosis not present

## 2021-06-05 LAB — BASIC METABOLIC PANEL
Anion gap: 9 (ref 5–15)
BUN: 49 mg/dL — ABNORMAL HIGH (ref 8–23)
CO2: 29 mmol/L (ref 22–32)
Calcium: 9.1 mg/dL (ref 8.9–10.3)
Chloride: 107 mmol/L (ref 98–111)
Creatinine, Ser: 1.07 mg/dL — ABNORMAL HIGH (ref 0.44–1.00)
GFR, Estimated: 48 mL/min — ABNORMAL LOW (ref >60)
Glucose, Bld: 132 mg/dL — ABNORMAL HIGH (ref 70–99)
Potassium: 3.9 mmol/L (ref 3.5–5.1)
Sodium: 145 mmol/L (ref 135–145)

## 2021-06-05 LAB — CBC
HCT: 39.8 % (ref 36.0–46.0)
Hemoglobin: 13 g/dL (ref 12.0–15.0)
MCH: 30.4 pg (ref 26.0–34.0)
MCHC: 32.7 g/dL (ref 30.0–36.0)
MCV: 93.2 fL (ref 80.0–100.0)
Platelets: 214 10*3/uL (ref 150–400)
RBC: 4.27 MIL/uL (ref 3.87–5.11)
RDW: 13.2 % (ref 11.5–15.5)
WBC: 8.3 10*3/uL (ref 4.0–10.5)
nRBC: 0 % (ref 0.0–0.2)

## 2021-06-05 LAB — RESP PANEL BY RT-PCR (FLU A&B, COVID) ARPGX2
Influenza A by PCR: NEGATIVE
Influenza B by PCR: NEGATIVE
SARS Coronavirus 2 by RT PCR: NEGATIVE

## 2021-06-05 LAB — TROPONIN I (HIGH SENSITIVITY)
Troponin I (High Sensitivity): 17 ng/L (ref <18)
Troponin I (High Sensitivity): 17 ng/L (ref <18)

## 2021-06-05 MED ORDER — ENOXAPARIN SODIUM 40 MG/0.4ML IJ SOSY: 40.0000 mg | PREFILLED_SYRINGE | INTRAMUSCULAR | Status: DC

## 2021-06-05 MED ORDER — SODIUM CHLORIDE 0.9 % IV SOLN
2.0000 g | INTRAVENOUS | Status: DC
  Administered 2021-06-05 – 2021-06-06 (×2): 2 g via INTRAVENOUS
  Filled 2021-06-05 (×2): qty 20
  Filled 2021-06-05: qty 2

## 2021-06-05 MED ORDER — CLONIDINE HCL 0.1 MG/24HR TD PTWK: 0.1000 mg | MEDICATED_PATCH | TRANSDERMAL | Status: DC

## 2021-06-05 MED ORDER — HYDROCOD POLI-CHLORPHE POLI ER 10-8 MG/5ML PO SUER: 2.5000 mL | Freq: Two times a day (BID) | ORAL | Status: DC | PRN

## 2021-06-05 MED ORDER — ONDANSETRON HCL 4 MG/2ML IJ SOLN: 4.0000 mg | Freq: Four times a day (QID) | INTRAMUSCULAR | Status: DC | PRN

## 2021-06-05 MED ORDER — ACETAMINOPHEN 325 MG PO TABS: 650.0000 mg | ORAL_TABLET | Freq: Four times a day (QID) | ORAL | Status: DC | PRN

## 2021-06-05 MED ORDER — LACTATED RINGERS IV BOLUS
1000.0000 mL | Freq: Once | INTRAVENOUS | Status: AC
  Administered 2021-06-05: 14:00:00 1000 mL via INTRAVENOUS

## 2021-06-05 MED ORDER — MAGNESIUM HYDROXIDE 400 MG/5ML PO SUSP: 30.0000 mL | Freq: Every day | ORAL | Status: DC | PRN

## 2021-06-05 MED ORDER — METOPROLOL SUCCINATE ER 25 MG PO TB24: 25.0000 mg | ORAL_TABLET | Freq: Every day | ORAL | Status: DC

## 2021-06-05 MED ORDER — SODIUM CHLORIDE 0.9 % IV SOLN
500.0000 mg | INTRAVENOUS | Status: DC
  Administered 2021-06-05 – 2021-06-06 (×2): 500 mg via INTRAVENOUS
  Filled 2021-06-05: qty 500
  Filled 2021-06-05 (×2): qty 5

## 2021-06-05 MED ORDER — GABAPENTIN 100 MG PO CAPS: 100.0000 mg | ORAL_CAPSULE | Freq: Two times a day (BID) | ORAL | Status: DC

## 2021-06-05 MED ORDER — ONDANSETRON HCL 4 MG PO TABS: 4.0000 mg | ORAL_TABLET | Freq: Four times a day (QID) | ORAL | Status: DC | PRN

## 2021-06-05 MED ORDER — CLONIDINE HCL 0.1 MG/24HR TD PTWK
0.1000 mg | MEDICATED_PATCH | TRANSDERMAL | Status: DC
  Filled 2021-06-05: qty 1

## 2021-06-05 MED ORDER — ENOXAPARIN SODIUM 30 MG/0.3ML IJ SOSY
30.0000 mg | PREFILLED_SYRINGE | INTRAMUSCULAR | Status: DC
  Administered 2021-06-05: 21:00:00 30 mg via SUBCUTANEOUS
  Filled 2021-06-05: qty 0.3

## 2021-06-05 MED ORDER — TRAZODONE HCL 50 MG PO TABS: 25.0000 mg | ORAL_TABLET | Freq: Every evening | ORAL | Status: DC | PRN

## 2021-06-05 MED ORDER — AZELASTINE HCL 0.1 % NA SOLN
2.0000 | Freq: Two times a day (BID) | NASAL | Status: DC
  Filled 2021-06-05: qty 30

## 2021-06-05 MED ORDER — METOPROLOL TARTRATE 5 MG/5ML IV SOLN
2.5000 mg | Freq: Two times a day (BID) | INTRAVENOUS | Status: DC
  Administered 2021-06-05: 21:00:00 2.5 mg via INTRAVENOUS
  Filled 2021-06-05: qty 5

## 2021-06-05 MED ORDER — ACETAMINOPHEN 650 MG RE SUPP: 650.0000 mg | Freq: Four times a day (QID) | RECTAL | Status: DC | PRN

## 2021-06-05 MED ORDER — IPRATROPIUM-ALBUTEROL 0.5-2.5 (3) MG/3ML IN SOLN
3.0000 mL | Freq: Four times a day (QID) | RESPIRATORY_TRACT | Status: DC
  Administered 2021-06-05 – 2021-06-06 (×3): 3 mL via RESPIRATORY_TRACT
  Filled 2021-06-05 (×3): qty 3

## 2021-06-05 MED ORDER — LABETALOL HCL 5 MG/ML IV SOLN
20.0000 mg | INTRAVENOUS | Status: DC | PRN
  Administered 2021-06-05 – 2021-06-06 (×3): 20 mg via INTRAVENOUS
  Filled 2021-06-05 (×4): qty 4

## 2021-06-05 MED ORDER — SODIUM CHLORIDE 0.9 % IV SOLN: INTRAVENOUS | Status: DC

## 2021-06-05 NOTE — ED Notes (Signed)
This RN called 2C to find out what RN will be assigned to this pt so she could give report; no answer.

## 2021-06-05 NOTE — ED Notes (Signed)
Secure msg sent to pharmacy requesting they adjust medication times once all meds are verified.

## 2021-06-05 NOTE — ED Notes (Signed)
Secure msg sent to Candis Musa, RN for ED to IP SBAR.

## 2021-06-05 NOTE — ED Notes (Addendum)
This RN called 2C again to find out what RN will be assigned to this pt so she could give report.

## 2021-06-05 NOTE — Plan of Care (Signed)

## 2021-06-05 NOTE — ED Provider Notes (Signed)
[facility] Provider Note    Event Date/Time   First MD Initiated Contact with Patient 06/05/21 1201     (approximate)   History   Shortness of Breath   HPI  Anna Barrett is a 88 y.o. female with a history of atrial fibrillation, aortic atherosclerosis, CKD, hypertension, esophageal stricture who comes the ED complaining of cough and shortness of breath, p.o. intolerance.  Symptoms have been ongoing for the past week, gradually worsening.  She denies any sudden onset of pain related to eating.  Reviewed electronic medical record which shows the patient had an EGD about 2 years ago, found to have an esophageal stricture which was dilated at that time.  Patient was seen in the emergency department 3 days ago,, treated for COPD exacerbation with prednisone and Augmentin and albuterol.  However, the patient has been unable to eat or drink anything since then.  Even with small amounts of water she gets discomfort and ends up regurgitating.  Has been unable to take prednisone Augmentin or any other medicine.  She did take a breathing treatment at home today which has improved her shortness of breath.  No fever.     Physical Exam   Triage Vital Signs: ED Triage Vitals  Enc Vitals Group     BP 06/05/21 1005 (!) 182/99     Pulse Rate 06/05/21 1005 87     Resp 06/05/21 1005 20     Temp 06/05/21 1005 98.4 F (36.9 C)     Temp Source 06/05/21 1005 Oral     SpO2 06/05/21 1005 98 %     Weight --      Height 06/05/21 1008 5\' 1"  (1.549 m)     Head Circumference --      Peak Flow --      Pain Score 06/05/21 1008 0     Pain Loc --      Pain Edu? --      Excl. in [place]? --     Most recent vital signs: Vitals:   06/05/21 1330 06/05/21 1400  BP: (!) 164/82 (!) 178/82  Pulse: 91 92  Resp:    Temp:    SpO2: 98% 96%     General: Awake, no distress.  CV:  Good peripheral perfusion.  Regular rate and rhythm Resp:  Normal effort.  Clear to auscultation  bilaterally, unlabored Abd:  No distention.  Soft nontender Other:  Dry mucous membranes.  Clear speech   ED Results / Procedures / Treatments   Labs (all labs ordered are listed, but only abnormal results are displayed) Labs Reviewed  BASIC METABOLIC PANEL - Abnormal; Notable for the following components:      Result Value   Glucose, Bld 132 (*)    BUN 49 (*)    Creatinine, Ser 1.07 (*)    GFR, Estimated 48 (*)    All other components within normal limits  RESP PANEL BY RT-PCR (FLU A&B, COVID) ARPGX2  CBC  TROPONIN I (HIGH SENSITIVITY)  TROPONIN I (HIGH SENSITIVITY)     EKG  Interpreted by me Sinus rhythm rate of 93, rightward axis.  Left bundle branch block.  No acute ischemic changes.  Unchanged from June 02, 2021.   RADIOLOGY Chest x-ray viewed and interpreted by me, unremarkable without lobar consolidation effusion or pneumothorax.  Radiology report reviewed    PROCEDURES:  Critical Care performed: No  Procedures   MEDICATIONS ORDERED IN ED: Medications  cefTRIAXone (ROCEPHIN) 2 g in  sodium chloride 0.9 % 100 mL IVPB (0 g Intravenous Stopped 06/05/21 1312)  azithromycin (ZITHROMAX) 500 mg in sodium chloride 0.9 % 250 mL IVPB (500 mg Intravenous New Bag/Given 06/05/21 1337)  cloNIDine (CATAPRES - Dosed in mg/24 hr) patch 0.1 mg (has no administration in time range)  metoprolol succinate (TOPROL-XL) 24 hr tablet 25 mg (has no administration in time range)  gabapentin (NEURONTIN) capsule 100 mg (has no administration in time range)  azelastine (ASTELIN) 0.1 % nasal spray 2 spray (has no administration in time range)  chlorpheniramine-HYDROcodone 10-8 MG/5ML suspension 2.5 mL (has no administration in time range)  ipratropium-albuterol (DUONEB) 0.5-2.5 (3) MG/3ML nebulizer solution 3 mL (has no administration in time range)  0.9 %  sodium chloride infusion (has no administration in time range)  acetaminophen (TYLENOL) tablet 650 mg (has no administration in time  range)    Or  acetaminophen (TYLENOL) suppository 650 mg (has no administration in time range)  traZODone (DESYREL) tablet 25 mg (has no administration in time range)  magnesium hydroxide (MILK OF MAGNESIA) suspension 30 mL (has no administration in time range)  ondansetron (ZOFRAN) tablet 4 mg (has no administration in time range)    Or  ondansetron (ZOFRAN) injection 4 mg (has no administration in time range)  enoxaparin (LOVENOX) injection 30 mg (has no administration in time range)  lactated ringers bolus 1,000 mL (1,000 mLs Intravenous New Bag/Given 06/05/21 1337)     IMPRESSION / MDM / ASSESSMENT AND PLAN / ED COURSE  I reviewed the triage vital signs and the nursing notes.                              Differential diagnosis includes, but is not limited to, aspiration pneumonia, COPD exacerbation, dehydration, electrolyte abnormality, esophageal obstruction     Patient presents with dysphagia, p.o. intolerance causing dehydration.  Presentation is not consistent with esophageal food bolus impaction, acute ischemic stroke, ACS, PE, dissection.  Chest x-ray is unremarkable, labs are reassuring other than elevated BUN consistent with dehydration.  Will give IV fluids.  Case discussed with GI Dr. Vicente Males who agrees with hospitalization, IV fluids and dysphagia work-up.  Would not do endoscopy urgently today.  Case discussed with hospitalist Dr. Sidney Ace.      FINAL CLINICAL IMPRESSION(S) / ED DIAGNOSES   Final diagnoses:  Dehydration  Dysphagia, unspecified type     Rx / DC Orders   ED Discharge Orders     None        Note:  This document was prepared using Dragon voice recognition software and may include unintentional dictation errors.   Carrie Mew, MD 06/05/21 1426

## 2021-06-05 NOTE — ED Notes (Signed)
Informed RN bed assigned 

## 2021-06-05 NOTE — ED Notes (Signed)
Pt resting comfortably in bed, NAD, family at [facility]. No needs verbalized at this time. Bed low & locked; call light & personal items within reach.

## 2021-06-05 NOTE — ED Notes (Signed)
Pt resting comfortably in bed, NAD. This RN brought pt mouth/lip moisturizer per request. No additional needs verbalized at this time. Family at [facility]. Bed low & locked; call light & personal items within reach.

## 2021-06-05 NOTE — ED Notes (Signed)
Medications are not verified by pharmacy at this time.

## 2021-06-05 NOTE — ED Triage Notes (Signed)
Pt to ED via POV with c/o cough and [facility] feels like she can not take a deep breath. She takes medication even if it liquid or crushed up she throws it back up, family doesn't think that she is getting her medication to help her that she was given here Saturday am.

## 2021-06-05 NOTE — ED Notes (Signed)
MD at BS

## 2021-06-05 NOTE — ED Notes (Signed)
This RN called 2C again to find out what RN will be assigned to this pt so she could give report; no answer.

## 2021-06-05 NOTE — ED Notes (Signed)
Pt placed on pulse ox & BP monitors.

## 2021-06-05 NOTE — H&P (Signed)
[place]   PATIENT NAME: Anna Barrett    MR#:  [id number]  DATE OF BIRTH:  02/26/1923  DATE OF ADMISSION:  06/05/2021  PRIMARY CARE PHYSICIAN: Kirk Ruths, MD   Patient is coming from: Home  REQUESTING/REFERRING PHYSICIAN: Brenton Grills, MD  CHIEF COMPLAINT:   Chief Complaint  Patient presents with   Shortness of Breath    HISTORY OF PRESENT ILLNESS:  Anna Barrett is a 88 y.o. Caucasian female with medical history significant for asthma, atrial fibrillation, dyslipidemia, hypertension, ischemic cardiomyopathy and osteoporosis as well as esophageal stricture, who presented to the ER with acute onset of cough, occasional wheezing as well as dyspnea with regurgitation and vomiting of all p.o. intake including water which has been going on over the last week and gradually worsening.  She has no abdominal pain but when she eats she feels something is stuck in the middle of her chest.  She had an EGD a couple of years ago revealing esophageal stricture that was dilated then.  She was seen in the ER 3 days ago and treated for mild COPD exacerbation with prednisone and p.o. Augmentin as well as albuterol.  She has been unable to take her medications.  No fever or chills.  No dysuria or oliguria or hematuria or flank pain.  No bleeding diathesis.  ED Course: When she came to the ER blood pressure was 182/99 with otherwise normal vital signs.  Labs revealed a BUN of 49 and a creatinine of 1.07 with glucose of 132.  High-sensitivity troponin I was 17 twice CBC was within normal.  Influenza antigens and COVID-19 PCR came back negative. EKG as reviewed by me : EKG showed sinus rhythm with rate of 93 with PACs and right axis deviation with septal Q waves with wide QRS complexes and T wave version inferolaterally and laterally. Imaging: 2 view chest x-ray showed COPD changes with no acute findings.  The patient was given IV Rocephin and Zithromax was unable to monitoring her.  She  will be admitted to a medical ED monitored observation bed for further evaluation and management.  PAST MEDICAL HISTORY:   Past Medical History:  Diagnosis Date   Asthma    Carotid stenosis    Headache    MIGRAINES   History of MI (myocardial infarction)    Hyperglycemia    Hyperlipidemia    Hypertension    Ischemic cardiomyopathy    Myocardial infarction [facility]) 1962   Osteoporosis    Parathyroid adenoma     PAST SURGICAL HISTORY:   Past Surgical History:  Procedure Laterality Date   ABDOMINAL HYSTERECTOMY     APPENDECTOMY     CATARACT EXTRACTION Bilateral    ESOPHAGOGASTRODUODENOSCOPY (EGD) WITH PROPOFOL N/A 04/10/2020   Procedure: ESOPHAGOGASTRODUODENOSCOPY (EGD) WITH PROPOFOL;  Surgeon: Toledo, Benay Pike, MD;  Location: [facility] ENDOSCOPY;  Service: Gastroenterology;  Laterality: N/A;   EYE SURGERY     FRACTURE SURGERY     ORIF ANKLE FRACTURE Left 04/17/2015   Procedure: OPEN REDUCTION INTERNAL FIXATION (ORIF) ANKLE FRACTURE;  Surgeon: Corky Mull, MD;  Location: [facility] ORS;  Service: Orthopedics;  Laterality: Left;    SOCIAL HISTORY:   Social History   Tobacco Use   Smoking status: Never   Smokeless tobacco: Never  Substance Use Topics   Alcohol use: No    FAMILY HISTORY:   Family History  Problem Relation Age of Onset   Heart disease Mother    Heart disease Father  DRUG ALLERGIES:   Allergies  Allergen Reactions   Atorvastatin Other (See Comments)   Iodine Other (See Comments)   Shellfish Allergy Other (See Comments)    REVIEW OF SYSTEMS:   ROS As per history of present illness. All pertinent systems were reviewed above. Constitutional, HEENT, cardiovascular, respiratory, GI, GU, musculoskeletal, neuro, psychiatric, endocrine, integumentary and hematologic systems were reviewed and are otherwise negative/unremarkable except for positive findings mentioned above in the HPI.   MEDICATIONS AT HOME:   Prior to Admission medications   Medication Sig  Start Date End Date Taking? Authorizing Provider  albuterol (VENTOLIN HFA) 108 (90 Base) MCG/ACT inhaler Inhale 2 puffs into the lungs every 6 (six) hours as needed for wheezing or shortness of breath. 06/03/21   Duffy Bruce, MD  amoxicillin-clavulanate (AUGMENTIN) 875-125 MG tablet Take 1 tablet by mouth 2 (two) times daily for 7 days. 06/03/21 06/10/21  Duffy Bruce, MD  aspirin EC 325 MG tablet Take 1 tablet (325 mg total) by mouth daily. Patient not taking: Reported on 05/07/2018 04/19/15   Gladstone Lighter, MD  azelastine (ASTELIN) 0.1 % nasal spray Place into the nose. 06/18/16 06/18/17  [provider]  CARTIA XT 120 MG 24 hr capsule Take 1 capsule by mouth daily. Patient not taking: Reported on 04/10/2020 04/05/15   [provider]  chlorpheniramine-HYDROcodone Amanda Cockayne PENNKINETIC ER) 10-8 MG/5ML Take 2.5 mLs by mouth every 12 (twelve) hours as needed for cough. 06/03/21   Paulette Blanch, MD  Cholecalciferol (D 2000) 2000 UNITS TABS Take 2,000 Units by mouth daily. Patient not taking: Reported on 04/04/2020    [provider]  cloNIDine (CATAPRES - DOSED IN MG/24 HR) 0.1 mg/24hr patch Place 0.1 mg onto the skin once a week.    [provider]  docusate sodium (COLACE) 100 MG capsule Take 1 capsule (100 mg total) by mouth 2 (two) times daily. Patient not taking: Reported on 05/07/2018 04/19/15   Gladstone Lighter, MD  ferrous sulfate 325 (65 FE) MG tablet Take 1 tablet (325 mg total) by mouth 2 (two) times daily with a meal. Patient not taking: Reported on 05/07/2018 04/19/15   Gladstone Lighter, MD  gabapentin (NEURONTIN) 100 MG capsule Take by mouth. 12/23/16 12/23/17  [provider]  ipratropium-albuterol (DUONEB) 0.5-2.5 (3) MG/3ML SOLN Take 3 mLs by nebulization every 4 (four) hours as needed for up to 20 days (SOB). 09/10/20 09/30/20  Rudene Re, MD  levalbuterol [facility]) 45 MCG/ACT inhaler Inhale into the lungs. 03/01/18 03/01/19   [provider]  lovastatin (MEVACOR) 20 MG tablet Take 20 mg by mouth at bedtime. Patient not taking: Reported on 04/04/2020    [provider]  metoprolol succinate (TOPROL-XL) 25 MG 24 hr tablet Take by mouth. 02/11/17 02/11/18  [provider]  oxyCODONE (OXY IR/ROXICODONE) 5 MG immediate release tablet Take 1-2 tablets (5-10 mg total) by mouth every 4 (four) hours as needed for breakthrough pain. Patient not taking: Reported on 05/07/2018 04/19/15   Lattie Corns, PA-C  predniSONE (DELTASONE) 20 MG tablet Take 2 tablets (40 mg total) by mouth daily for 5 days. 06/03/21 06/08/21  Duffy Bruce, MD  senna (SENOKOT) 8.6 MG TABS tablet Take 1 tablet (8.6 mg total) by mouth 2 (two) times daily as needed for mild constipation. Patient not taking: Reported on 05/07/2018 04/19/15   Gladstone Lighter, MD  torsemide (DEMADEX) 5 MG tablet Take 1 tablet by mouth daily. Patient not taking: Reported on 04/04/2020 03/16/15   [provider]  VITAL SIGNS:  Blood pressure (!) 178/82, pulse 92, temperature 98.4 F (36.9 C), temperature source Oral, resp. rate 20, height 5\' 1"  (1.549 m), SpO2 96 %.  PHYSICAL EXAMINATION:  Physical Exam  GENERAL:  88 y.o.-year-old cachectic Caucasian female patient lying in the bed with no acute distress.  EYES: Pupils equal, round, reactive to light and accommodation. No scleral icterus. Extraocular muscles intact.  HEENT: Head atraumatic, normocephalic. Oropharynx and nasopharynx clear.  NECK:  Supple, no jugular venous distention. No thyroid enlargement, no tenderness.  LUNGS: Mild diminished bibasilar breath sounds with slightly decreased expiratory airflow and occasional expiratory wheezes and rhonchi.  No use of accessory muscles of respiration.  CARDIOVASCULAR: Regular rate and rhythm, S1, S2 normal. No murmurs, rubs, or gallops.  ABDOMEN: Soft, nondistended, nontender. Bowel sounds present. No organomegaly or mass.   EXTREMITIES: No pedal edema, cyanosis, or clubbing.  NEUROLOGIC: Cranial nerves II through XII are intact. Muscle strength 5/5 in all extremities. Sensation intact. Gait not checked.  PSYCHIATRIC: The patient is alert and oriented x 3.  Normal affect and good eye contact. SKIN: No obvious rash, lesion, or ulcer.   LABORATORY PANEL:   CBC Recent Labs  Lab 06/05/21 1010  WBC 8.3  HGB 13.0  HCT 39.8  PLT 214   ------------------------------------------------------------------------------------------------------------------  Chemistries  Recent Labs  Lab 06/02/21 2125 06/05/21 1010  NA 139 145  K 4.3 3.9  CL 100 107  CO2 30 29  GLUCOSE 158* 132*  BUN 49* 49*  CREATININE 1.12* 1.07*  CALCIUM 9.1 9.1  AST 13*  --   ALT 14  --   ALKPHOS 62  --   BILITOT 0.7  --    ------------------------------------------------------------------------------------------------------------------  Cardiac Enzymes No results for input(s): TROPONINI in the last 168 hours. ------------------------------------------------------------------------------------------------------------------  RADIOLOGY:  DG Chest 2 View  Result Date: 06/05/2021 CLINICAL DATA:  Shortness of breath, cough EXAM: CHEST - 2 VIEW COMPARISON:  Chest radiograph 06/02/2021 FINDINGS: The cardiomediastinal silhouette is stable. The lungs are hyperinflated with flattening of the diaphragms consistent with COPD. There is no focal consolidation or pulmonary edema. There is no pleural effusion or pneumothorax. There is no acute osseous abnormality. IMPRESSION: COPD. Otherwise, no radiographic evidence of acute cardiopulmonary process. Overall no significant interval change since 06/02/2021. Electronically Signed   By: Valetta Mole M.D.   On: 06/05/2021 10:51      IMPRESSION AND PLAN:  Principal Problem:   Esophageal stricture  1.  Dysphagia likely due to esophageal stricture with current intolerance to liquids with vomiting,  dehydration and suspected microaspiration. - The patient will be admitted to a medical monitored bed. - She will be hydrated with IV normal saline. - GI consultation will be obtained. - Dr. Vicente Males was notified about the patient. - The patient will be kept n.p.o. currently.  2.  Essential hypertension. - We will continue Toprol-XL and cardia XT.  3.  Dyslipidemia. - We will continue statin therapy.  4.  Paroxysmal atrial fibrillation. - We will continue cardia XT.  Patient is not a candidate obviously for anticoagulation.   DVT prophylaxis: Lovenox.  Code Status: full code.  Family Communication:  The plan of care was discussed in details with the patient (and family). I answered all questions. The patient agreed to proceed with the above mentioned plan. Further management will depend upon hospital course. Disposition Plan: Back to previous home environment Consults called: Gastroenterology. All the records are reviewed and case discussed with ED provider.  Status is: Observation  deterioration and high frequency of surveillance required.  I certify that at the time of admission, it is my clinical judgment that the patient will require inpatient hospital care extending less than 2 midnights.                            Dispo: The patient is from: Home              Anticipated d/c is to: Home              Patient currently is not medically stable to d/c.              Difficult to place patient: No  Christel Mormon M.D on 06/05/2021 at 2:16 PM  [facility]   From 7 PM-7 AM, contact night-coverage [email or web address]  CC: Primary care physician; Kirk Ruths, MD

## 2021-06-06 ENCOUNTER — Observation Stay: Payer: PPO | Admitting: Anesthesiology

## 2021-06-06 ENCOUNTER — Encounter
Admission: EM | Disposition: A | Discharge status: Home / Self Care | Payer: Self-pay | Source: Home / Self Care | Attending: Emergency Medicine

## 2021-06-06 ENCOUNTER — Encounter: Payer: Self-pay | Admitting: Family Medicine

## 2021-06-06 DIAGNOSIS — K222 Esophageal obstruction: Secondary | ICD-10-CM | POA: Diagnosis not present

## 2021-06-06 DIAGNOSIS — R1310 Dysphagia, unspecified: Secondary | ICD-10-CM

## 2021-06-06 DIAGNOSIS — E785 Hyperlipidemia, unspecified: Secondary | ICD-10-CM | POA: Diagnosis not present

## 2021-06-06 DIAGNOSIS — K449 Diaphragmatic hernia without obstruction or gangrene: Secondary | ICD-10-CM | POA: Diagnosis not present

## 2021-06-06 DIAGNOSIS — T18128A Food in esophagus causing other injury, initial encounter: Secondary | ICD-10-CM | POA: Diagnosis not present

## 2021-06-06 HISTORY — PX: ESOPHAGOGASTRODUODENOSCOPY (EGD) WITH PROPOFOL: SHX5813

## 2021-06-06 LAB — BASIC METABOLIC PANEL
Anion gap: 9 (ref 5–15)
BUN: 40 mg/dL — ABNORMAL HIGH (ref 8–23)
CO2: 24 mmol/L (ref 22–32)
Calcium: 8.4 mg/dL — ABNORMAL LOW (ref 8.9–10.3)
Chloride: 111 mmol/L (ref 98–111)
Creatinine, Ser: 0.85 mg/dL (ref 0.44–1.00)
GFR, Estimated: >60 mL/min (ref >60)
Glucose, Bld: 93 mg/dL (ref 70–99)
Potassium: 3.7 mmol/L (ref 3.5–5.1)
Sodium: 144 mmol/L (ref 135–145)

## 2021-06-06 LAB — CBC
HCT: 35.9 % — ABNORMAL LOW (ref 36.0–46.0)
Hemoglobin: 11.7 g/dL — ABNORMAL LOW (ref 12.0–15.0)
MCH: 30.3 pg (ref 26.0–34.0)
MCHC: 32.6 g/dL (ref 30.0–36.0)
MCV: 93 fL (ref 80.0–100.0)
Platelets: 184 10*3/uL (ref 150–400)
RBC: 3.86 MIL/uL — ABNORMAL LOW (ref 3.87–5.11)
RDW: 13.1 % (ref 11.5–15.5)
WBC: 6.6 10*3/uL (ref 4.0–10.5)
nRBC: 0 % (ref 0.0–0.2)

## 2021-06-06 SURGERY — ESOPHAGOGASTRODUODENOSCOPY (EGD) WITH PROPOFOL
Anesthesia: General

## 2021-06-06 MED ORDER — DEXAMETHASONE SODIUM PHOSPHATE 10 MG/ML IJ SOLN
INTRAMUSCULAR | Status: DC | PRN
Start: 2021-06-06 — End: 2021-06-06
  Administered 2021-06-06: 8 mg via INTRAVENOUS

## 2021-06-06 MED ORDER — SODIUM CHLORIDE 0.9 % IV SOLN: INTRAVENOUS | Status: DC

## 2021-06-06 MED ORDER — AZELASTINE HCL 0.1 % NA SOLN: 1.0000 | Freq: Two times a day (BID) | NASAL | 12 refills | Status: DC

## 2021-06-06 MED ORDER — ONDANSETRON HCL 4 MG/2ML IJ SOLN
INTRAMUSCULAR | Status: DC | PRN
  Administered 2021-06-06: 4 mg via INTRAVENOUS

## 2021-06-06 MED ORDER — ONDANSETRON HCL 4 MG/2ML IJ SOLN
INTRAMUSCULAR | Status: AC
  Filled 2021-06-06: qty 2

## 2021-06-06 MED ORDER — DEXAMETHASONE SODIUM PHOSPHATE 10 MG/ML IJ SOLN
INTRAMUSCULAR | Status: AC
  Filled 2021-06-06: qty 1

## 2021-06-06 MED ORDER — PROPOFOL 10 MG/ML IV BOLUS
INTRAVENOUS | Status: DC | PRN
Start: 2021-06-06 — End: 2021-06-06
  Administered 2021-06-06 (×2): 40 mg via INTRAVENOUS

## 2021-06-06 MED ORDER — ENSURE ENLIVE PO LIQD
237.0000 mL | Freq: Three times a day (TID) | ORAL | Status: DC
  Administered 2021-06-06: 16:00:00 237 mL via ORAL

## 2021-06-06 MED ORDER — AMLODIPINE BESYLATE 5 MG PO TABS
5.0000 mg | ORAL_TABLET | Freq: Every day | ORAL | Status: DC
  Administered 2021-06-06: 16:00:00 5 mg via ORAL
  Filled 2021-06-06: qty 1

## 2021-06-06 MED ORDER — PROPOFOL 500 MG/50ML IV EMUL
INTRAVENOUS | Status: DC | PRN
  Administered 2021-06-06: 150 ug/kg/min via INTRAVENOUS

## 2021-06-06 MED ORDER — SUCCINYLCHOLINE CHLORIDE 200 MG/10ML IV SOSY
PREFILLED_SYRINGE | INTRAVENOUS | Status: AC
  Filled 2021-06-06: qty 10

## 2021-06-06 MED ORDER — IPRATROPIUM-ALBUTEROL 0.5-2.5 (3) MG/3ML IN SOLN
3.0000 mL | Freq: Three times a day (TID) | RESPIRATORY_TRACT | Status: DC
  Administered 2021-06-06: 13:00:00 3 mL via RESPIRATORY_TRACT
  Filled 2021-06-06: qty 3

## 2021-06-06 MED ORDER — LIDOCAINE HCL (CARDIAC) PF 100 MG/5ML IV SOSY
PREFILLED_SYRINGE | INTRAVENOUS | Status: DC | PRN
  Administered 2021-06-06: 50 mg via INTRAVENOUS

## 2021-06-06 MED ORDER — ENSURE ENLIVE PO LIQD: 237.0000 mL | Freq: Three times a day (TID) | ORAL | 12 refills | Status: DC

## 2021-06-06 MED ORDER — LIDOCAINE HCL (PF) 2 % IJ SOLN
INTRAMUSCULAR | Status: AC
  Filled 2021-06-06: qty 5

## 2021-06-06 MED ORDER — SUCCINYLCHOLINE CHLORIDE 200 MG/10ML IV SOSY
PREFILLED_SYRINGE | INTRAVENOUS | Status: DC | PRN
  Administered 2021-06-06: 50 mg via INTRAVENOUS

## 2021-06-06 MED ORDER — PROPOFOL 10 MG/ML IV BOLUS
INTRAVENOUS | Status: AC
  Filled 2021-06-06: qty 20

## 2021-06-06 NOTE — Op Note (Signed)
[facility] Patient Name: Anna Barrett Procedure Date: 06/06/2021 8:32 AM MRN: [id number] Account #: [id number] Date of Birth: March 10, 1923 Admit Type: Inpatient Age: 88 Room: [facility] ENDO ROOM 3 Gender: Female Note Status: Finalized Instrument Name: Altamese Cabal Endoscope [id number] Procedure:             Upper GI endoscopy Indications:           Foreign body in the esophagus Providers:             Jonathon Bellows MD, MD Referring MD:          Ocie Cornfield. Ouida Sills MD, MD (Referring MD) Medicines:             Monitored Anesthesia Care Complications:         No immediate complications. Procedure:             Pre-Anesthesia Assessment:                        - Prior to the procedure, a History and Physical was                         performed, and patient medications, allergies and                         sensitivities were reviewed. The patient's tolerance                         of previous anesthesia was reviewed.                        - The risks and benefits of the procedure and the                         sedation options and risks were discussed with the                         patient. All questions were answered and informed                         consent was obtained.                        - ASA Grade Assessment: III - A patient with severe                         systemic disease.                        After obtaining informed consent, the endoscope was                         passed under direct vision. Throughout the procedure,                         the patient's blood pressure, pulse, and oxygen                         saturations were monitored continuously. The Endoscope  was introduced through the mouth, and advanced to the                         third part of duodenum. The upper GI endoscopy was                         accomplished without difficulty. The patient tolerated                         the procedure  well. Findings:      Food was found in the lower third of the esophagus. Removal of food was       accomplished.      The lumen of the lower third of the esophagus was severely dilated.      No appreciable esophageal motility was noted. In addition, a hypertonic       lower esophageal sphincter was found. There was mild resistance to       endoscope advancement into the stomach.      One benign-appearing, intrinsic mild (non-circumferential scarring)       stenosis was found at the gastroesophageal junction. This stenosis       measured 1.4 cm (inner diameter) x 1 cm (in length). The stenosis was       traversed.      A medium-sized hiatal hernia was present.      The examined duodenum was normal. Impression:            - Food in the lower third of the esophagus. Removal                         was successful.                        - Dilation in the lower third of the esophagus.                        - The examination was suspicious for achalasia.                        - Benign-appearing esophageal stenosis.                        - Medium-sized hiatal hernia.                        - Normal examined duodenum. Recommendation:        - Return patient to hospital ward for ongoing care.                        - Keep head end of the bed elevated at 45 degrees at                         all times due to risk of aspiration as i suspect she                         may have achalasia.Large qty of semi solid food                         probably many days old taken out .  Priosec 40 mg daily for next 6 weeks                        Liquid diet till repeart EGD in 1 week with [facility]                         clinic GI . Impaired visualization today due to large                         qty of liquid and semi solid debris today , coulnt                         dilate safely. Consider botox if achalasia is                         diagnosed.                        GI will  sign off Procedure Code(s):     --- Professional ---                        [phone], Esophagogastroduodenoscopy, flexible,                         transoral; with removal of foreign body(s) Diagnosis Code(s):     --- Professional ---                        X93.716R, Food in esophagus causing other injury,                         initial encounter                        K22.8, Other specified diseases of esophagus                        K22.2, Esophageal obstruction                        K44.9, Diaphragmatic hernia without obstruction or                         gangrene                        T18.108A, Unspecified foreign body in esophagus                         causing other injury, initial encounter CPT copyright 2019 [employer] Medical Association. All rights reserved. The codes documented in this report are preliminary and upon coder review may  be revised to meet current compliance requirements. Jonathon Bellows, MD Jonathon Bellows MD, MD 06/06/2021 9:09:21 AM This report has been signed electronically. Number of Addenda: 0 Note Initiated On: 06/06/2021 8:32 AM Estimated Blood Loss:  Estimated blood loss: none.      [facility]

## 2021-06-06 NOTE — Progress Notes (Signed)
Pt scored a 2 on the RT protocol assessment. The nebulizer treatments are to be changed from QID to TID.

## 2021-06-06 NOTE — Transfer of Care (Signed)
Immediate Anesthesia Transfer of Care Note  Patient: Anna Barrett  Procedure(s) Performed: ESOPHAGOGASTRODUODENOSCOPY (EGD) WITH PROPOFOL  Patient Location: PACU  Anesthesia Type:General  Level of Consciousness: awake and alert   Airway & Oxygen Therapy: Patient Spontanous Breathing and Patient connected to nasal cannula oxygen  Post-op Assessment: Report given to RN and Post -op Vital signs reviewed and stable  Post vital signs: Reviewed and stable  Last Vitals:  Vitals Value Taken Time  BP 180/53 06/06/21 0918  Temp 37.3 C 06/06/21 0918  Pulse 83 06/06/21 0920  Resp 28 06/06/21 0920  SpO2 99 % 06/06/21 0920    Last Pain:  Vitals:   06/06/21 0824  TempSrc: Temporal  PainSc:          Complications: No notable events documented.

## 2021-06-06 NOTE — H&P (Signed)
Jonathon Bellows, MD [address] [place], [place], [place], [address] [address], [place], [place], [address] Phone: [phone]  Fax: [phone]  Primary Care Physician:  Kirk Ruths, MD   Pre-Procedure History & Physical: HPI:  Anna Barrett is a 88 y.o. female is here for an endoscopy    Past Medical History:  Diagnosis Date   Asthma    Carotid stenosis    Headache    MIGRAINES   History of MI (myocardial infarction)    Hyperglycemia    Hyperlipidemia    Hypertension    Ischemic cardiomyopathy    Myocardial infarction [facility]) 1962   Osteoporosis    Parathyroid adenoma     Past Surgical History:  Procedure Laterality Date   ABDOMINAL HYSTERECTOMY     APPENDECTOMY     CATARACT EXTRACTION Bilateral    ESOPHAGOGASTRODUODENOSCOPY (EGD) WITH PROPOFOL N/A 04/10/2020   Procedure: ESOPHAGOGASTRODUODENOSCOPY (EGD) WITH PROPOFOL;  Surgeon: Toledo, Benay Pike, MD;  Location: [facility] ENDOSCOPY;  Service: Gastroenterology;  Laterality: N/A;   EYE SURGERY     FRACTURE SURGERY     ORIF ANKLE FRACTURE Left 04/17/2015   Procedure: OPEN REDUCTION INTERNAL FIXATION (ORIF) ANKLE FRACTURE;  Surgeon: Corky Mull, MD;  Location: [facility] ORS;  Service: Orthopedics;  Laterality: Left;    Prior to Admission medications   Medication Sig Start Date End Date Taking? Authorizing Provider  albuterol (VENTOLIN HFA) 108 (90 Base) MCG/ACT inhaler Inhale 2 puffs into the lungs every 6 (six) hours as needed for wheezing or shortness of breath. 06/03/21  Yes Duffy Bruce, MD  amoxicillin-clavulanate (AUGMENTIN) 875-125 MG tablet Take 1 tablet by mouth 2 (two) times daily for 7 days. 06/03/21 06/10/21 Yes Duffy Bruce, MD  aspirin EC 325 MG tablet Take 1 tablet (325 mg total) by mouth daily. 04/19/15  Yes Gladstone Lighter, MD  Cholecalciferol 50 MCG (2000 UT) TABS Take 2,000 Units by mouth daily.   Yes [provider]  cloNIDine (CATAPRES - DOSED IN MG/24 HR) 0.1 mg/24hr  patch Place 0.1 mg onto the skin once a week.   Yes [provider]  ferrous sulfate 325 (65 FE) MG tablet Take 1 tablet (325 mg total) by mouth 2 (two) times daily with a meal. 04/19/15  Yes Gladstone Lighter, MD  ipratropium-albuterol (DUONEB) 0.5-2.5 (3) MG/3ML SOLN Take 3 mLs by nebulization every 4 (four) hours as needed for up to 20 days (SOB). 09/10/20 06/05/21 Yes Alfred Levins, [place], MD  levalbuterol [facility]) 45 MCG/ACT inhaler Inhale into the lungs. 03/01/18 06/05/21 Yes [provider]  predniSONE (DELTASONE) 20 MG tablet Take 2 tablets (40 mg total) by mouth daily for 5 days. 06/03/21 06/08/21 Yes Duffy Bruce, MD  azelastine (ASTELIN) 0.1 % nasal spray Place into the nose. 06/18/16 06/18/17  [provider]  CARTIA XT 120 MG 24 hr capsule Take 1 capsule by mouth daily. Patient not taking: Reported on 04/10/2020 04/05/15   [provider]  chlorpheniramine-HYDROcodone Amanda Cockayne PENNKINETIC ER) 10-8 MG/5ML Take 2.5 mLs by mouth every 12 (twelve) hours as needed for cough. 06/03/21   Paulette Blanch, MD  docusate sodium (COLACE) 100 MG capsule Take 1 capsule (100 mg total) by mouth 2 (two) times daily. Patient not taking: Reported on 05/07/2018 04/19/15   Gladstone Lighter, MD  gabapentin (NEURONTIN) 100 MG capsule Take by mouth. Patient not taking: Reported on 06/05/2021 12/23/16 12/23/17  [provider]  lovastatin (MEVACOR) 20 MG tablet Take 20 mg by  mouth at bedtime. Patient not taking: Reported on 04/04/2020    [provider]  metoprolol succinate (TOPROL-XL) 25 MG 24 hr tablet Take by mouth. Patient not taking: Reported on 06/05/2021 02/11/17 02/11/18  [provider]  oxyCODONE (OXY IR/ROXICODONE) 5 MG immediate release tablet Take 1-2 tablets (5-10 mg total) by mouth every 4 (four) hours as needed for breakthrough pain. Patient not taking: Reported on 05/07/2018 04/19/15   Lattie Corns, PA-C  senna (SENOKOT) 8.6 MG TABS  tablet Take 1 tablet (8.6 mg total) by mouth 2 (two) times daily as needed for mild constipation. Patient not taking: Reported on 05/07/2018 04/19/15   Gladstone Lighter, MD  torsemide (DEMADEX) 5 MG tablet Take 1 tablet by mouth daily. Patient not taking: Reported on 04/04/2020 03/16/15   [provider]    Allergies as of 06/05/2021 - Review Complete 06/05/2021  Allergen Reaction Noted   Atorvastatin Other (See Comments) 04/28/2003   Iodine Other (See Comments) 04/17/2015   Shellfish allergy Other (See Comments) 11/10/2007    Family History  Problem Relation Age of Onset   Heart disease Mother    Heart disease Father     Social History   Socioeconomic History   Marital status: Widowed    Spouse name: Not on file   Number of children: Not on file   Years of education: Not on file   Highest education level: Not on file  Occupational History   Not on file  Tobacco Use   Smoking status: Never   Smokeless tobacco: Never  Vaping Use   Vaping Use: Never used  Substance and Sexual Activity   Alcohol use: No   Drug use: Never   Sexual activity: Never  Other Topics Concern   Not on file  Social History Narrative   Not on file   Social Determinants of Health   Financial Resource Strain: Not on file  Food Insecurity: Not on file  Transportation Needs: Not on file  Physical Activity: Not on file  Stress: Not on file  Social Connections: Not on file  Intimate Partner Violence: Not on file    Review of Systems: See HPI, otherwise negative ROS  Physical Exam: BP (!) 178/89    Pulse 86    Temp (!) 97.1 F (36.2 C) (Temporal)    Resp 20    Ht 5\' 1"  (1.549 m)    SpO2 95%    BMI 16.82 kg/m  General:   Alert,  pleasant and cooperative in NAD Head:  Normocephalic and atraumatic. Neck:  Supple; no masses or thyromegaly. Lungs:  Clear throughout to auscultation, normal respiratory effort.    Heart:  +S1, +S2, Regular rate and rhythm, No edema. Abdomen:  Soft,  nontender and nondistended. Normal bowel sounds, without guarding, and without rebound.   Neurologic:  Alert and  oriented x4;  grossly normal neurologically.  Impression/Plan: Anna Barrett is here for an endoscopy  to be performed for  evaluation of dysphagia    Risks, benefits, limitations, and alternatives regarding endoscopy have been reviewed with the patient.  Questions have been answered.  All parties agreeable.   Jonathon Bellows, MD  06/06/2021, 8:28 AM

## 2021-06-06 NOTE — H&P (Signed)
Jonathon Bellows, MD [address] [place], [place], [place], [address] [address], [place], [place], [place], [address] Phone: [phone]  Fax: [phone]  Primary Care Physician:  Kirk Ruths, MD   Pre-Procedure History & Physical: HPI:  Anna Barrett is a 88 y.o. female is here for an endoscopy    Past Medical History:  Diagnosis Date   Asthma    Carotid stenosis    Headache    MIGRAINES   History of MI (myocardial infarction)    Hyperglycemia    Hyperlipidemia    Hypertension    Ischemic cardiomyopathy    Myocardial infarction [facility]) 1962   Osteoporosis    Parathyroid adenoma     Past Surgical History:  Procedure Laterality Date   ABDOMINAL HYSTERECTOMY     APPENDECTOMY     CATARACT EXTRACTION Bilateral    ESOPHAGOGASTRODUODENOSCOPY (EGD) WITH PROPOFOL N/A 04/10/2020   Procedure: ESOPHAGOGASTRODUODENOSCOPY (EGD) WITH PROPOFOL;  Surgeon: Toledo, Benay Pike, MD;  Location: [facility] ENDOSCOPY;  Service: Gastroenterology;  Laterality: N/A;   EYE SURGERY     FRACTURE SURGERY     ORIF ANKLE FRACTURE Left 04/17/2015   Procedure: OPEN REDUCTION INTERNAL FIXATION (ORIF) ANKLE FRACTURE;  Surgeon: Corky Mull, MD;  Location: [facility] ORS;  Service: Orthopedics;  Laterality: Left;    Prior to Admission medications   Medication Sig Start Date End Date Taking? Authorizing Provider  albuterol (VENTOLIN HFA) 108 (90 Base) MCG/ACT inhaler Inhale 2 puffs into the lungs every 6 (six) hours as needed for wheezing or shortness of breath. 06/03/21  Yes Duffy Bruce, MD  amoxicillin-clavulanate (AUGMENTIN) 875-125 MG tablet Take 1 tablet by mouth 2 (two) times daily for 7 days. 06/03/21 06/10/21 Yes Duffy Bruce, MD  aspirin EC 325 MG tablet Take 1 tablet (325 mg total) by mouth daily. 04/19/15  Yes Gladstone Lighter, MD  Cholecalciferol 50 MCG (2000 UT) TABS Take 2,000 Units by mouth daily.   Yes [provider]  cloNIDine (CATAPRES - DOSED IN MG/24 HR) 0.1 mg/24hr  patch Place 0.1 mg onto the skin once a week.   Yes [provider]  ferrous sulfate 325 (65 FE) MG tablet Take 1 tablet (325 mg total) by mouth 2 (two) times daily with a meal. 04/19/15  Yes Gladstone Lighter, MD  ipratropium-albuterol (DUONEB) 0.5-2.5 (3) MG/3ML SOLN Take 3 mLs by nebulization every 4 (four) hours as needed for up to 20 days (SOB). 09/10/20 06/05/21 Yes Alfred Levins, [place], MD  levalbuterol [facility]) 45 MCG/ACT inhaler Inhale into the lungs. 03/01/18 06/05/21 Yes [provider]  predniSONE (DELTASONE) 20 MG tablet Take 2 tablets (40 mg total) by mouth daily for 5 days. 06/03/21 06/08/21 Yes Duffy Bruce, MD  azelastine (ASTELIN) 0.1 % nasal spray Place into the nose. 06/18/16 06/18/17  [provider]  CARTIA XT 120 MG 24 hr capsule Take 1 capsule by mouth daily. Patient not taking: Reported on 04/10/2020 04/05/15   [provider]  chlorpheniramine-HYDROcodone Amanda Cockayne PENNKINETIC ER) 10-8 MG/5ML Take 2.5 mLs by mouth every 12 (twelve) hours as needed for cough. 06/03/21   Paulette Blanch, MD  docusate sodium (COLACE) 100 MG capsule Take 1 capsule (100 mg total) by mouth 2 (two) times daily. Patient not taking: Reported on 05/07/2018 04/19/15   Gladstone Lighter, MD  gabapentin (NEURONTIN) 100 MG capsule Take by mouth. Patient not taking: Reported on 06/05/2021 12/23/16 12/23/17  [provider]  lovastatin (MEVACOR) 20 MG tablet Take 20 mg by  mouth at bedtime. Patient not taking: Reported on 04/04/2020    [provider]  metoprolol succinate (TOPROL-XL) 25 MG 24 hr tablet Take by mouth. Patient not taking: Reported on 06/05/2021 02/11/17 02/11/18  [provider]  oxyCODONE (OXY IR/ROXICODONE) 5 MG immediate release tablet Take 1-2 tablets (5-10 mg total) by mouth every 4 (four) hours as needed for breakthrough pain. Patient not taking: Reported on 05/07/2018 04/19/15   Lattie Corns, PA-C  senna (SENOKOT) 8.6 MG TABS  tablet Take 1 tablet (8.6 mg total) by mouth 2 (two) times daily as needed for mild constipation. Patient not taking: Reported on 05/07/2018 04/19/15   Gladstone Lighter, MD  torsemide (DEMADEX) 5 MG tablet Take 1 tablet by mouth daily. Patient not taking: Reported on 04/04/2020 03/16/15   [provider]    Allergies as of 06/05/2021 - Review Complete 06/05/2021  Allergen Reaction Noted   Atorvastatin Other (See Comments) 04/28/2003   Iodine Other (See Comments) 04/17/2015   Shellfish allergy Other (See Comments) 11/10/2007    Family History  Problem Relation Age of Onset   Heart disease Mother    Heart disease Father     Social History   Socioeconomic History   Marital status: Widowed    Spouse name: Not on file   Number of children: Not on file   Years of education: Not on file   Highest education level: Not on file  Occupational History   Not on file  Tobacco Use   Smoking status: Never   Smokeless tobacco: Never  Vaping Use   Vaping Use: Never used  Substance and Sexual Activity   Alcohol use: No   Drug use: Never   Sexual activity: Never  Other Topics Concern   Not on file  Social History Narrative   Not on file   Social Determinants of Health   Financial Resource Strain: Not on file  Food Insecurity: Not on file  Transportation Needs: Not on file  Physical Activity: Not on file  Stress: Not on file  Social Connections: Not on file  Intimate Partner Violence: Not on file    Review of Systems: See HPI, otherwise negative ROS  Physical Exam: BP (!) 178/89    Pulse 86    Temp (!) 97.1 F (36.2 C) (Temporal)    Resp 20    Ht 5\' 1"  (1.549 m)    SpO2 95%    BMI 16.82 kg/m  General:   Alert,  pleasant and cooperative in NAD Head:  Normocephalic and atraumatic. Neck:  Supple; no masses or thyromegaly. Lungs:  Clear throughout to auscultation, normal respiratory effort.    Heart:  +S1, +S2, Regular rate and rhythm, No edema. Abdomen:  Soft,  nontender and nondistended. Normal bowel sounds, without guarding, and without rebound.   Neurologic:  Alert and  oriented x4;  grossly normal neurologically.  Impression/Plan: Anna Barrett is here for an endoscopy  to be performed for  evaluation of dysphagia    Risks, benefits, limitations, and alternatives regarding endoscopy have been reviewed with the patient.  Questions have been answered.  All parties agreeable.   Jonathon Bellows, MD  06/06/2021, 8:33 AM

## 2021-06-06 NOTE — Anesthesia Preprocedure Evaluation (Signed)
Anesthesia Evaluation  Patient identified by MRN, date of birth, ID band Patient awake    Reviewed: Allergy & Precautions, NPO status , Patient's Chart, lab work & pertinent test results  History of Anesthesia Complications Negative for: history of anesthetic complications  Airway Mallampati: III  TM Distance: <3 FB Neck ROM: full    Dental  (+) Chipped, Poor Dentition, Missing   Pulmonary shortness of breath, asthma , pneumonia, unresolved,    Pulmonary exam normal        Cardiovascular Exercise Tolerance: Good hypertension, (-) angina+ Past MI and + Peripheral Vascular Disease  Normal cardiovascular exam     Neuro/Psych  Headaches, negative psych ROS   GI/Hepatic negative GI ROS, Neg liver ROS, neg GERD  ,  Endo/Other  negative endocrine ROS  Renal/GU Renal disease  negative genitourinary   Musculoskeletal   Abdominal   Peds  Hematology negative hematology ROS (+)   Anesthesia Other Findings Patient is NPO appropriate and reports no nausea or vomiting today.  History of esophageal stricture.   Patient reports that she is currently able to swallow her saliva and does not feel like there is anything stuck in her throat at this time.  Past Medical History: No date: Asthma No date: Carotid stenosis No date: Headache     Comment:  MIGRAINES No date: History of MI (myocardial infarction) No date: Hyperglycemia No date: Hyperlipidemia No date: Hypertension No date: Ischemic cardiomyopathy 1962: Myocardial infarction ([place]) No date: Osteoporosis No date: Parathyroid adenoma  Past Surgical History: No date: ABDOMINAL HYSTERECTOMY No date: APPENDECTOMY No date: CATARACT EXTRACTION; Bilateral 04/10/2020: ESOPHAGOGASTRODUODENOSCOPY (EGD) WITH PROPOFOL; N/A     Comment:  Procedure: ESOPHAGOGASTRODUODENOSCOPY (EGD) WITH               PROPOFOL;  Surgeon: Toledo, Benay Pike, MD;  Location:               [facility]  ENDOSCOPY;  Service: Gastroenterology;  Laterality:               N/A; No date: EYE SURGERY No date: FRACTURE SURGERY 04/17/2015: ORIF ANKLE FRACTURE; Left     Comment:  Procedure: OPEN REDUCTION INTERNAL FIXATION (ORIF) ANKLE              FRACTURE;  Surgeon: Corky Mull, MD;  Location: [facility]               ORS;  Service: Orthopedics;  Laterality: Left;  BMI    Body Mass Index: 16.82 kg/m      Reproductive/Obstetrics negative OB ROS                             Anesthesia Physical Anesthesia Plan  ASA: 3  Anesthesia Plan: General   Post-op Pain Management:    Induction: Intravenous  PONV Risk Score and Plan: Propofol infusion and TIVA  Airway Management Planned: Natural Airway and Nasal Cannula  Additional Equipment:   Intra-op Plan:   Post-operative Plan:   Informed Consent: I have reviewed the patients History and Physical, chart, labs and discussed the procedure including the risks, benefits and alternatives for the proposed anesthesia with the patient or authorized representative who has indicated his/her understanding and acceptance.     Dental Advisory Given  Plan Discussed with: Anesthesiologist, CRNA and Surgeon  Anesthesia Plan Comments: (Patient consented for risks of anesthesia including but not limited to:  - adverse reactions to medications - risk of airway placement  if required - damage to eyes, teeth, lips or other oral mucosa - nerve damage due to positioning  - sore throat or hoarseness - Damage to heart, brain, nerves, lungs, other parts of body or loss of life  Patient voiced understanding.)        Anesthesia Quick Evaluation

## 2021-06-06 NOTE — Consult Note (Signed)
Anna Barrett , MD [address], [place], [place], [place], [address] [address], [place], [place], [place], [address] Phone: [phone]  Fax: [phone]  Consultation  Referring Provider:    ER  Primary Care Physician:  Kirk Ruths, MD Primary Gastroenterologist:  Dr. Alice Reichert         Reason for Consultation:     Dysphagia  Date of Admission:  06/05/2021 Date of Consultation:  06/06/2021         HPI:   Anna Barrett is a 88 y.o. female presented to the emergency room with shortness of breath.  She has a history of atrial fibrillation but not on any blood thinners.  History of esophageal stricture that was dilated by Dr. Alice Reichert back in 2021.  She states that she has been having issues with dysphagia for weeks if not longer can tolerate liquids but certain solids get stuck in her throat.  States her throat is also very dry.  She is being treated for a COPD exacerbation.  When I saw her prior to the procedure she appeared very comfortable.  Able to speak complete sentences.  Past Medical History:  Diagnosis Date   Asthma    Carotid stenosis    Headache    MIGRAINES   History of MI (myocardial infarction)    Hyperglycemia    Hyperlipidemia    Hypertension    Ischemic cardiomyopathy    Myocardial infarction [facility]) 1962   Osteoporosis    Parathyroid adenoma     Past Surgical History:  Procedure Laterality Date   ABDOMINAL HYSTERECTOMY     APPENDECTOMY     CATARACT EXTRACTION Bilateral    ESOPHAGOGASTRODUODENOSCOPY (EGD) WITH PROPOFOL N/A 04/10/2020   Procedure: ESOPHAGOGASTRODUODENOSCOPY (EGD) WITH PROPOFOL;  Surgeon: Toledo, Benay Pike, MD;  Location: [facility] ENDOSCOPY;  Service: Gastroenterology;  Laterality: N/A;   EYE SURGERY     FRACTURE SURGERY     ORIF ANKLE FRACTURE Left 04/17/2015   Procedure: OPEN REDUCTION INTERNAL FIXATION (ORIF) ANKLE FRACTURE;  Surgeon: Corky Mull, MD;  Location: [facility] ORS;  Service: Orthopedics;  Laterality: Left;    Prior to  Admission medications   Medication Sig Start Date End Date Taking? Authorizing Provider  albuterol (VENTOLIN HFA) 108 (90 Base) MCG/ACT inhaler Inhale 2 puffs into the lungs every 6 (six) hours as needed for wheezing or shortness of breath. 06/03/21  Yes Duffy Bruce, MD  amoxicillin-clavulanate (AUGMENTIN) 875-125 MG tablet Take 1 tablet by mouth 2 (two) times daily for 7 days. 06/03/21 06/10/21 Yes Duffy Bruce, MD  aspirin EC 325 MG tablet Take 1 tablet (325 mg total) by mouth daily. 04/19/15  Yes Gladstone Lighter, MD  Cholecalciferol 50 MCG (2000 UT) TABS Take 2,000 Units by mouth daily.   Yes [provider]  cloNIDine (CATAPRES - DOSED IN MG/24 HR) 0.1 mg/24hr patch Place 0.1 mg onto the skin once a week.   Yes [provider]  ferrous sulfate 325 (65 FE) MG tablet Take 1 tablet (325 mg total) by mouth 2 (two) times daily with a meal. 04/19/15  Yes Gladstone Lighter, MD  ipratropium-albuterol (DUONEB) 0.5-2.5 (3) MG/3ML SOLN Take 3 mLs by nebulization every 4 (four) hours as needed for up to 20 days (SOB). 09/10/20 06/05/21 Yes Alfred Levins, [place], MD  levalbuterol [facility]) 45 MCG/ACT inhaler Inhale into the lungs. 03/01/18 06/05/21 Yes [provider]  predniSONE (DELTASONE) 20 MG tablet Take 2 tablets (40 mg total) by mouth daily for 5 days. 06/03/21  06/08/21 Yes Duffy Bruce, MD  azelastine (ASTELIN) 0.1 % nasal spray Place into the nose. 06/18/16 06/18/17  [provider]  CARTIA XT 120 MG 24 hr capsule Take 1 capsule by mouth daily. Patient not taking: Reported on 04/10/2020 04/05/15   [provider]  chlorpheniramine-HYDROcodone Amanda Cockayne PENNKINETIC ER) 10-8 MG/5ML Take 2.5 mLs by mouth every 12 (twelve) hours as needed for cough. 06/03/21   Paulette Blanch, MD  docusate sodium (COLACE) 100 MG capsule Take 1 capsule (100 mg total) by mouth 2 (two) times daily. Patient not taking: Reported on 05/07/2018 04/19/15   Gladstone Lighter, MD   gabapentin (NEURONTIN) 100 MG capsule Take by mouth. Patient not taking: Reported on 06/05/2021 12/23/16 12/23/17  [provider]  lovastatin (MEVACOR) 20 MG tablet Take 20 mg by mouth at bedtime. Patient not taking: Reported on 04/04/2020    [provider]  metoprolol succinate (TOPROL-XL) 25 MG 24 hr tablet Take by mouth. Patient not taking: Reported on 06/05/2021 02/11/17 02/11/18  [provider]  oxyCODONE (OXY IR/ROXICODONE) 5 MG immediate release tablet Take 1-2 tablets (5-10 mg total) by mouth every 4 (four) hours as needed for breakthrough pain. Patient not taking: Reported on 05/07/2018 04/19/15   Lattie Corns, PA-C  senna (SENOKOT) 8.6 MG TABS tablet Take 1 tablet (8.6 mg total) by mouth 2 (two) times daily as needed for mild constipation. Patient not taking: Reported on 05/07/2018 04/19/15   Gladstone Lighter, MD  torsemide (DEMADEX) 5 MG tablet Take 1 tablet by mouth daily. Patient not taking: Reported on 04/04/2020 03/16/15   [provider]    Family History  Problem Relation Age of Onset   Heart disease Mother    Heart disease Father      Social History   Tobacco Use   Smoking status: Never   Smokeless tobacco: Never  Vaping Use   Vaping Use: Never used  Substance Use Topics   Alcohol use: No   Drug use: Never    Allergies as of 06/05/2021 - Review Complete 06/05/2021  Allergen Reaction Noted   Atorvastatin Other (See Comments) 04/28/2003   Iodine Other (See Comments) 04/17/2015   Shellfish allergy Other (See Comments) 11/10/2007    Review of Systems:    All systems reviewed and negative except where noted in HPI.   Physical Exam:  Vital signs in last 24 hours: Temp:  [97.6 F (36.4 C)-98.6 F (37 C)] 97.6 F (36.4 C) (01/25 0733) Pulse Rate:  [75-95] 75 (01/25 0733) Resp:  [18-20] 18 (01/25 0320) BP: (153-183)/(56-99) 161/66 (01/25 0733) SpO2:  [93 %-98 %] 93 % (01/25 0733)   General:   Pleasant, cooperative  in NAD Head:  Normocephalic and atraumatic. Eyes:   No icterus.   Conjunctiva pink. PERRLA. Ears:  Normal auditory acuity. Neck:  Supple; no masses or thyroidomegaly Lungs: Decreased air entry bilaterally noted breath sounds Heart:  Regular rate and rhythm;  Without murmur, clicks, rubs or gallops Abdomen:  Soft, nondistended, nontender. Normal bowel sounds. No appreciable masses or hepatomegaly.  No rebound or guarding.  Neurologic:  Alert and oriented x3;  grossly normal neurologically. Skin:  Intact without significant lesions or rashes. Cervical Nodes:  No significant cervical adenopathy. Psych:  Alert and cooperative. Normal affect.  LAB RESULTS: Recent Labs    06/05/21 1010 06/06/21 0551  WBC 8.3 6.6  HGB 13.0 11.7*  HCT 39.8 35.9*  PLT 214 184   BMET Recent Labs    06/05/21 1010 06/06/21 0551  NA 145 144  K 3.9 3.7  CL 107 111  CO2 29 24  GLUCOSE 132* 93  BUN 49* 40*  CREATININE 1.07* 0.85  CALCIUM 9.1 8.4*   LFT No results for input(s): PROT, ALBUMIN, AST, ALT, ALKPHOS, BILITOT, BILIDIR, IBILI in the last 72 hours. PT/INR No results for input(s): LABPROT, INR in the last 72 hours.  STUDIES: DG Chest 2 View  Result Date: 06/05/2021 CLINICAL DATA:  Shortness of breath, cough EXAM: CHEST - 2 VIEW COMPARISON:  Chest radiograph 06/02/2021 FINDINGS: The cardiomediastinal silhouette is stable. The lungs are hyperinflated with flattening of the diaphragms consistent with COPD. There is no focal consolidation or pulmonary edema. There is no pleural effusion or pneumothorax. There is no acute osseous abnormality. IMPRESSION: COPD. Otherwise, no radiographic evidence of acute cardiopulmonary process. Overall no significant interval change since 06/02/2021. Electronically Signed   By: Valetta Mole M.D.   On: 06/05/2021 10:51      Impression / Plan:   Anna Barrett is a 88 y.o. y/o female with a history of an esophageal stricture dilated back in 2021 presents to the  emergency room with a COPD exacerbation after complaints of shortness of breath in addition has been having progressive dysphagia for more than a few weeks.  She appears very comfortable able to tolerate her secretions lying comfortably in the bed appears that the issue primarily is a recurrence of her stricture   Plan  EGD  I have discussed alternative options, risks & benefits,  which include, but are not limited to, bleeding, infection, perforation,respiratory complication & drug reaction.  The patient agrees with this plan & written consent will be obtained.     Dr Anna Bellows MD,MRCP [facility]) Gastroenterology/Hepatology Pager: [phone]

## 2021-06-06 NOTE — Anesthesia Postprocedure Evaluation (Signed)
Anesthesia Post Note  Patient: Anna Barrett  Procedure(s) Performed: ESOPHAGOGASTRODUODENOSCOPY (EGD) WITH PROPOFOL  Patient location during evaluation: PACU Anesthesia Type: General Level of consciousness: awake and alert Pain management: pain level controlled Vital Signs Assessment: post-procedure vital signs reviewed and stable Respiratory status: spontaneous breathing, nonlabored ventilation, respiratory function stable and patient connected to nasal cannula oxygen Cardiovascular status: blood pressure returned to baseline and stable Postop Assessment: no apparent nausea or vomiting Anesthetic complications: no   No notable events documented.   Last Vitals:  Vitals:   06/06/21 0930 06/06/21 0937  BP: (!) 164/91 (!) 158/73  Pulse: 87 100  Resp: (!) 21 19  Temp:  37.2 C  SpO2: 98% 95%    Last Pain:  Vitals:   06/06/21 0937  TempSrc:   PainSc: 0-No pain                 Precious Haws Laporsche Hoeger

## 2021-06-06 NOTE — Anesthesia Procedure Notes (Signed)
Procedure Name: Intubation Date/Time: 06/06/2021 8:44 AM Performed by: Johnna Acosta, CRNA Pre-anesthesia Checklist: Patient identified, Emergency Drugs available, Suction available, Patient being monitored and Timeout performed Patient Re-evaluated:Patient Re-evaluated prior to induction Oxygen Delivery Method: Circle system utilized Preoxygenation: Pre-oxygenation with 100% oxygen Induction Type: IV induction Laryngoscope Size: McGraph and 3 Grade View: Grade I Tube type: Oral Tube size: 6.5 mm Number of attempts: 1 Airway Equipment and Method: Stylet and Video-laryngoscopy Placement Confirmation: ETT inserted through vocal cords under direct vision, positive ETCO2 and breath sounds checked- equal and bilateral Secured at: 17 cm Tube secured with: Tape Dental Injury: Teeth and Oropharynx as per pre-operative assessment

## 2021-06-06 NOTE — Discharge Summary (Signed)
Physician Discharge Summary   Patient: Anna Barrett MRN: [id number] DOB: 1923/04/27  Admit date:     06/05/2021  Discharge date: 06/07/21  Discharge Physician: Ezekiel Slocumb   PCP: Kirk Ruths, MD   Recommendations at discharge:    Follow up with Gi next week in clinic for repeat EGD and further testing Stay on liquid diet until GI follow up Follow up with Primary Care in 1-2 weeks Check BMP/CBC in 1-2 weeks   Discharge Diagnoses Principal Problem:   Esophageal stricture Active Problems:   A-fib (HCC)   CKD (chronic kidney disease) stage 3, GFR 30-59 ml/min (HCC)   Hypertension   Ischemic cardiomyopathy   Dysphagia   Resolved: Dysphagia   Hospital Course   88 y.o. female with medical history significant for asthma, atrial fibrillation, dyslipidemia, hypertension, ischemic cardiomyopathy and osteoporosis as well as esophageal stricture, who presented to the ER cough, occasional wheezing as well as dyspnea with regurgitation and vomiting of all p.o. intake including water.  This had gradually worsening over about one week.  No abdominal pain but when she eats she feels something is stuck in the middle of her chest.  She had an EGD a couple of years ago revealing esophageal stricture that was dilated.    Pt admitted for further evaluation of dysphagia. GI was consulted.  Patient taken for EGD on 06/06/21 which showed the lower third of the esophagus full of food that was removed.  EGD findings suspicious for achalasia.  No dilation could be safely performed due to the extensive food debris and resulting poor visualization.  Patient subsequently tolerating liquid diet.    Patient clinically improved, tolerating PO intake and to remain on liquid diet until outpatient follow up with GI next week.  Patient and family request discharge home today.  She is stable for discharge.          Consultants: GI Procedures performed: EGD  Disposition: Home Diet recommendation:  Clear liquid diet  DISCHARGE MEDICATION: Allergies as of 06/06/2021       Reactions   Atorvastatin Other (See Comments)   Iodine Other (See Comments)   Shellfish Allergy Other (See Comments)        Medication List     STOP taking these medications    predniSONE 20 MG tablet Commonly known as: DELTASONE       TAKE these medications    albuterol 108 (90 Base) MCG/ACT inhaler Commonly known as: VENTOLIN HFA Inhale 2 puffs into the lungs every 6 (six) hours as needed for wheezing or shortness of breath.   amoxicillin-clavulanate 875-125 MG tablet Commonly known as: Augmentin Take 1 tablet by mouth 2 (two) times daily for 7 days.   aspirin EC 325 MG tablet Take 1 tablet (325 mg total) by mouth daily.   azelastine 0.1 % nasal spray Commonly known as: ASTELIN Place 1 spray into both nostrils 2 (two) times daily. What changed:  how much to take how to take this when to take this   chlorpheniramine-HYDROcodone 10-8 MG/5ML Commonly known as: Tussionex Pennkinetic ER Take 2.5 mLs by mouth every 12 (twelve) hours as needed for cough.   Cholecalciferol 50 MCG (2000 UT) Tabs Take 2,000 Units by mouth daily.   cloNIDine 0.1 mg/24hr patch Commonly known as: CATAPRES - Dosed in mg/24 hr Place 0.1 mg onto the skin once a week.   feeding supplement Liqd Take 237 mLs by mouth 3 (three) times daily between meals.   ferrous sulfate 325 (65  FE) MG tablet Take 1 tablet (325 mg total) by mouth 2 (two) times daily with a meal.   ipratropium-albuterol 0.5-2.5 (3) MG/3ML Soln Commonly known as: DUONEB Take 3 mLs by nebulization every 4 (four) hours as needed for up to 20 days (SOB).         Discharge Exam: There were no vitals filed for this visit. General exam: awake, alert, no acute distress, frail HEENT: atraumatic, clear conjunctiva, anicteric sclera, moist mucus membranes, hearing grossly normal  Respiratory system: CTAB, no wheezes, rales or rhonchi, normal  respiratory effort. Cardiovascular system: normal S1/S2,  RRR Gastrointestinal system: soft, NT, ND, no HSM felt, +bowel sounds. Central nervous system: A&O x3. no gross focal neurologic deficits, normal speech Extremities: moves all , no edema, normal tone Skin: dry, intact, normal temperature Psychiatry: normal mood, congruent affect, judgement and insight appear normal   Condition at discharge: stable  The results of significant diagnostics from this hospitalization (including imaging, microbiology, ancillary and laboratory) are listed below for reference.   Imaging Studies: DG Chest 2 View  Result Date: 06/05/2021 CLINICAL DATA:  Shortness of breath, cough EXAM: CHEST - 2 VIEW COMPARISON:  Chest radiograph 06/02/2021 FINDINGS: The cardiomediastinal silhouette is stable. The lungs are hyperinflated with flattening of the diaphragms consistent with COPD. There is no focal consolidation or pulmonary edema. There is no pleural effusion or pneumothorax. There is no acute osseous abnormality. IMPRESSION: COPD. Otherwise, no radiographic evidence of acute cardiopulmonary process. Overall no significant interval change since 06/02/2021. Electronically Signed   By: Valetta Mole M.D.   On: 06/05/2021 10:51   DG Chest 2 View  Result Date: 06/02/2021 CLINICAL DATA:  Shortness of breath. EXAM: CHEST - 2 VIEW COMPARISON:  Chest radiograph dated 09/10/2020. FINDINGS: No focal consolidation, pleural effusion, pneumothorax. Mild cardiomegaly. Atherosclerotic calcification of the aorta. Osteopenia with degenerative changes of the spine. No acute osseous pathology. IMPRESSION: No active cardiopulmonary disease. Electronically Signed   By: Anner Crete M.D.   On: 06/02/2021 21:47    Microbiology: Results for orders placed or performed during the hospital encounter of 06/05/21  Resp Panel by RT-PCR (Flu A&B, Covid) Nasopharyngeal Swab     Status: None   Collection Time: 06/05/21 12:32 PM   Specimen:  Nasopharyngeal Swab; Nasopharyngeal(NP) swabs in vial transport medium  Result Value Ref Range Status   SARS Coronavirus 2 by RT PCR NEGATIVE NEGATIVE Final    Comment: (NOTE) SARS-CoV-2 target nucleic acids are NOT DETECTED.  The SARS-CoV-2 RNA is generally detectable in upper respiratory specimens during the acute phase of infection. The lowest concentration of SARS-CoV-2 viral copies this assay can detect is 138 copies/mL. A negative result does not preclude SARS-Cov-2 infection and should not be used as the sole basis for treatment or other patient management decisions. A negative result may occur with  improper specimen collection/handling, submission of specimen other than nasopharyngeal swab, presence of viral mutation(s) within the areas targeted by this assay, and inadequate number of viral copies(<138 copies/mL). A negative result must be combined with clinical observations, patient history, and epidemiological information. The expected result is Negative.  Fact Sheet for Patients:  EntrepreneurPulse.com.au  Fact Sheet for Healthcare Providers:  IncredibleEmployment.be  This test is no t yet approved or cleared by the [place] FDA and  has been authorized for detection and/or diagnosis of SARS-CoV-2 by FDA under an Emergency Use Authorization (EUA). This EUA will remain  in effect (meaning this test can be used) for the duration of the  COVID-19 declaration under Section 564(b)(1) of the Act, 21 U.S.C.section 360bbb-3(b)(1), unless the authorization is terminated  or revoked sooner.       Influenza A by PCR NEGATIVE NEGATIVE Final   Influenza B by PCR NEGATIVE NEGATIVE Final    Comment: (NOTE) The Xpert Xpress SARS-CoV-2/FLU/RSV plus assay is intended as an aid in the diagnosis of influenza from Nasopharyngeal swab specimens and should not be used as a sole basis for treatment. Nasal washings and aspirates are unacceptable for  Xpert Xpress SARS-CoV-2/FLU/RSV testing.  Fact Sheet for Patients: EntrepreneurPulse.com.au  Fact Sheet for Healthcare Providers: IncredibleEmployment.be  This test is not yet approved or cleared by the [place] FDA and has been authorized for detection and/or diagnosis of SARS-CoV-2 by FDA under an Emergency Use Authorization (EUA). This EUA will remain in effect (meaning this test can be used) for the duration of the COVID-19 declaration under Section 564(b)(1) of the Act, 21 U.S.C. section 360bbb-3(b)(1), unless the authorization is terminated or revoked.  Performed at [facility], [address] [place], [place] [address]     Labs: CBC: Recent Labs  Lab 06/02/21 2125 06/05/21 1010 06/06/21 0551  WBC 9.1 8.3 6.6  NEUTROABS 7.2  --   --   HGB 13.3 13.0 11.7*  HCT 40.5 39.8 35.9*  MCV 91.2 93.2 93.0  PLT 222 214 106   Basic Metabolic Panel: Recent Labs  Lab 06/02/21 2125 06/05/21 1010 06/06/21 0551  NA 139 145 144  K 4.3 3.9 3.7  CL 100 107 111  CO2 30 29 24   GLUCOSE 158* 132* 93  BUN 49* 49* 40*  CREATININE 1.12* 1.07* 0.85  CALCIUM 9.1 9.1 8.4*   Liver Function Tests: Recent Labs  Lab 06/02/21 2125  AST 13*  ALT 14  ALKPHOS 62  BILITOT 0.7  PROT 7.6  ALBUMIN 3.7   CBG: No results for input(s): GLUCAP in the last 168 hours.  Discharge time spent: greater than 30 minutes.  Signed: Ezekiel Slocumb, DO [facility] 06/07/2021

## 2021-06-07 ENCOUNTER — Encounter: Payer: Self-pay | Admitting: Gastroenterology

## 2021-06-07 DIAGNOSIS — R1310 Dysphagia, unspecified: Secondary | ICD-10-CM

## 2021-06-07 NOTE — Hospital Course (Signed)
88 y.o. female with medical history significant for asthma, atrial fibrillation, dyslipidemia, hypertension, ischemic cardiomyopathy and osteoporosis as well as esophageal stricture, who presented to the ER cough, occasional wheezing as well as dyspnea with regurgitation and vomiting of all p.o. intake including water.  This had gradually worsening over about one week.  No abdominal pain but when she eats she feels something is stuck in the middle of her chest.  She had an EGD a couple of years ago revealing esophageal stricture that was dilated.    Pt admitted for further evaluation of dysphagia. GI was consulted.  Patient taken for EGD on 06/06/21 which showed the lower third of the esophagus full of food that was removed.  EGD findings suspicious for achalasia.  No dilation could be safely performed due to the extensive food debris and resulting poor visualization.  Patient subsequently tolerating liquid diet.    Patient clinically improved, tolerating PO intake and to remain on liquid diet until outpatient follow up with GI next week.  Patient and family request discharge home today.  She is stable for discharge.

## 2021-06-13 DIAGNOSIS — R1319 Other dysphagia: Secondary | ICD-10-CM | POA: Diagnosis not present

## 2021-06-18 DIAGNOSIS — K2289 Other specified disease of esophagus: Secondary | ICD-10-CM | POA: Diagnosis not present

## 2021-06-18 DIAGNOSIS — Z8719 Personal history of other diseases of the digestive system: Secondary | ICD-10-CM | POA: Diagnosis not present

## 2021-06-18 DIAGNOSIS — R1319 Other dysphagia: Secondary | ICD-10-CM | POA: Diagnosis not present

## 2021-06-18 DIAGNOSIS — Z9889 Other specified postprocedural states: Secondary | ICD-10-CM | POA: Diagnosis not present

## 2021-06-18 DIAGNOSIS — K224 Dyskinesia of esophagus: Secondary | ICD-10-CM | POA: Diagnosis not present

## 2021-06-21 ENCOUNTER — Ambulatory Visit: Payer: PPO | Admitting: Anesthesiology

## 2021-06-21 ENCOUNTER — Encounter
Admission: RE | Disposition: A | Discharge status: Home / Self Care | Payer: Self-pay | Source: Home / Self Care | Attending: Internal Medicine

## 2021-06-21 ENCOUNTER — Encounter: Payer: Self-pay | Admitting: Internal Medicine

## 2021-06-21 ENCOUNTER — Ambulatory Visit
Admission: RE | Admit: 2021-06-21 | Discharge: 2021-06-21 | Disposition: A | Discharge status: Home / Self Care | Payer: PPO | Attending: Internal Medicine | Admitting: Internal Medicine

## 2021-06-21 DIAGNOSIS — R1314 Dysphagia, pharyngoesophageal phase: Secondary | ICD-10-CM | POA: Diagnosis not present

## 2021-06-21 DIAGNOSIS — K2289 Other specified disease of esophagus: Secondary | ICD-10-CM | POA: Diagnosis not present

## 2021-06-21 DIAGNOSIS — Z8719 Personal history of other diseases of the digestive system: Secondary | ICD-10-CM | POA: Diagnosis not present

## 2021-06-21 DIAGNOSIS — K219 Gastro-esophageal reflux disease without esophagitis: Secondary | ICD-10-CM | POA: Diagnosis not present

## 2021-06-21 DIAGNOSIS — I4891 Unspecified atrial fibrillation: Secondary | ICD-10-CM | POA: Diagnosis not present

## 2021-06-21 DIAGNOSIS — K224 Dyskinesia of esophagus: Secondary | ICD-10-CM | POA: Diagnosis not present

## 2021-06-21 DIAGNOSIS — K2100 Gastro-esophageal reflux disease with esophagitis, without bleeding: Secondary | ICD-10-CM | POA: Diagnosis not present

## 2021-06-21 DIAGNOSIS — Z9889 Other specified postprocedural states: Secondary | ICD-10-CM | POA: Diagnosis not present

## 2021-06-21 DIAGNOSIS — K222 Esophageal obstruction: Secondary | ICD-10-CM | POA: Diagnosis not present

## 2021-06-21 DIAGNOSIS — J45909 Unspecified asthma, uncomplicated: Secondary | ICD-10-CM | POA: Diagnosis not present

## 2021-06-21 DIAGNOSIS — K449 Diaphragmatic hernia without obstruction or gangrene: Secondary | ICD-10-CM | POA: Insufficient documentation

## 2021-06-21 HISTORY — PX: ESOPHAGOGASTRODUODENOSCOPY (EGD) WITH PROPOFOL: SHX5813

## 2021-06-21 SURGERY — ESOPHAGOGASTRODUODENOSCOPY (EGD) WITH PROPOFOL
Anesthesia: General

## 2021-06-21 MED ORDER — OXYMETAZOLINE HCL 0.05 % NA SOLN
NASAL | Status: AC
  Filled 2021-06-21: qty 30

## 2021-06-21 MED ORDER — PROPOFOL 500 MG/50ML IV EMUL
INTRAVENOUS | Status: AC
  Filled 2021-06-21: qty 50

## 2021-06-21 MED ORDER — PROPOFOL 500 MG/50ML IV EMUL
INTRAVENOUS | Status: DC | PRN
  Administered 2021-06-21: 150 ug/kg/min via INTRAVENOUS

## 2021-06-21 MED ORDER — SODIUM CHLORIDE 0.9 % IV SOLN
INTRAVENOUS | Status: DC
  Administered 2021-06-21: 1000 mL via INTRAVENOUS

## 2021-06-21 MED ORDER — PHENYLEPHRINE HCL (PRESSORS) 10 MG/ML IV SOLN
INTRAVENOUS | Status: DC | PRN
  Administered 2021-06-21: 160 ug via INTRAVENOUS

## 2021-06-21 MED ORDER — LIDOCAINE HCL (PF) 2 % IJ SOLN
INTRAMUSCULAR | Status: AC
  Filled 2021-06-21: qty 5

## 2021-06-21 MED ORDER — PROPOFOL 10 MG/ML IV BOLUS
INTRAVENOUS | Status: DC | PRN
Start: 2021-06-21 — End: 2021-06-21
  Administered 2021-06-21: 50 mg via INTRAVENOUS
  Administered 2021-06-21: 20 mg via INTRAVENOUS

## 2021-06-21 MED ORDER — LIDOCAINE 2% (20 MG/ML) 5 ML SYRINGE
INTRAMUSCULAR | Status: DC | PRN
  Administered 2021-06-21: 50 mg via INTRAVENOUS

## 2021-06-21 NOTE — Interval H&P Note (Signed)
History and Physical Interval Note:  06/21/2021 1:17 PM  Anna Barrett  has presented today for surgery, with the diagnosis of Z98.890  - S/P balloon dilatation of esophageal stricture R13.19  - Esophageal dysphagia Z87.19  - History of duodenal ulcer K22.89  - Presbyesophagus K22.4  - Esophageal motility disorder.  The various methods of treatment have been discussed with the patient and family. After consideration of risks, benefits and other options for treatment, the patient has consented to  Procedure(s): ESOPHAGOGASTRODUODENOSCOPY (EGD) WITH PROPOFOL (N/A) as a surgical intervention.  The patient's history has been reviewed, patient examined, no change in status, stable for surgery.  I have reviewed the patient's chart and labs.  Questions were answered to the patient's satisfaction.     [place], [place]

## 2021-06-21 NOTE — Anesthesia Postprocedure Evaluation (Signed)
Anesthesia Post Note  Patient: Anna Barrett  Procedure(s) Performed: ESOPHAGOGASTRODUODENOSCOPY (EGD) WITH PROPOFOL  Patient location during evaluation: Endoscopy Anesthesia Type: General Level of consciousness: awake and alert Pain management: pain level controlled Vital Signs Assessment: post-procedure vital signs reviewed and stable Respiratory status: spontaneous breathing, nonlabored ventilation, respiratory function stable and patient connected to nasal cannula oxygen Cardiovascular status: blood pressure returned to baseline and stable Postop Assessment: no apparent nausea or vomiting Anesthetic complications: no   No notable events documented.   Last Vitals:  Vitals:   06/21/21 1345 06/21/21 1355  BP: (!) 141/62 (!) 146/66  Pulse:    Resp: 20 20  Temp:    SpO2: 97% 98%    Last Pain:  Vitals:   06/21/21 1335  TempSrc: Temporal                 [name]

## 2021-06-21 NOTE — Op Note (Signed)
[facility] Patient Name: Anna Barrett Procedure Date: 06/21/2021 12:30 PM MRN: [id number] Account #: [id number] Date of Birth: [birth year] Admit Type: Outpatient Age: 88 Room: [facility] ENDO ROOM 1 Gender: Female Note Status: Finalized Instrument Name: Upper Endoscope [id number] Procedure:             Upper GI endoscopy Indications:           Esophageal dysphagia, For therapy of esophageal                         stenosis Providers:             Benay Pike. Alice Reichert MD, MD Referring MD:          Ocie Cornfield. Ouida Sills MD, MD (Referring MD) Medicines:             Propofol per Anesthesia Complications:         No immediate complications. Estimated blood loss:                         Minimal. Procedure:             Pre-Anesthesia Assessment:                        - The risks and benefits of the procedure and the                         sedation options and risks were discussed with the                         patient. All questions were answered and informed                         consent was obtained.                        - Patient identification and proposed procedure were                         verified prior to the procedure by the nurse. The                         procedure was verified in the procedure room.                        - ASA Grade Assessment: III - A patient with severe                         systemic disease.                        - After reviewing the risks and benefits, the patient                         was deemed in satisfactory condition to undergo the                         procedure.                        After obtaining informed consent,  the endoscope was                         passed under direct vision. Throughout the procedure,                         the patient's blood pressure, pulse, and oxygen                         saturations were monitored continuously. The Endoscope                         was introduced through  the mouth, and advanced to the                         third part of duodenum. The upper GI endoscopy was                         somewhat difficult due to unusual anatomy. The patient                         tolerated the procedure well. Findings:      Moderate tortuosity of the mid to distal esophagus was noted compatible       with a diagnosis of Presbyesophagus.      One benign-appearing, intrinsic moderate stenosis was found in the       distal esophagus. This stenosis measured 1.2 cm (inner diameter) x less       than one cm (in length). The stenosis was traversed. A TTS dilator was       passed through the scope. Dilation with a 15-16.5-18 mm balloon dilator       was performed to 16.5 mm. The dilation site was examined following       endoscope reinsertion and showed mild mucosal disruption. Estimated       blood loss was minimal. This was biopsied with a cold forceps for 4       quadrant distribution. Estimated blood loss was minimal.      A 3 cm hiatal hernia was present. Estimated blood loss was minimal.      The stomach was normal.      The examined duodenum was normal. Impression:            - Benign-appearing esophageal stenosis. Dilated.                         Biopsied.                        - 3 cm hiatal hernia.                        - Normal stomach.                        - Normal examined duodenum. Recommendation:        - Patient has a contact number available for                         emergencies. The signs and symptoms of potential  delayed complications were discussed with the patient.                         Return to normal activities tomorrow. Written                         discharge instructions were provided to the patient.                        - Mechanical soft diet.                        - Continue present medications.                        - Await pathology results.                        - Return to physician assistant in  6 weeks.                        - Follow up with Octavia Bruckner, PA-C in the GI office.                         [phone]                        - The findings and recommendations were discussed with                         the patient. Procedure Code(s):     --- Professional ---                        [phone], Esophagogastroduodenoscopy, flexible,                         transoral; with transendoscopic balloon dilation of                         esophagus (less than 30 mm diameter)                        43239, 59, Esophagogastroduodenoscopy, flexible,                         transoral; with biopsy, single or multiple Diagnosis Code(s):     --- Professional ---                        R13.14, Dysphagia, pharyngoesophageal phase                        K44.9, Diaphragmatic hernia without obstruction or                         gangrene                        K22.2, Esophageal obstruction CPT copyright 2019 American Medical Association. All rights reserved. The codes documented in this report are preliminary and upon coder review may  be revised to meet current compliance requirements. Efrain Sella MD,  MD 06/21/2021 1:37:17 PM This report has been signed electronically. Number of Addenda: 0 Note Initiated On: 06/21/2021 12:30 PM Estimated Blood Loss:  Estimated blood loss was minimal.      [facility]

## 2021-06-21 NOTE — Anesthesia Preprocedure Evaluation (Signed)
Anesthesia Evaluation  Patient identified by MRN, date of birth, ID band Patient awake    Reviewed: Allergy & Precautions, NPO status , Patient's Chart, lab work & pertinent test results  History of Anesthesia Complications Negative for: history of anesthetic complications  Airway Mallampati: III  TM Distance: <3 FB Neck ROM: full    Dental  (+) Chipped, Poor Dentition, Missing   Pulmonary shortness of breath, asthma , pneumonia, unresolved,    Pulmonary exam normal        Cardiovascular Exercise Tolerance: Good hypertension, (-) angina+ Past MI and + Peripheral Vascular Disease  Normal cardiovascular exam     Neuro/Psych  Headaches, negative psych ROS   GI/Hepatic negative GI ROS, Neg liver ROS, neg GERD  ,  Endo/Other  negative endocrine ROS  Renal/GU Renal disease  negative genitourinary   Musculoskeletal   Abdominal   Peds  Hematology negative hematology ROS (+)   Anesthesia Other Findings Patient is NPO appropriate and reports no nausea or vomiting today.  History of esophageal stricture.   Patient reports that she is currently able to swallow her saliva and does not feel like there is anything stuck in her throat at this time.  Past Medical History: No date: Asthma No date: Carotid stenosis No date: Headache     Comment:  MIGRAINES No date: History of MI (myocardial infarction) No date: Hyperglycemia No date: Hyperlipidemia No date: Hypertension No date: Ischemic cardiomyopathy 1962: Myocardial infarction ([place]) No date: Osteoporosis No date: Parathyroid adenoma  Past Surgical History: No date: ABDOMINAL HYSTERECTOMY No date: APPENDECTOMY No date: CATARACT EXTRACTION; Bilateral 04/10/2020: ESOPHAGOGASTRODUODENOSCOPY (EGD) WITH PROPOFOL; N/A     Comment:  Procedure: ESOPHAGOGASTRODUODENOSCOPY (EGD) WITH               PROPOFOL;  Surgeon: [place], Benay Pike, MD;  Location:               [facility]  ENDOSCOPY;  Service: Gastroenterology;  Laterality:               N/A; No date: EYE SURGERY No date: FRACTURE SURGERY 04/17/2015: ORIF ANKLE FRACTURE; Left     Comment:  Procedure: OPEN REDUCTION INTERNAL FIXATION (ORIF) ANKLE              FRACTURE;  Surgeon: Corky Mull, MD;  Location: [facility]               ORS;  Service: Orthopedics;  Laterality: Left;  BMI    Body Mass Index: 16.82 kg/m      Reproductive/Obstetrics negative OB ROS                             Anesthesia Physical  Anesthesia Plan  ASA: 3  Anesthesia Plan: General   Post-op Pain Management:    Induction: Intravenous  PONV Risk Score and Plan: Propofol infusion and TIVA  Airway Management Planned: Natural Airway and Nasal Cannula  Additional Equipment:   Intra-op Plan:   Post-operative Plan:   Informed Consent: I have reviewed the patients History and Physical, chart, labs and discussed the procedure including the risks, benefits and alternatives for the proposed anesthesia with the patient or authorized representative who has indicated his/her understanding and acceptance.     Dental Advisory Given  Plan Discussed with: Anesthesiologist, CRNA and Surgeon  Anesthesia Plan Comments: (Patient consented for risks of anesthesia including but not limited to:  - adverse reactions to medications - risk of airway  placement if required - damage to eyes, teeth, lips or other oral mucosa - nerve damage due to positioning  - sore throat or hoarseness - Damage to heart, brain, nerves, lungs, other parts of body or loss of life  Patient voiced understanding.)        Anesthesia Quick Evaluation

## 2021-06-21 NOTE — H&P (Signed)
Outpatient short stay form Pre-procedure 06/21/2021 1:16 PM Herman Mell K. Alice Reichert, M.D.  Primary Physician: Frazier Richards, M.D.  Reason for visit:  Dysphagia, hx of esophageal stricture  History of present illness:  Ms. Flesch is established with [facility] for the management of distal esophageal stricture, presbyesophagus. All prior office notes, lab results, imaging studies, and procedure reports reviewed as appropriate.  Summary of evaluation:  - BaS: 04/23/2017 - distal esophageal stricture just proximal to a small partially reducible hiatal hernia, presbyesophagus - BaS: 03/02/2020 - distal esophageal stricture with delay in passage of standard barium tablet noted, small sliding hiatal hernia, laryngeal penetration with mild aspiration, tertiary esophageal contractions with slow passage of barium c/w presbyesophagus - EGD: 04/10/2020 - one benign-appearing, intrinsic moderate stenosis found in distal esophagus measuring 1.3 cm (inner diameter) x <1cm (in length) s/p dilatation with TTS dilator to 16.5 mm, 3 cm hiatal hernia, gastritis, many non-bleeding superficial duodenal ulcers with no stigmata of bleeding found in duodenal bulb - EGD: 06/06/2021 - food in lower third of esophagus s/p removal, lumen of lower third of esophagus was severely dilated, no appreciable esophageal motility noted, hypertonic LES found with mild resistance to endoscope advancement into stomach, one benign-appearing, intrinsic esophageal stricture at GEJ (no dilatation performed), medium-sized hiatal hernia, normal duodenm  GI Medications: Current: omeprazole 10 mg PO qd    Current Facility-Administered Medications:    0.9 %  sodium chloride infusion, , Intravenous, Continuous, Rheanna Sergent K, MD, Last Rate: 20 mL/hr at 06/21/21 1314, Continued from Pre-op at 06/21/21 1314  Medications Prior to Admission  Medication Sig Dispense Refill Last Dose   aspirin EC 325 MG tablet Take 1  tablet (325 mg total) by mouth daily. 30 tablet 0 Past Week   azelastine (ASTELIN) 0.1 % nasal spray Place 1 spray into both nostrils 2 (two) times daily. 30 mL 12 Past Week   Cholecalciferol 50 MCG (2000 UT) TABS Take 2,000 Units by mouth daily.   Past Week   cloNIDine (CATAPRES - DOSED IN MG/24 HR) 0.1 mg/24hr patch Place 0.1 mg onto the skin once a week.   Past Week   feeding supplement (ENSURE ENLIVE / ENSURE PLUS) LIQD Take 237 mLs by mouth 3 (three) times daily between meals. 237 mL 12 Past Week   nystatin (MYCOSTATIN) 100000 UNIT/ML suspension Take 5 mLs by mouth 4 (four) times daily.   06/20/2021   omeprazole (PRILOSEC) 10 MG capsule Take 10 mg by mouth daily.   Past Week   albuterol (VENTOLIN HFA) 108 (90 Base) MCG/ACT inhaler Inhale 2 puffs into the lungs every 6 (six) hours as needed for wheezing or shortness of breath. 8 g 2    chlorpheniramine-HYDROcodone (TUSSIONEX PENNKINETIC ER) 10-8 MG/5ML Take 2.5 mLs by mouth every 12 (twelve) hours as needed for cough. 70 mL 0    ferrous sulfate 325 (65 FE) MG tablet Take 1 tablet (325 mg total) by mouth 2 (two) times daily with a meal. 60 tablet 2    ipratropium-albuterol (DUONEB) 0.5-2.5 (3) MG/3ML SOLN Take 3 mLs by nebulization every 4 (four) hours as needed for up to 20 days (SOB). 360 mL 0      Allergies  Allergen Reactions   Atorvastatin Other (See Comments)   Iodine Other (See Comments)   Shellfish Allergy Other (See Comments)     Past Medical History:  Diagnosis Date   Asthma    Carotid stenosis    Headache    MIGRAINES   History of MI (  myocardial infarction)    Hyperglycemia    Hyperlipidemia    Hypertension    Ischemic cardiomyopathy    Myocardial infarction [facility]) 1962   Osteoporosis    Parathyroid adenoma     Review of systems:  Otherwise negative.    Physical Exam  Gen: Alert, oriented. Appears stated age.  HEENT: [place]/AT. PERRLA. Lungs: CTA, no wheezes. CV: RR nl S1, S2. Abd: soft, benign, no masses.  BS+ Ext: No edema. Pulses 2+    Planned procedures: Proceed with EGD.The patient understands the nature of the planned procedure, indications, risks, alternatives and potential complications including but not limited to bleeding, infection, perforation, damage to internal organs and possible oversedation/side effects from anesthesia. The patient agrees and gives consent to proceed.  Please refer to procedure notes for findings, recommendations and patient disposition/instructions.     Amylia Collazos K. Alice Reichert, M.D. Gastroenterology 06/21/2021  1:16 PM

## 2021-06-21 NOTE — Transfer of Care (Signed)
Immediate Anesthesia Transfer of Care Note  Patient: Anna Barrett  Procedure(s) Performed: ESOPHAGOGASTRODUODENOSCOPY (EGD) WITH PROPOFOL  Patient Location: Endoscopy Unit  Anesthesia Type:General  Level of Consciousness: sedated  Airway & Oxygen Therapy: Patient Spontanous Breathing  Post-op Assessment: Report given to RN and Post -op Vital signs reviewed and stable  Post vital signs: Reviewed and stable  Last Vitals:  Vitals Value Taken Time  BP 85/49 06/21/21 1335  Temp 36.8 C 06/21/21 1335  Pulse 86 06/21/21 1335  Resp 16 06/21/21 1335  SpO2 97 % 06/21/21 1335    Last Pain:  Vitals:   06/21/21 1335  TempSrc: Temporal      Patients Stated Pain Goal: 0 (83/72/90 2111)  Complications: No notable events documented.

## 2021-06-22 ENCOUNTER — Encounter: Payer: Self-pay | Admitting: Internal Medicine

## 2021-06-25 LAB — SURGICAL PATHOLOGY

## 2021-08-23 DIAGNOSIS — M791 Myalgia, unspecified site: Secondary | ICD-10-CM | POA: Diagnosis not present

## 2021-08-23 DIAGNOSIS — N1831 Chronic kidney disease, stage 3a: Secondary | ICD-10-CM | POA: Diagnosis not present

## 2021-08-23 DIAGNOSIS — I480 Paroxysmal atrial fibrillation: Secondary | ICD-10-CM | POA: Diagnosis not present

## 2021-08-23 DIAGNOSIS — I255 Ischemic cardiomyopathy: Secondary | ICD-10-CM | POA: Diagnosis not present

## 2021-08-23 DIAGNOSIS — T466X5A Adverse effect of antihyperlipidemic and antiarteriosclerotic drugs, initial encounter: Secondary | ICD-10-CM | POA: Diagnosis not present

## 2021-08-23 DIAGNOSIS — R7303 Prediabetes: Secondary | ICD-10-CM | POA: Diagnosis not present

## 2021-08-23 DIAGNOSIS — I10 Essential (primary) hypertension: Secondary | ICD-10-CM | POA: Diagnosis not present

## 2021-08-23 DIAGNOSIS — I700 Atherosclerosis of aorta: Secondary | ICD-10-CM | POA: Diagnosis not present

## 2021-08-23 DIAGNOSIS — I779 Disorder of arteries and arterioles, unspecified: Secondary | ICD-10-CM | POA: Diagnosis not present

## 2021-09-07 DIAGNOSIS — L821 Other seborrheic keratosis: Secondary | ICD-10-CM | POA: Diagnosis not present

## 2021-09-07 DIAGNOSIS — L905 Scar conditions and fibrosis of skin: Secondary | ICD-10-CM | POA: Diagnosis not present

## 2021-09-07 DIAGNOSIS — Z859 Personal history of malignant neoplasm, unspecified: Secondary | ICD-10-CM | POA: Diagnosis not present

## 2021-09-22 DIAGNOSIS — J019 Acute sinusitis, unspecified: Secondary | ICD-10-CM | POA: Diagnosis not present

## 2021-09-22 DIAGNOSIS — B9689 Other specified bacterial agents as the cause of diseases classified elsewhere: Secondary | ICD-10-CM | POA: Diagnosis not present

## 2021-09-22 DIAGNOSIS — R059 Cough, unspecified: Secondary | ICD-10-CM | POA: Diagnosis not present

## 2021-09-22 DIAGNOSIS — R058 Other specified cough: Secondary | ICD-10-CM | POA: Diagnosis not present

## 2021-11-02 DIAGNOSIS — L821 Other seborrheic keratosis: Secondary | ICD-10-CM | POA: Diagnosis not present

## 2021-11-02 DIAGNOSIS — Z859 Personal history of malignant neoplasm, unspecified: Secondary | ICD-10-CM | POA: Diagnosis not present

## 2021-11-02 DIAGNOSIS — L578 Other skin changes due to chronic exposure to nonionizing radiation: Secondary | ICD-10-CM | POA: Diagnosis not present

## 2021-11-02 DIAGNOSIS — L570 Actinic keratosis: Secondary | ICD-10-CM | POA: Diagnosis not present

## 2021-11-02 DIAGNOSIS — D0439 Carcinoma in situ of skin of other parts of face: Secondary | ICD-10-CM | POA: Diagnosis not present

## 2021-11-02 DIAGNOSIS — D485 Neoplasm of uncertain behavior of skin: Secondary | ICD-10-CM | POA: Diagnosis not present

## 2021-12-25 DIAGNOSIS — Z9989 Dependence on other enabling machines and devices: Secondary | ICD-10-CM | POA: Diagnosis not present

## 2021-12-25 DIAGNOSIS — D692 Other nonthrombocytopenic purpura: Secondary | ICD-10-CM | POA: Diagnosis not present

## 2021-12-25 DIAGNOSIS — N1831 Chronic kidney disease, stage 3a: Secondary | ICD-10-CM | POA: Diagnosis not present

## 2021-12-25 DIAGNOSIS — I255 Ischemic cardiomyopathy: Secondary | ICD-10-CM | POA: Diagnosis not present

## 2021-12-25 DIAGNOSIS — I10 Essential (primary) hypertension: Secondary | ICD-10-CM | POA: Diagnosis not present

## 2021-12-25 DIAGNOSIS — R7303 Prediabetes: Secondary | ICD-10-CM | POA: Diagnosis not present

## 2021-12-25 DIAGNOSIS — I480 Paroxysmal atrial fibrillation: Secondary | ICD-10-CM | POA: Diagnosis not present

## 2021-12-25 DIAGNOSIS — Z0000 Encounter for general adult medical examination without abnormal findings: Secondary | ICD-10-CM | POA: Diagnosis not present

## 2021-12-25 DIAGNOSIS — T466X5A Adverse effect of antihyperlipidemic and antiarteriosclerotic drugs, initial encounter: Secondary | ICD-10-CM | POA: Diagnosis not present

## 2021-12-25 DIAGNOSIS — I779 Disorder of arteries and arterioles, unspecified: Secondary | ICD-10-CM | POA: Diagnosis not present

## 2021-12-25 DIAGNOSIS — I700 Atherosclerosis of aorta: Secondary | ICD-10-CM | POA: Diagnosis not present

## 2021-12-25 DIAGNOSIS — M791 Myalgia, unspecified site: Secondary | ICD-10-CM | POA: Diagnosis not present

## 2021-12-26 DIAGNOSIS — D0439 Carcinoma in situ of skin of other parts of face: Secondary | ICD-10-CM | POA: Diagnosis not present

## 2022-02-16 DIAGNOSIS — J069 Acute upper respiratory infection, unspecified: Secondary | ICD-10-CM | POA: Diagnosis not present

## 2022-04-26 IMAGING — RF DG ESOPHAGUS
10 of 12 series · 14 of 19 positions shown · non-contrast
Comparison: 04/23/2017.

CLINICAL DATA: Dysphagia.  Choking on food.

EXAM:
ESOPHOGRAM / BARIUM SWALLOW / BARIUM TABLET STUDY
TECHNIQUE: Combined double contrast and single contrast examination performed
using effervescent crystals, thick barium liquid, and thin barium
liquid. The patient was observed with fluoroscopy swallowing a 13 mm
barium sulphate tablet.
FLUOROSCOPY TIME:  Fluoroscopy Time:  3 minutes 0 seconds
Radiation Exposure Index (if provided by the fluoroscopic device):
30.7 mGy

[Series 1: fluoro_barium 2fps_bw · 0.17mm/px · 3 of 13 frames shown (1 of 10)]
[frame 2/13]
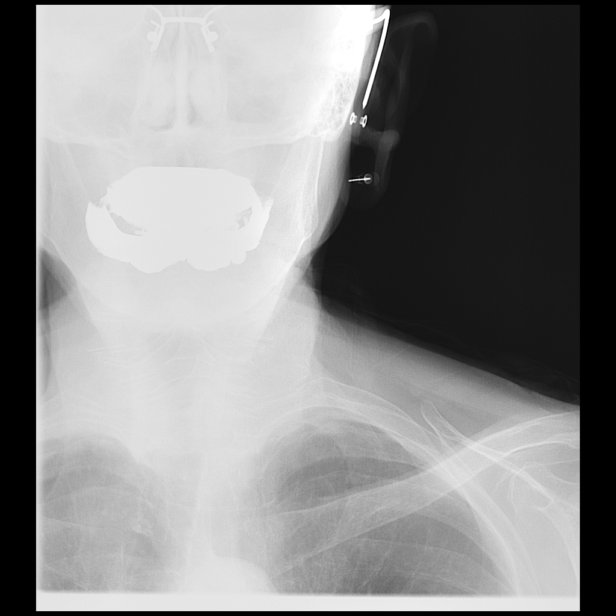
[frame 12/13]
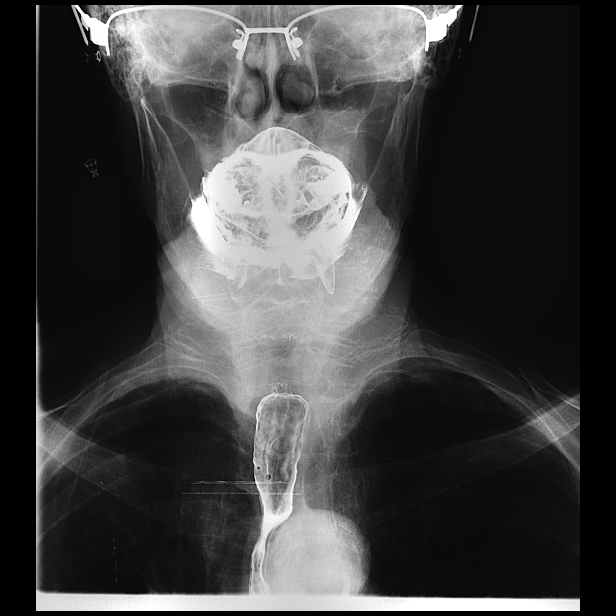
[frame 13/13]
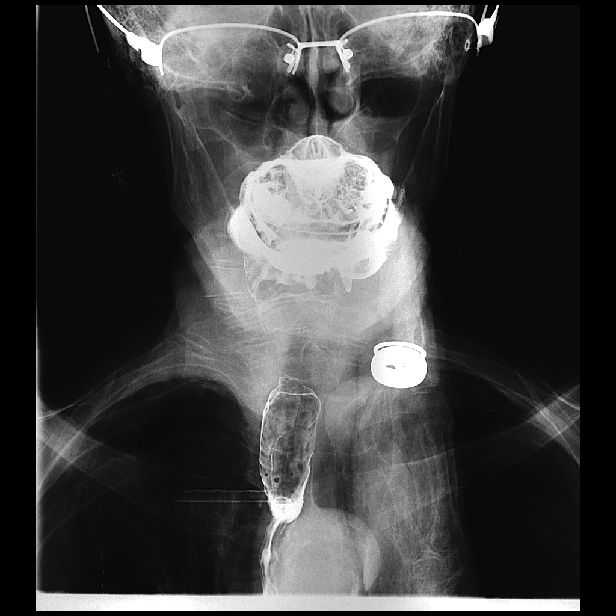

[Series 2: fluoro_barium 2fps_bw · 0.17mm/px · 3 of 11 frames shown (2 of 10)]
[frame 2/11]
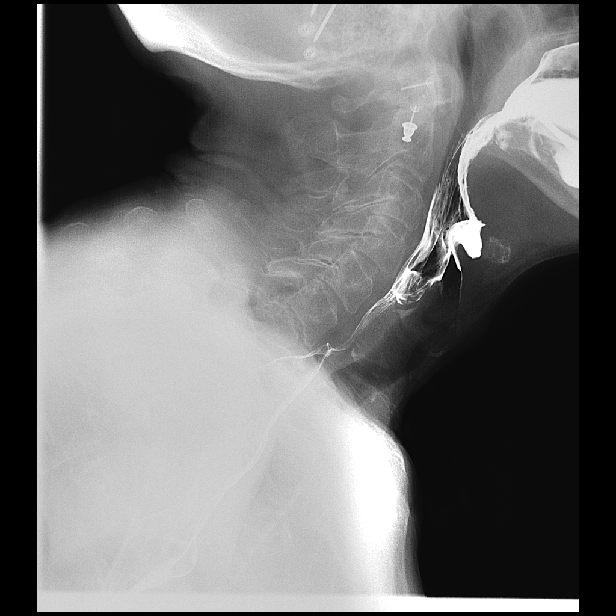
[frame 6/11]
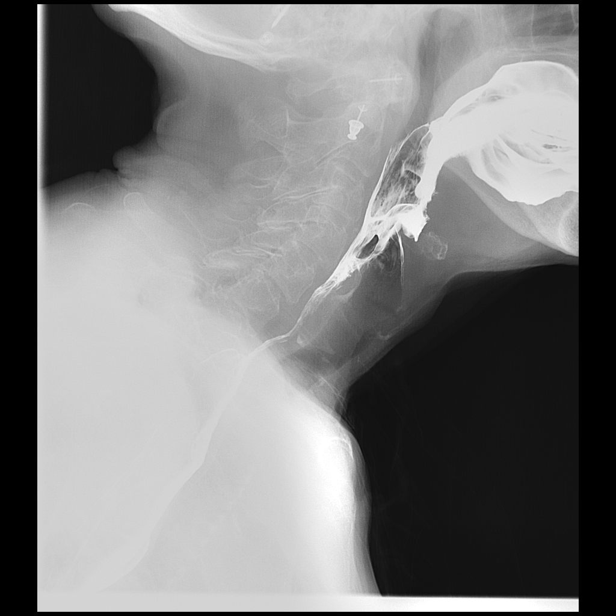
[frame 10/11]
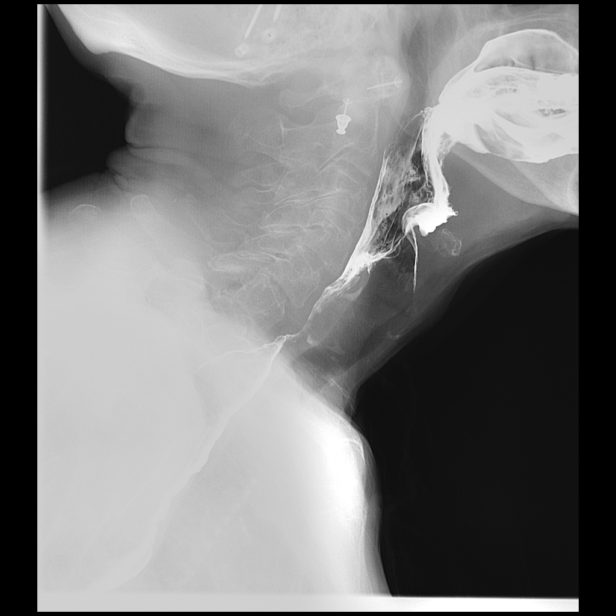

[Series 3: fluoro_barium 2fps_bw · 0.17mm/px · 1 of 1 slices shown (3 of 10)]
[im 1/1]
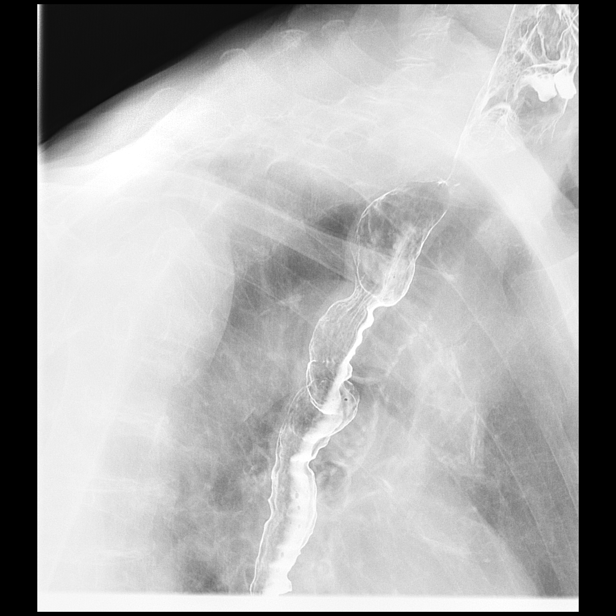

[Series 4: fluoro_barium 2fps_bw · 0.17mm/px · 1 of 2 frames shown (4 of 10)]
[frame 2/2]
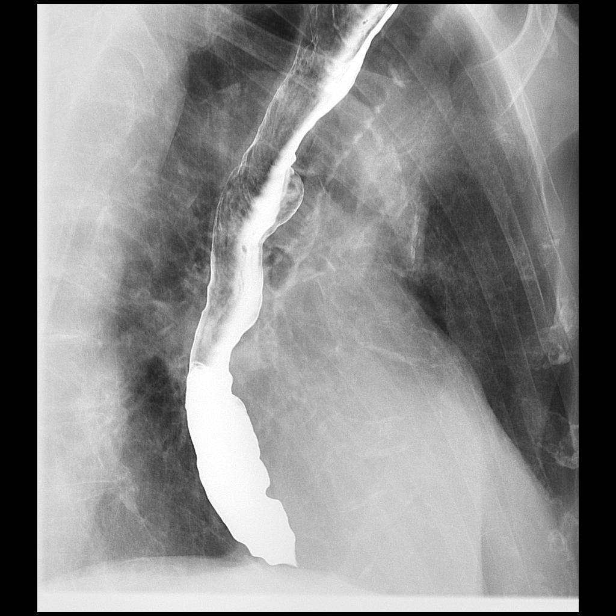

[Series 5: fluoro_barium 2fps_bw · 0.17mm/px · 1 of 1 slices shown (5 of 10)]
[im 1/1]
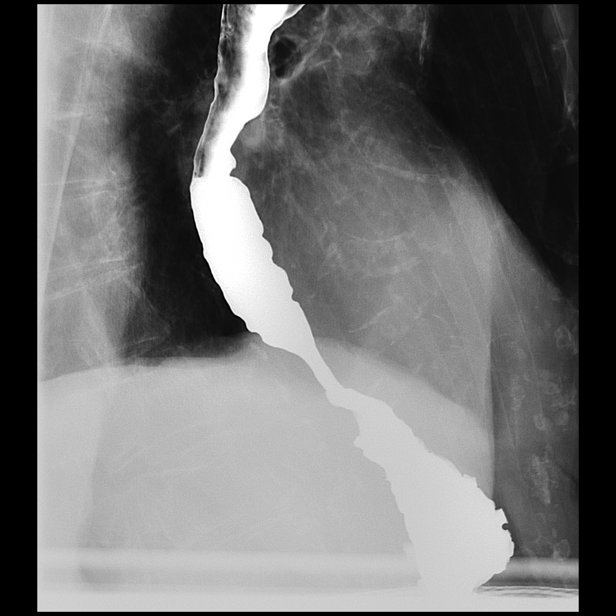

[Series 6: fluoro_barium 2fps_bw · 0.17mm/px · 1 of 1 slices shown (6 of 10)]
[im 1/1]
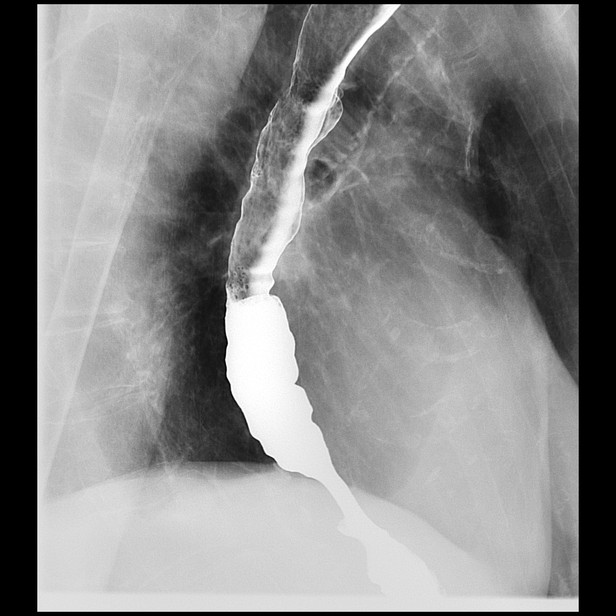

[Series 8: fluoro_barium 2fps_bw · 0.18mm/px · 1 of 1 slices shown (7 of 10)]
[im 1/1]
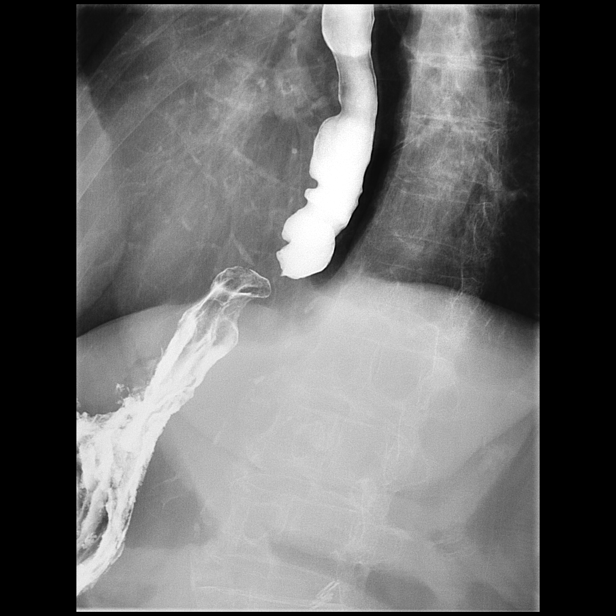

[Series 9: fluoro_barium 2fps_bw · 0.18mm/px · 1 of 1 slices shown (8 of 10)]
[im 1/1]
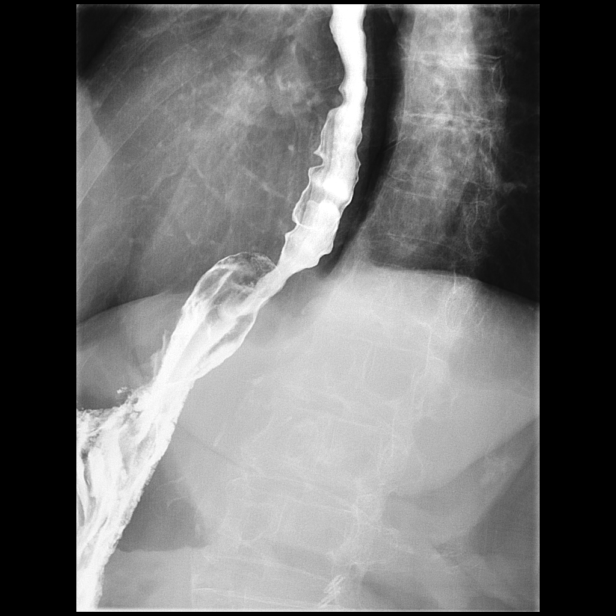

[Series 10: fluoro_barium 2fps_bw · 0.18mm/px · 1 of 1 slices shown (9 of 10)]
[im 1/1]
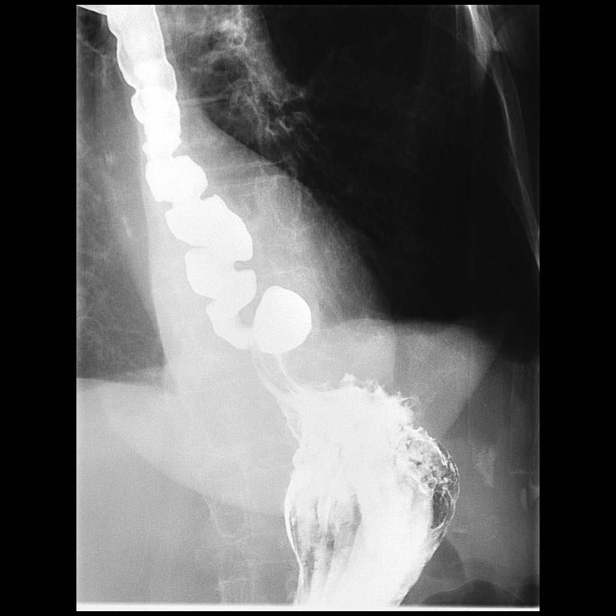

[Series 12: fluoro_barium 2fps_bw · 0.18mm/px · 1 of 1 slices shown (10 of 10)]
[im 1/1]
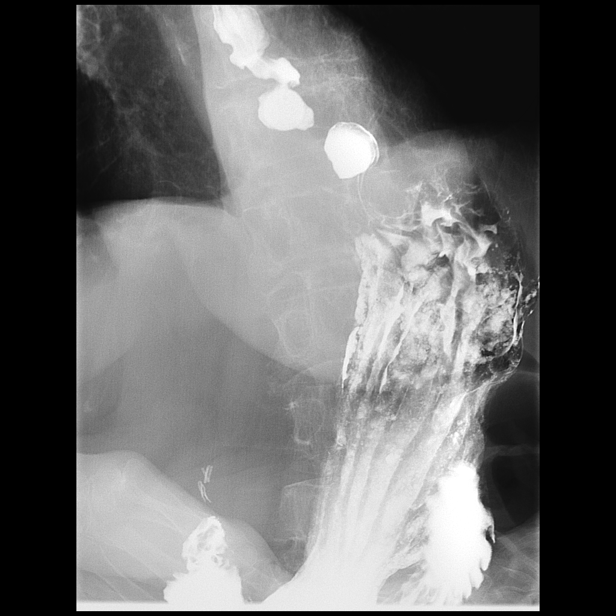

[14 of 19 positions shown; findings below may reference images not displayed]

FINDINGS: Cervical esophagus is widely patent. Laryngeal penetration with mild
aspiration noted. Tertiary esophageal contractions with slow passage
of barium noted consistent presbyesophagus. Distal esophageal
stricture with delay in passage of a standard barium tablet again
noted. Similar finding noted on prior exam. Small sliding hiatal
hernia noted. No prominent reflux noted.
IMPRESSION: 1.  Laryngeal penetration with mild aspiration.

2. Tertiary esophageal contractions with slow passage of barium
consistent with presbyesophagus.

3. Distal esophageal stricture with delay in passage of a standard
barium tablet again noted. Similar finding noted on prior exam.

4.  Small sliding hiatal hernia noted.  No prominent reflux noted.

## 2022-04-30 DIAGNOSIS — D485 Neoplasm of uncertain behavior of skin: Secondary | ICD-10-CM | POA: Diagnosis not present

## 2022-04-30 DIAGNOSIS — C4442 Squamous cell carcinoma of skin of scalp and neck: Secondary | ICD-10-CM | POA: Diagnosis not present

## 2022-05-16 DIAGNOSIS — C4442 Squamous cell carcinoma of skin of scalp and neck: Secondary | ICD-10-CM | POA: Diagnosis not present

## 2022-05-17 ENCOUNTER — Other Ambulatory Visit: Payer: Self-pay

## 2022-05-17 ENCOUNTER — Emergency Department: Payer: PPO

## 2022-05-17 DIAGNOSIS — R197 Diarrhea, unspecified: Secondary | ICD-10-CM | POA: Insufficient documentation

## 2022-05-17 DIAGNOSIS — R079 Chest pain, unspecified: Secondary | ICD-10-CM | POA: Diagnosis not present

## 2022-05-17 DIAGNOSIS — I10 Essential (primary) hypertension: Secondary | ICD-10-CM | POA: Diagnosis not present

## 2022-05-17 DIAGNOSIS — J449 Chronic obstructive pulmonary disease, unspecified: Secondary | ICD-10-CM | POA: Diagnosis not present

## 2022-05-17 LAB — CBC WITH DIFFERENTIAL/PLATELET
Abs Immature Granulocytes: 0.06 10*3/uL (ref 0.00–0.07)
Basophils Absolute: 0.1 10*3/uL (ref 0.0–0.1)
Basophils Relative: 1 %
Eosinophils Absolute: 0 10*3/uL (ref 0.0–0.5)
Eosinophils Relative: 0 %
HCT: 41.1 % (ref 36.0–46.0)
Hemoglobin: 13.4 g/dL (ref 12.0–15.0)
Immature Granulocytes: 1 %
Lymphocytes Relative: 5 %
Lymphs Abs: 0.6 10*3/uL — ABNORMAL LOW (ref 0.7–4.0)
MCH: 30.5 pg (ref 26.0–34.0)
MCHC: 32.6 g/dL (ref 30.0–36.0)
MCV: 93.4 fL (ref 80.0–100.0)
Monocytes Absolute: 0.8 10*3/uL (ref 0.1–1.0)
Monocytes Relative: 7 %
Neutro Abs: 10.7 10*3/uL — ABNORMAL HIGH (ref 1.7–7.7)
Neutrophils Relative %: 86 %
Platelets: 203 10*3/uL (ref 150–400)
RBC: 4.4 MIL/uL (ref 3.87–5.11)
RDW: 12.4 % (ref 11.5–15.5)
WBC: 12.3 10*3/uL — ABNORMAL HIGH (ref 4.0–10.5)
nRBC: 0 % (ref 0.0–0.2)

## 2022-05-17 LAB — COMPREHENSIVE METABOLIC PANEL
ALT: 12 U/L (ref 0–44)
AST: 18 U/L (ref 15–41)
Albumin: 3.6 g/dL (ref 3.5–5.0)
Alkaline Phosphatase: 53 U/L (ref 38–126)
Anion gap: 8 (ref 5–15)
BUN: 33 mg/dL — ABNORMAL HIGH (ref 8–23)
CO2: 31 mmol/L (ref 22–32)
Calcium: 9.2 mg/dL (ref 8.9–10.3)
Chloride: 99 mmol/L (ref 98–111)
Creatinine, Ser: 1.1 mg/dL — ABNORMAL HIGH (ref 0.44–1.00)
GFR, Estimated: 46 mL/min — ABNORMAL LOW (ref >60)
Glucose, Bld: 155 mg/dL — ABNORMAL HIGH (ref 70–99)
Potassium: 5.9 mmol/L — ABNORMAL HIGH (ref 3.5–5.1)
Sodium: 138 mmol/L (ref 135–145)
Total Bilirubin: 1.1 mg/dL (ref 0.3–1.2)
Total Protein: 7.2 g/dL (ref 6.5–8.1)

## 2022-05-17 LAB — GASTROINTESTINAL PANEL BY PCR, STOOL (REPLACES STOOL CULTURE)

## 2022-05-17 LAB — TROPONIN I (HIGH SENSITIVITY): Troponin I (High Sensitivity): 7 ng/L (ref <18)

## 2022-05-17 NOTE — ED Triage Notes (Signed)
Pt c/o diarrhea that started approx 1pm today. Pt's son is with the pt and sts he was called at 4pm and was notified. Pt appears in no obvious distress at this time. Son now informing this RN that she has also been having CP and that they told EMS. Pt does have feces on her at this time.

## 2022-05-17 NOTE — ED Notes (Signed)
Pt cleaned up of all feces and placed in new depends.

## 2022-05-17 NOTE — ED Triage Notes (Signed)
Pt comes via EMs with c/o watery diarrhea. Pt has hemorrhoids also. Pt states this all started today. 109/74 96 % HR-90 T-97.4

## 2022-05-18 ENCOUNTER — Emergency Department
Admission: EM | Admit: 2022-05-18 | Discharge: 2022-05-18 | Disposition: A | Discharge status: Home / Self Care | Payer: PPO | Attending: Emergency Medicine | Admitting: Emergency Medicine

## 2022-05-18 DIAGNOSIS — R197 Diarrhea, unspecified: Secondary | ICD-10-CM

## 2022-05-18 LAB — C DIFFICILE QUICK SCREEN W PCR REFLEX
C Diff antigen: NEGATIVE
C Diff interpretation: NOT DETECTED
C Diff toxin: NEGATIVE

## 2022-05-18 MED ORDER — ONDANSETRON 4 MG PO TBDP: ORAL_TABLET | ORAL | 0 refills | Status: AC

## 2022-05-18 NOTE — Discharge Instructions (Signed)
As we discussed, and spite of your diarrhea, we were not able to identify a specific cause.  Your stool tested negative for all the gastrointestinal pathogens for which we can test.  You should read through the information about oral rehydration and drink plenty of fluids, including Pedialyte or Gatorade or something with electrolytes.  Please follow-up with your PCP at the next available opportunity.  If you need to take a dose of Imodium or similar antidiarrheal medicine, do so, but only take 1 dose because it can have a long-lasting effect.  Return to the emergency department if you develop new or worsening symptoms that concern you.

## 2022-05-18 NOTE — ED Provider Notes (Addendum)
[facility] Provider Note    Event Date/Time   First MD Initiated Contact with Patient 05/18/22 0142     (approximate)   History   Diarrhea   HPI  Anna Barrett is a 89 y.o. female who is surprisingly healthy for her age, relatively independent and active, and presents with her son for evaluation of diarrhea.  The diarrhea started about 1 PM yesterday (more than 12 hours ago at this point) and has been copious.  He described it as "liquid mud" that we will keep coming out even as they are trying to get her cleaned up.  She denies having any pain although she said that when it was at its worst, she was feeling a bit nauseated and having some dry heaves and it was at that time that she was also having some pain in her chest.  She had no shortness of breath.  She said that she feels much better at this point even though she is still having some of the diarrhea.  She lives with her great granddaughter and other family nearby, and no one else has been ill recently.  In fact about a week ago she made a road trip down to [place] with her son.  She also recently had a dermatological procedure on her right clavicle.  She currently said she is getting irritated because she wants to go home and she has been waiting so long.  She is tolerating oral intake.     Physical Exam   Triage Vital Signs: ED Triage Vitals [05/17/22 1945]  Enc Vitals Group     BP (!) 137/57     Pulse Rate 86     Resp 18     Temp (!) 97.5 F (36.4 C)     Temp Source Oral     SpO2 96 %     Weight      Height      Head Circumference      Peak Flow      Pain Score 0     Pain Loc      Pain Edu?      Excl. in [place]?     Most recent vital signs: Vitals:   05/18/22 0148 05/18/22 0230  BP: (!) 154/92 134/62  Pulse: 90 100  Resp: 18 18  Temp:    SpO2: 97% 94%     General: Awake, no distress.  Appears younger than chronological age. CV:  Good peripheral perfusion.  Regular rate and  rhythm Resp:  Normal effort.  No accessory muscle usage or intercostal retractions.  Good air movement, speaking easily and comfortably. Abd:  No distention.  No tenderness to palpation at all.  No rebound or guarding. Other:  Awake, alert, pleasant, conversational, oriented, very appropriate and well-appearing.   ED Results / Procedures / Treatments   Labs (all labs ordered are listed, but only abnormal results are displayed) Labs Reviewed  CBC WITH DIFFERENTIAL/PLATELET - Abnormal; Notable for the following components:      Result Value   WBC 12.3 (*)    Neutro Abs 10.7 (*)    Lymphs Abs 0.6 (*)    All other components within normal limits  COMPREHENSIVE METABOLIC PANEL - Abnormal; Notable for the following components:   Potassium 5.9 (*)    Glucose, Bld 155 (*)    BUN 33 (*)    Creatinine, Ser 1.10 (*)    GFR, Estimated 46 (*)    All other components  within normal limits  GASTROINTESTINAL PANEL BY PCR, STOOL (REPLACES STOOL CULTURE)  C DIFFICILE QUICK SCREEN W PCR REFLEX    TROPONIN I (HIGH SENSITIVITY)  TROPONIN I (HIGH SENSITIVITY)     EKG  ED ECG REPORT I, Hinda Kehr, the attending physician, personally viewed and interpreted this ECG.  Date: 05/17/2022 EKG Time: 19: 47 Rate: 87 Rhythm: normal sinus rhythm QRS Axis: normal Intervals: Left bundle branch block ST/T Wave abnormalities: Non-specific ST segment / T-wave changes, but no clear evidence of acute ischemia. Narrative Interpretation: no definitive evidence of acute ischemia; does not meet STEMI criteria.    RADIOLOGY I viewed and interpreted the patient's chest x-ray.  No acute abnormalities.  Some chronic scarring.  Radiology report agrees.    PROCEDURES:  Critical Care performed: No  Procedures   MEDICATIONS ORDERED IN ED: Medications - No data to display   IMPRESSION / MDM / [place] / ED COURSE  I reviewed the triage vital signs and the nursing notes.                               Differential diagnosis includes, but is not limited to, viral GI pathogen, bacterial GI pathogen, megacolon, SBO/ileus with bowel incontinence, diverticulitis, C. difficile.  Patient's presentation is most consistent with acute presentation with potential threat to life or bodily function.  Lab/studies ordered from triage: High-sensitivity troponin, CMP, CBC with differential, GI pathogen panel, C. difficile quick screen with PCR reflex, EKG.  Patient has been stable for nearly 9 hours in the emergency department.  She is still having some diarrhea but is tolerating oral intake and not having any additional vomiting.  Her vital signs are stable and within normal limits other than a very mildly elevated heart rate which is variable and when she is at rest it is normal.  She is surprisingly well-appearing both for her age and for her symptoms.  She is at her baseline mental status.  Her labs are generally reassuring with a negative GI pathogen panel and negative C. difficile.  She has a potassium of 5.9 but I think it is extremely unlikely that this is a falsely elevated potassium level given the extent of her diarrhea; there is likely a degree of hemolysis that is not reported.  She has no concerning EKG changes.  She has a very slight elevation of BUN and creatinine, and I acknowledge that to the patient and her son.  I offered IV fluids, but I also explained that she should be able to rehydrate with oral fluids since she is tolerating oral intake.  She would rather go home and I think that is reasonable and appropriate.  I gave her information about the rehydration process and I gave strict return precautions as well as follow-up recommendations.  Given the patient's age and symptoms, I strongly considered hospitalization.  However, given her overall well appearance, her strong desire to go home, and no clear reason for admission, I think it is reasonable to try outpatient management.  I am  writing a prescription for Zofran in case she develops some more nausea.  I also strongly considered a CT scan of the abdomen, but given that she is having no pain and she has absolutely no tenderness to palpation, it does not seem necessary at this time.        FINAL CLINICAL IMPRESSION(S) / ED DIAGNOSES   Final diagnoses:  Diarrhea of presumed infectious  origin     Rx / DC Orders   ED Discharge Orders          Ordered    ondansetron (ZOFRAN-ODT) 4 MG disintegrating tablet        05/18/22 4301             Note:  This document was prepared using Dragon voice recognition software and may include unintentional dictation errors.   Hinda Kehr, MD 05/18/22 4840    Hinda Kehr, MD 05/18/22 [phone]

## 2022-05-23 ENCOUNTER — Telehealth: Payer: Self-pay | Admitting: *Deleted

## 2022-05-23 NOTE — Telephone Encounter (Signed)
     Patient  visit on 05/18/2022  at [facility] was for diarrhea   Have you been able to follow up with your primary care physician? Patient has an appt with PCP later in the month feeling better  The patient was or was not able to obtain any needed medicine or equipment.  Are there diet recommendations that you are having difficulty following?  Patient expresses understanding of discharge instructions and education provided has no other needs at this time. yes  [place] [phone] [address] , [place] [address] Email : [email or web address]

## 2022-06-12 DIAGNOSIS — L821 Other seborrheic keratosis: Secondary | ICD-10-CM | POA: Diagnosis not present

## 2022-06-12 DIAGNOSIS — L570 Actinic keratosis: Secondary | ICD-10-CM | POA: Diagnosis not present

## 2022-06-12 DIAGNOSIS — L578 Other skin changes due to chronic exposure to nonionizing radiation: Secondary | ICD-10-CM | POA: Diagnosis not present

## 2022-06-12 DIAGNOSIS — Z859 Personal history of malignant neoplasm, unspecified: Secondary | ICD-10-CM | POA: Diagnosis not present

## 2022-07-06 DIAGNOSIS — S01511A Laceration without foreign body of lip, initial encounter: Secondary | ICD-10-CM | POA: Diagnosis not present

## 2022-07-06 DIAGNOSIS — J9601 Acute respiratory failure with hypoxia: Secondary | ICD-10-CM | POA: Diagnosis not present

## 2022-07-06 DIAGNOSIS — S29009A Unspecified injury of muscle and tendon of unspecified wall of thorax, initial encounter: Secondary | ICD-10-CM | POA: Diagnosis not present

## 2022-07-06 DIAGNOSIS — S72001A Fracture of unspecified part of neck of right femur, initial encounter for closed fracture: Secondary | ICD-10-CM | POA: Diagnosis not present

## 2022-07-06 DIAGNOSIS — S72141A Displaced intertrochanteric fracture of right femur, initial encounter for closed fracture: Secondary | ICD-10-CM | POA: Diagnosis not present

## 2022-07-06 DIAGNOSIS — R410 Disorientation, unspecified: Secondary | ICD-10-CM | POA: Diagnosis not present

## 2022-07-06 DIAGNOSIS — R001 Bradycardia, unspecified: Secondary | ICD-10-CM | POA: Diagnosis not present

## 2022-07-06 DIAGNOSIS — R278 Other lack of coordination: Secondary | ICD-10-CM | POA: Diagnosis not present

## 2022-07-06 DIAGNOSIS — R000 Tachycardia, unspecified: Secondary | ICD-10-CM | POA: Diagnosis not present

## 2022-07-06 DIAGNOSIS — N179 Acute kidney failure, unspecified: Secondary | ICD-10-CM | POA: Diagnosis not present

## 2022-07-06 DIAGNOSIS — I10 Essential (primary) hypertension: Secondary | ICD-10-CM | POA: Diagnosis not present

## 2022-07-06 DIAGNOSIS — S72101A Unspecified trochanteric fracture of right femur, initial encounter for closed fracture: Secondary | ICD-10-CM | POA: Diagnosis not present

## 2022-07-06 DIAGNOSIS — S42301D Unspecified fracture of shaft of humerus, right arm, subsequent encounter for fracture with routine healing: Secondary | ICD-10-CM | POA: Diagnosis not present

## 2022-07-06 DIAGNOSIS — I5189 Other ill-defined heart diseases: Secondary | ICD-10-CM | POA: Diagnosis not present

## 2022-07-06 DIAGNOSIS — Z20822 Contact with and (suspected) exposure to covid-19: Secondary | ICD-10-CM | POA: Diagnosis not present

## 2022-07-06 DIAGNOSIS — W1830XA Fall on same level, unspecified, initial encounter: Secondary | ICD-10-CM | POA: Diagnosis not present

## 2022-07-06 DIAGNOSIS — I5021 Acute systolic (congestive) heart failure: Secondary | ICD-10-CM | POA: Diagnosis not present

## 2022-07-06 DIAGNOSIS — J479 Bronchiectasis, uncomplicated: Secondary | ICD-10-CM | POA: Diagnosis not present

## 2022-07-06 DIAGNOSIS — E870 Hyperosmolality and hypernatremia: Secondary | ICD-10-CM | POA: Diagnosis not present

## 2022-07-06 DIAGNOSIS — S80811A Abrasion, right lower leg, initial encounter: Secondary | ICD-10-CM | POA: Diagnosis not present

## 2022-07-06 DIAGNOSIS — M6281 Muscle weakness (generalized): Secondary | ICD-10-CM | POA: Diagnosis not present

## 2022-07-06 DIAGNOSIS — N183 Chronic kidney disease, stage 3 unspecified: Secondary | ICD-10-CM | POA: Diagnosis not present

## 2022-07-06 DIAGNOSIS — I5181 Takotsubo syndrome: Secondary | ICD-10-CM | POA: Diagnosis not present

## 2022-07-06 DIAGNOSIS — I959 Hypotension, unspecified: Secondary | ICD-10-CM | POA: Diagnosis not present

## 2022-07-06 DIAGNOSIS — S2242XA Multiple fractures of ribs, left side, initial encounter for closed fracture: Secondary | ICD-10-CM | POA: Diagnosis not present

## 2022-07-06 DIAGNOSIS — D62 Acute posthemorrhagic anemia: Secondary | ICD-10-CM | POA: Diagnosis not present

## 2022-07-06 DIAGNOSIS — Z043 Encounter for examination and observation following other accident: Secondary | ICD-10-CM | POA: Diagnosis not present

## 2022-07-06 DIAGNOSIS — I6522 Occlusion and stenosis of left carotid artery: Secondary | ICD-10-CM | POA: Diagnosis not present

## 2022-07-06 DIAGNOSIS — R0689 Other abnormalities of breathing: Secondary | ICD-10-CM | POA: Diagnosis not present

## 2022-07-06 DIAGNOSIS — R0902 Hypoxemia: Secondary | ICD-10-CM | POA: Diagnosis not present

## 2022-07-06 DIAGNOSIS — R55 Syncope and collapse: Secondary | ICD-10-CM | POA: Diagnosis not present

## 2022-07-06 DIAGNOSIS — R339 Retention of urine, unspecified: Secondary | ICD-10-CM | POA: Diagnosis not present

## 2022-07-06 DIAGNOSIS — F05 Delirium due to known physiological condition: Secondary | ICD-10-CM | POA: Diagnosis not present

## 2022-07-06 DIAGNOSIS — I6521 Occlusion and stenosis of right carotid artery: Secondary | ICD-10-CM | POA: Diagnosis not present

## 2022-07-06 DIAGNOSIS — R41841 Cognitive communication deficit: Secondary | ICD-10-CM | POA: Diagnosis not present

## 2022-07-06 DIAGNOSIS — I255 Ischemic cardiomyopathy: Secondary | ICD-10-CM | POA: Diagnosis not present

## 2022-07-06 DIAGNOSIS — S42291A Other displaced fracture of upper end of right humerus, initial encounter for closed fracture: Secondary | ICD-10-CM | POA: Diagnosis not present

## 2022-07-06 DIAGNOSIS — I2510 Atherosclerotic heart disease of native coronary artery without angina pectoris: Secondary | ICD-10-CM | POA: Diagnosis not present

## 2022-07-06 DIAGNOSIS — S3992XA Unspecified injury of lower back, initial encounter: Secondary | ICD-10-CM | POA: Diagnosis not present

## 2022-07-06 DIAGNOSIS — Z01810 Encounter for preprocedural cardiovascular examination: Secondary | ICD-10-CM | POA: Diagnosis not present

## 2022-07-06 DIAGNOSIS — K449 Diaphragmatic hernia without obstruction or gangrene: Secondary | ICD-10-CM | POA: Diagnosis not present

## 2022-07-06 DIAGNOSIS — K219 Gastro-esophageal reflux disease without esophagitis: Secondary | ICD-10-CM | POA: Diagnosis not present

## 2022-07-06 DIAGNOSIS — W19XXXA Unspecified fall, initial encounter: Secondary | ICD-10-CM | POA: Diagnosis not present

## 2022-07-06 DIAGNOSIS — S3991XA Unspecified injury of abdomen, initial encounter: Secondary | ICD-10-CM | POA: Diagnosis not present

## 2022-07-06 DIAGNOSIS — S2243XA Multiple fractures of ribs, bilateral, initial encounter for closed fracture: Secondary | ICD-10-CM | POA: Diagnosis not present

## 2022-07-06 DIAGNOSIS — S42211A Unspecified displaced fracture of surgical neck of right humerus, initial encounter for closed fracture: Secondary | ICD-10-CM | POA: Diagnosis not present

## 2022-07-06 DIAGNOSIS — D72829 Elevated white blood cell count, unspecified: Secondary | ICD-10-CM | POA: Diagnosis not present

## 2022-07-06 DIAGNOSIS — S72001D Fracture of unspecified part of neck of right femur, subsequent encounter for closed fracture with routine healing: Secondary | ICD-10-CM | POA: Diagnosis not present

## 2022-07-06 DIAGNOSIS — I130 Hypertensive heart and chronic kidney disease with heart failure and stage 1 through stage 4 chronic kidney disease, or unspecified chronic kidney disease: Secondary | ICD-10-CM | POA: Diagnosis not present

## 2022-07-06 DIAGNOSIS — I7789 Other specified disorders of arteries and arterioles: Secondary | ICD-10-CM | POA: Diagnosis not present

## 2022-07-06 DIAGNOSIS — I480 Paroxysmal atrial fibrillation: Secondary | ICD-10-CM | POA: Diagnosis not present

## 2022-07-06 DIAGNOSIS — R2689 Other abnormalities of gait and mobility: Secondary | ICD-10-CM | POA: Diagnosis not present

## 2022-07-06 DIAGNOSIS — Z66 Do not resuscitate: Secondary | ICD-10-CM | POA: Diagnosis not present

## 2022-07-06 DIAGNOSIS — S42201A Unspecified fracture of upper end of right humerus, initial encounter for closed fracture: Secondary | ICD-10-CM | POA: Diagnosis not present

## 2022-07-06 DIAGNOSIS — S299XXA Unspecified injury of thorax, initial encounter: Secondary | ICD-10-CM | POA: Diagnosis not present

## 2022-07-06 DIAGNOSIS — G8918 Other acute postprocedural pain: Secondary | ICD-10-CM | POA: Diagnosis not present

## 2022-07-06 DIAGNOSIS — K222 Esophageal obstruction: Secondary | ICD-10-CM | POA: Diagnosis not present

## 2022-07-06 DIAGNOSIS — S72091A Other fracture of head and neck of right femur, initial encounter for closed fracture: Secondary | ICD-10-CM | POA: Diagnosis not present

## 2022-07-06 DIAGNOSIS — Z01818 Encounter for other preprocedural examination: Secondary | ICD-10-CM | POA: Diagnosis not present

## 2022-07-17 DIAGNOSIS — N390 Urinary tract infection, site not specified: Secondary | ICD-10-CM | POA: Diagnosis not present

## 2022-07-17 DIAGNOSIS — I959 Hypotension, unspecified: Secondary | ICD-10-CM | POA: Diagnosis not present

## 2022-07-17 DIAGNOSIS — M85811 Other specified disorders of bone density and structure, right shoulder: Secondary | ICD-10-CM | POA: Diagnosis not present

## 2022-07-17 DIAGNOSIS — R600 Localized edema: Secondary | ICD-10-CM | POA: Diagnosis not present

## 2022-07-17 DIAGNOSIS — S72141A Displaced intertrochanteric fracture of right femur, initial encounter for closed fracture: Secondary | ICD-10-CM | POA: Diagnosis not present

## 2022-07-17 DIAGNOSIS — R278 Other lack of coordination: Secondary | ICD-10-CM | POA: Diagnosis not present

## 2022-07-17 DIAGNOSIS — S42301D Unspecified fracture of shaft of humerus, right arm, subsequent encounter for fracture with routine healing: Secondary | ICD-10-CM | POA: Diagnosis not present

## 2022-07-17 DIAGNOSIS — S91302A Unspecified open wound, left foot, initial encounter: Secondary | ICD-10-CM | POA: Diagnosis not present

## 2022-07-17 DIAGNOSIS — N184 Chronic kidney disease, stage 4 (severe): Secondary | ICD-10-CM | POA: Diagnosis not present

## 2022-07-17 DIAGNOSIS — S2231XA Fracture of one rib, right side, initial encounter for closed fracture: Secondary | ICD-10-CM | POA: Diagnosis not present

## 2022-07-17 DIAGNOSIS — R296 Repeated falls: Secondary | ICD-10-CM | POA: Diagnosis not present

## 2022-07-17 DIAGNOSIS — I130 Hypertensive heart and chronic kidney disease with heart failure and stage 1 through stage 4 chronic kidney disease, or unspecified chronic kidney disease: Secondary | ICD-10-CM | POA: Diagnosis not present

## 2022-07-17 DIAGNOSIS — R2689 Other abnormalities of gait and mobility: Secondary | ICD-10-CM | POA: Diagnosis not present

## 2022-07-17 DIAGNOSIS — I10 Essential (primary) hypertension: Secondary | ICD-10-CM | POA: Diagnosis not present

## 2022-07-17 DIAGNOSIS — Z9181 History of falling: Secondary | ICD-10-CM | POA: Diagnosis not present

## 2022-07-17 DIAGNOSIS — S42201A Unspecified fracture of upper end of right humerus, initial encounter for closed fracture: Secondary | ICD-10-CM | POA: Diagnosis not present

## 2022-07-17 DIAGNOSIS — R41841 Cognitive communication deficit: Secondary | ICD-10-CM | POA: Diagnosis not present

## 2022-07-17 DIAGNOSIS — S72001A Fracture of unspecified part of neck of right femur, initial encounter for closed fracture: Secondary | ICD-10-CM | POA: Diagnosis not present

## 2022-07-17 DIAGNOSIS — I5021 Acute systolic (congestive) heart failure: Secondary | ICD-10-CM | POA: Diagnosis not present

## 2022-07-17 DIAGNOSIS — I2510 Atherosclerotic heart disease of native coronary artery without angina pectoris: Secondary | ICD-10-CM | POA: Diagnosis not present

## 2022-07-17 DIAGNOSIS — D500 Iron deficiency anemia secondary to blood loss (chronic): Secondary | ICD-10-CM | POA: Diagnosis not present

## 2022-07-17 DIAGNOSIS — S2241XA Multiple fractures of ribs, right side, initial encounter for closed fracture: Secondary | ICD-10-CM | POA: Diagnosis not present

## 2022-07-17 DIAGNOSIS — I700 Atherosclerosis of aorta: Secondary | ICD-10-CM | POA: Diagnosis not present

## 2022-07-17 DIAGNOSIS — M6281 Muscle weakness (generalized): Secondary | ICD-10-CM | POA: Diagnosis not present

## 2022-07-17 DIAGNOSIS — Z66 Do not resuscitate: Secondary | ICD-10-CM | POA: Diagnosis not present

## 2022-07-17 DIAGNOSIS — S72141D Displaced intertrochanteric fracture of right femur, subsequent encounter for closed fracture with routine healing: Secondary | ICD-10-CM | POA: Diagnosis not present

## 2022-07-17 DIAGNOSIS — S72001D Fracture of unspecified part of neck of right femur, subsequent encounter for closed fracture with routine healing: Secondary | ICD-10-CM | POA: Diagnosis not present

## 2022-07-17 DIAGNOSIS — J9601 Acute respiratory failure with hypoxia: Secondary | ICD-10-CM | POA: Diagnosis not present

## 2022-07-17 DIAGNOSIS — R0789 Other chest pain: Secondary | ICD-10-CM | POA: Diagnosis not present

## 2022-07-17 DIAGNOSIS — I480 Paroxysmal atrial fibrillation: Secondary | ICD-10-CM | POA: Diagnosis not present

## 2022-07-17 DIAGNOSIS — S42211D Unspecified displaced fracture of surgical neck of right humerus, subsequent encounter for fracture with routine healing: Secondary | ICD-10-CM | POA: Diagnosis not present

## 2022-07-17 DIAGNOSIS — I5022 Chronic systolic (congestive) heart failure: Secondary | ICD-10-CM | POA: Diagnosis not present

## 2022-07-18 DIAGNOSIS — I5021 Acute systolic (congestive) heart failure: Secondary | ICD-10-CM | POA: Diagnosis not present

## 2022-07-18 DIAGNOSIS — S42201A Unspecified fracture of upper end of right humerus, initial encounter for closed fracture: Secondary | ICD-10-CM | POA: Diagnosis not present

## 2022-07-18 DIAGNOSIS — R296 Repeated falls: Secondary | ICD-10-CM | POA: Diagnosis not present

## 2022-07-18 DIAGNOSIS — M6281 Muscle weakness (generalized): Secondary | ICD-10-CM | POA: Diagnosis not present

## 2022-07-18 DIAGNOSIS — S2231XA Fracture of one rib, right side, initial encounter for closed fracture: Secondary | ICD-10-CM | POA: Diagnosis not present

## 2022-07-18 DIAGNOSIS — I480 Paroxysmal atrial fibrillation: Secondary | ICD-10-CM | POA: Diagnosis not present

## 2022-07-18 DIAGNOSIS — J9601 Acute respiratory failure with hypoxia: Secondary | ICD-10-CM | POA: Diagnosis not present

## 2022-07-18 DIAGNOSIS — I10 Essential (primary) hypertension: Secondary | ICD-10-CM | POA: Diagnosis not present

## 2022-07-18 DIAGNOSIS — I130 Hypertensive heart and chronic kidney disease with heart failure and stage 1 through stage 4 chronic kidney disease, or unspecified chronic kidney disease: Secondary | ICD-10-CM | POA: Diagnosis not present

## 2022-07-18 DIAGNOSIS — S72001A Fracture of unspecified part of neck of right femur, initial encounter for closed fracture: Secondary | ICD-10-CM | POA: Diagnosis not present

## 2022-07-18 DIAGNOSIS — N184 Chronic kidney disease, stage 4 (severe): Secondary | ICD-10-CM | POA: Diagnosis not present

## 2022-07-19 DIAGNOSIS — M6281 Muscle weakness (generalized): Secondary | ICD-10-CM | POA: Diagnosis not present

## 2022-07-19 DIAGNOSIS — S72001D Fracture of unspecified part of neck of right femur, subsequent encounter for closed fracture with routine healing: Secondary | ICD-10-CM | POA: Diagnosis not present

## 2022-07-19 DIAGNOSIS — S42301D Unspecified fracture of shaft of humerus, right arm, subsequent encounter for fracture with routine healing: Secondary | ICD-10-CM | POA: Diagnosis not present

## 2022-07-19 DIAGNOSIS — R2689 Other abnormalities of gait and mobility: Secondary | ICD-10-CM | POA: Diagnosis not present

## 2022-07-19 DIAGNOSIS — R278 Other lack of coordination: Secondary | ICD-10-CM | POA: Diagnosis not present

## 2022-07-19 DIAGNOSIS — Z9181 History of falling: Secondary | ICD-10-CM | POA: Diagnosis not present

## 2022-07-22 DIAGNOSIS — I480 Paroxysmal atrial fibrillation: Secondary | ICD-10-CM | POA: Diagnosis not present

## 2022-07-22 DIAGNOSIS — M6281 Muscle weakness (generalized): Secondary | ICD-10-CM | POA: Diagnosis not present

## 2022-07-22 DIAGNOSIS — R296 Repeated falls: Secondary | ICD-10-CM | POA: Diagnosis not present

## 2022-07-22 DIAGNOSIS — S72001A Fracture of unspecified part of neck of right femur, initial encounter for closed fracture: Secondary | ICD-10-CM | POA: Diagnosis not present

## 2022-07-22 DIAGNOSIS — J9601 Acute respiratory failure with hypoxia: Secondary | ICD-10-CM | POA: Diagnosis not present

## 2022-07-22 DIAGNOSIS — N184 Chronic kidney disease, stage 4 (severe): Secondary | ICD-10-CM | POA: Diagnosis not present

## 2022-07-22 DIAGNOSIS — S2231XA Fracture of one rib, right side, initial encounter for closed fracture: Secondary | ICD-10-CM | POA: Diagnosis not present

## 2022-07-22 DIAGNOSIS — I5021 Acute systolic (congestive) heart failure: Secondary | ICD-10-CM | POA: Diagnosis not present

## 2022-07-22 DIAGNOSIS — S42201A Unspecified fracture of upper end of right humerus, initial encounter for closed fracture: Secondary | ICD-10-CM | POA: Diagnosis not present

## 2022-07-22 DIAGNOSIS — I10 Essential (primary) hypertension: Secondary | ICD-10-CM | POA: Diagnosis not present

## 2022-07-23 DIAGNOSIS — I480 Paroxysmal atrial fibrillation: Secondary | ICD-10-CM | POA: Diagnosis not present

## 2022-07-23 DIAGNOSIS — R2689 Other abnormalities of gait and mobility: Secondary | ICD-10-CM | POA: Diagnosis not present

## 2022-07-23 DIAGNOSIS — S2231XA Fracture of one rib, right side, initial encounter for closed fracture: Secondary | ICD-10-CM | POA: Diagnosis not present

## 2022-07-23 DIAGNOSIS — I5021 Acute systolic (congestive) heart failure: Secondary | ICD-10-CM | POA: Diagnosis not present

## 2022-07-23 DIAGNOSIS — Z9181 History of falling: Secondary | ICD-10-CM | POA: Diagnosis not present

## 2022-07-23 DIAGNOSIS — I10 Essential (primary) hypertension: Secondary | ICD-10-CM | POA: Diagnosis not present

## 2022-07-23 DIAGNOSIS — S42201A Unspecified fracture of upper end of right humerus, initial encounter for closed fracture: Secondary | ICD-10-CM | POA: Diagnosis not present

## 2022-07-23 DIAGNOSIS — S72001A Fracture of unspecified part of neck of right femur, initial encounter for closed fracture: Secondary | ICD-10-CM | POA: Diagnosis not present

## 2022-07-23 DIAGNOSIS — M6281 Muscle weakness (generalized): Secondary | ICD-10-CM | POA: Diagnosis not present

## 2022-07-23 DIAGNOSIS — R278 Other lack of coordination: Secondary | ICD-10-CM | POA: Diagnosis not present

## 2022-07-23 DIAGNOSIS — S42301D Unspecified fracture of shaft of humerus, right arm, subsequent encounter for fracture with routine healing: Secondary | ICD-10-CM | POA: Diagnosis not present

## 2022-07-23 DIAGNOSIS — J9601 Acute respiratory failure with hypoxia: Secondary | ICD-10-CM | POA: Diagnosis not present

## 2022-07-23 DIAGNOSIS — S72001D Fracture of unspecified part of neck of right femur, subsequent encounter for closed fracture with routine healing: Secondary | ICD-10-CM | POA: Diagnosis not present

## 2022-07-23 DIAGNOSIS — N184 Chronic kidney disease, stage 4 (severe): Secondary | ICD-10-CM | POA: Diagnosis not present

## 2022-07-23 DIAGNOSIS — R296 Repeated falls: Secondary | ICD-10-CM | POA: Diagnosis not present

## 2022-07-24 DIAGNOSIS — M6281 Muscle weakness (generalized): Secondary | ICD-10-CM | POA: Diagnosis not present

## 2022-07-24 DIAGNOSIS — N184 Chronic kidney disease, stage 4 (severe): Secondary | ICD-10-CM | POA: Diagnosis not present

## 2022-07-24 DIAGNOSIS — I5021 Acute systolic (congestive) heart failure: Secondary | ICD-10-CM | POA: Diagnosis not present

## 2022-07-24 DIAGNOSIS — I480 Paroxysmal atrial fibrillation: Secondary | ICD-10-CM | POA: Diagnosis not present

## 2022-07-24 DIAGNOSIS — R296 Repeated falls: Secondary | ICD-10-CM | POA: Diagnosis not present

## 2022-07-24 DIAGNOSIS — S42201A Unspecified fracture of upper end of right humerus, initial encounter for closed fracture: Secondary | ICD-10-CM | POA: Diagnosis not present

## 2022-07-24 DIAGNOSIS — S72001A Fracture of unspecified part of neck of right femur, initial encounter for closed fracture: Secondary | ICD-10-CM | POA: Diagnosis not present

## 2022-07-24 DIAGNOSIS — I130 Hypertensive heart and chronic kidney disease with heart failure and stage 1 through stage 4 chronic kidney disease, or unspecified chronic kidney disease: Secondary | ICD-10-CM | POA: Diagnosis not present

## 2022-07-24 DIAGNOSIS — I10 Essential (primary) hypertension: Secondary | ICD-10-CM | POA: Diagnosis not present

## 2022-07-24 DIAGNOSIS — S2231XA Fracture of one rib, right side, initial encounter for closed fracture: Secondary | ICD-10-CM | POA: Diagnosis not present

## 2022-07-24 DIAGNOSIS — J9601 Acute respiratory failure with hypoxia: Secondary | ICD-10-CM | POA: Diagnosis not present

## 2022-07-26 DIAGNOSIS — I10 Essential (primary) hypertension: Secondary | ICD-10-CM | POA: Diagnosis not present

## 2022-07-26 DIAGNOSIS — S42201A Unspecified fracture of upper end of right humerus, initial encounter for closed fracture: Secondary | ICD-10-CM | POA: Diagnosis not present

## 2022-07-26 DIAGNOSIS — R2689 Other abnormalities of gait and mobility: Secondary | ICD-10-CM | POA: Diagnosis not present

## 2022-07-26 DIAGNOSIS — I5021 Acute systolic (congestive) heart failure: Secondary | ICD-10-CM | POA: Diagnosis not present

## 2022-07-26 DIAGNOSIS — S2231XA Fracture of one rib, right side, initial encounter for closed fracture: Secondary | ICD-10-CM | POA: Diagnosis not present

## 2022-07-26 DIAGNOSIS — N184 Chronic kidney disease, stage 4 (severe): Secondary | ICD-10-CM | POA: Diagnosis not present

## 2022-07-26 DIAGNOSIS — M6281 Muscle weakness (generalized): Secondary | ICD-10-CM | POA: Diagnosis not present

## 2022-07-26 DIAGNOSIS — S42301D Unspecified fracture of shaft of humerus, right arm, subsequent encounter for fracture with routine healing: Secondary | ICD-10-CM | POA: Diagnosis not present

## 2022-07-26 DIAGNOSIS — I480 Paroxysmal atrial fibrillation: Secondary | ICD-10-CM | POA: Diagnosis not present

## 2022-07-26 DIAGNOSIS — J9601 Acute respiratory failure with hypoxia: Secondary | ICD-10-CM | POA: Diagnosis not present

## 2022-07-26 DIAGNOSIS — S72001A Fracture of unspecified part of neck of right femur, initial encounter for closed fracture: Secondary | ICD-10-CM | POA: Diagnosis not present

## 2022-07-26 DIAGNOSIS — Z9181 History of falling: Secondary | ICD-10-CM | POA: Diagnosis not present

## 2022-07-26 DIAGNOSIS — S72001D Fracture of unspecified part of neck of right femur, subsequent encounter for closed fracture with routine healing: Secondary | ICD-10-CM | POA: Diagnosis not present

## 2022-07-26 DIAGNOSIS — D500 Iron deficiency anemia secondary to blood loss (chronic): Secondary | ICD-10-CM | POA: Diagnosis not present

## 2022-07-26 DIAGNOSIS — R296 Repeated falls: Secondary | ICD-10-CM | POA: Diagnosis not present

## 2022-07-26 DIAGNOSIS — R278 Other lack of coordination: Secondary | ICD-10-CM | POA: Diagnosis not present

## 2022-07-29 DIAGNOSIS — N184 Chronic kidney disease, stage 4 (severe): Secondary | ICD-10-CM | POA: Diagnosis not present

## 2022-07-29 DIAGNOSIS — I10 Essential (primary) hypertension: Secondary | ICD-10-CM | POA: Diagnosis not present

## 2022-07-29 DIAGNOSIS — J9601 Acute respiratory failure with hypoxia: Secondary | ICD-10-CM | POA: Diagnosis not present

## 2022-07-29 DIAGNOSIS — I480 Paroxysmal atrial fibrillation: Secondary | ICD-10-CM | POA: Diagnosis not present

## 2022-07-29 DIAGNOSIS — M6281 Muscle weakness (generalized): Secondary | ICD-10-CM | POA: Diagnosis not present

## 2022-07-29 DIAGNOSIS — R296 Repeated falls: Secondary | ICD-10-CM | POA: Diagnosis not present

## 2022-07-29 DIAGNOSIS — S2231XA Fracture of one rib, right side, initial encounter for closed fracture: Secondary | ICD-10-CM | POA: Diagnosis not present

## 2022-07-29 DIAGNOSIS — S42201A Unspecified fracture of upper end of right humerus, initial encounter for closed fracture: Secondary | ICD-10-CM | POA: Diagnosis not present

## 2022-07-29 DIAGNOSIS — D500 Iron deficiency anemia secondary to blood loss (chronic): Secondary | ICD-10-CM | POA: Diagnosis not present

## 2022-07-29 DIAGNOSIS — I5021 Acute systolic (congestive) heart failure: Secondary | ICD-10-CM | POA: Diagnosis not present

## 2022-07-29 DIAGNOSIS — S72001A Fracture of unspecified part of neck of right femur, initial encounter for closed fracture: Secondary | ICD-10-CM | POA: Diagnosis not present

## 2022-07-30 DIAGNOSIS — Z9181 History of falling: Secondary | ICD-10-CM | POA: Diagnosis not present

## 2022-07-30 DIAGNOSIS — S72001D Fracture of unspecified part of neck of right femur, subsequent encounter for closed fracture with routine healing: Secondary | ICD-10-CM | POA: Diagnosis not present

## 2022-07-30 DIAGNOSIS — R278 Other lack of coordination: Secondary | ICD-10-CM | POA: Diagnosis not present

## 2022-07-30 DIAGNOSIS — S42301D Unspecified fracture of shaft of humerus, right arm, subsequent encounter for fracture with routine healing: Secondary | ICD-10-CM | POA: Diagnosis not present

## 2022-07-30 DIAGNOSIS — R2689 Other abnormalities of gait and mobility: Secondary | ICD-10-CM | POA: Diagnosis not present

## 2022-07-30 DIAGNOSIS — M6281 Muscle weakness (generalized): Secondary | ICD-10-CM | POA: Diagnosis not present

## 2022-07-31 DIAGNOSIS — R296 Repeated falls: Secondary | ICD-10-CM | POA: Diagnosis not present

## 2022-07-31 DIAGNOSIS — S42201A Unspecified fracture of upper end of right humerus, initial encounter for closed fracture: Secondary | ICD-10-CM | POA: Diagnosis not present

## 2022-07-31 DIAGNOSIS — I5021 Acute systolic (congestive) heart failure: Secondary | ICD-10-CM | POA: Diagnosis not present

## 2022-07-31 DIAGNOSIS — N184 Chronic kidney disease, stage 4 (severe): Secondary | ICD-10-CM | POA: Diagnosis not present

## 2022-07-31 DIAGNOSIS — S2231XA Fracture of one rib, right side, initial encounter for closed fracture: Secondary | ICD-10-CM | POA: Diagnosis not present

## 2022-07-31 DIAGNOSIS — M6281 Muscle weakness (generalized): Secondary | ICD-10-CM | POA: Diagnosis not present

## 2022-07-31 DIAGNOSIS — I480 Paroxysmal atrial fibrillation: Secondary | ICD-10-CM | POA: Diagnosis not present

## 2022-07-31 DIAGNOSIS — D500 Iron deficiency anemia secondary to blood loss (chronic): Secondary | ICD-10-CM | POA: Diagnosis not present

## 2022-07-31 DIAGNOSIS — S72001A Fracture of unspecified part of neck of right femur, initial encounter for closed fracture: Secondary | ICD-10-CM | POA: Diagnosis not present

## 2022-07-31 DIAGNOSIS — J9601 Acute respiratory failure with hypoxia: Secondary | ICD-10-CM | POA: Diagnosis not present

## 2022-07-31 DIAGNOSIS — I10 Essential (primary) hypertension: Secondary | ICD-10-CM | POA: Diagnosis not present

## 2022-08-01 DIAGNOSIS — Z66 Do not resuscitate: Secondary | ICD-10-CM | POA: Diagnosis not present

## 2022-08-01 DIAGNOSIS — S42201A Unspecified fracture of upper end of right humerus, initial encounter for closed fracture: Secondary | ICD-10-CM | POA: Diagnosis not present

## 2022-08-01 DIAGNOSIS — S72141D Displaced intertrochanteric fracture of right femur, subsequent encounter for closed fracture with routine healing: Secondary | ICD-10-CM | POA: Diagnosis not present

## 2022-08-01 DIAGNOSIS — S72141A Displaced intertrochanteric fracture of right femur, initial encounter for closed fracture: Secondary | ICD-10-CM | POA: Diagnosis not present

## 2022-08-01 DIAGNOSIS — S42211D Unspecified displaced fracture of surgical neck of right humerus, subsequent encounter for fracture with routine healing: Secondary | ICD-10-CM | POA: Diagnosis not present

## 2022-08-01 DIAGNOSIS — I700 Atherosclerosis of aorta: Secondary | ICD-10-CM | POA: Diagnosis not present

## 2022-08-01 DIAGNOSIS — M85811 Other specified disorders of bone density and structure, right shoulder: Secondary | ICD-10-CM | POA: Diagnosis not present

## 2022-08-02 DIAGNOSIS — M6281 Muscle weakness (generalized): Secondary | ICD-10-CM | POA: Diagnosis not present

## 2022-08-02 DIAGNOSIS — N390 Urinary tract infection, site not specified: Secondary | ICD-10-CM | POA: Diagnosis not present

## 2022-08-02 DIAGNOSIS — R2689 Other abnormalities of gait and mobility: Secondary | ICD-10-CM | POA: Diagnosis not present

## 2022-08-02 DIAGNOSIS — S72001D Fracture of unspecified part of neck of right femur, subsequent encounter for closed fracture with routine healing: Secondary | ICD-10-CM | POA: Diagnosis not present

## 2022-08-02 DIAGNOSIS — Z9181 History of falling: Secondary | ICD-10-CM | POA: Diagnosis not present

## 2022-08-02 DIAGNOSIS — R278 Other lack of coordination: Secondary | ICD-10-CM | POA: Diagnosis not present

## 2022-08-02 DIAGNOSIS — S42301D Unspecified fracture of shaft of humerus, right arm, subsequent encounter for fracture with routine healing: Secondary | ICD-10-CM | POA: Diagnosis not present

## 2022-08-06 DIAGNOSIS — R278 Other lack of coordination: Secondary | ICD-10-CM | POA: Diagnosis not present

## 2022-08-06 DIAGNOSIS — S42301D Unspecified fracture of shaft of humerus, right arm, subsequent encounter for fracture with routine healing: Secondary | ICD-10-CM | POA: Diagnosis not present

## 2022-08-06 DIAGNOSIS — N390 Urinary tract infection, site not specified: Secondary | ICD-10-CM | POA: Diagnosis not present

## 2022-08-06 DIAGNOSIS — S72001D Fracture of unspecified part of neck of right femur, subsequent encounter for closed fracture with routine healing: Secondary | ICD-10-CM | POA: Diagnosis not present

## 2022-08-06 DIAGNOSIS — M6281 Muscle weakness (generalized): Secondary | ICD-10-CM | POA: Diagnosis not present

## 2022-08-06 DIAGNOSIS — R2689 Other abnormalities of gait and mobility: Secondary | ICD-10-CM | POA: Diagnosis not present

## 2022-08-06 DIAGNOSIS — S91302A Unspecified open wound, left foot, initial encounter: Secondary | ICD-10-CM | POA: Diagnosis not present

## 2022-08-06 DIAGNOSIS — Z9181 History of falling: Secondary | ICD-10-CM | POA: Diagnosis not present

## 2022-08-08 DIAGNOSIS — S42201A Unspecified fracture of upper end of right humerus, initial encounter for closed fracture: Secondary | ICD-10-CM | POA: Diagnosis not present

## 2022-08-08 DIAGNOSIS — J9601 Acute respiratory failure with hypoxia: Secondary | ICD-10-CM | POA: Diagnosis not present

## 2022-08-08 DIAGNOSIS — S2231XA Fracture of one rib, right side, initial encounter for closed fracture: Secondary | ICD-10-CM | POA: Diagnosis not present

## 2022-08-08 DIAGNOSIS — I10 Essential (primary) hypertension: Secondary | ICD-10-CM | POA: Diagnosis not present

## 2022-08-08 DIAGNOSIS — N184 Chronic kidney disease, stage 4 (severe): Secondary | ICD-10-CM | POA: Diagnosis not present

## 2022-08-08 DIAGNOSIS — D500 Iron deficiency anemia secondary to blood loss (chronic): Secondary | ICD-10-CM | POA: Diagnosis not present

## 2022-08-08 DIAGNOSIS — R600 Localized edema: Secondary | ICD-10-CM | POA: Diagnosis not present

## 2022-08-08 DIAGNOSIS — M6281 Muscle weakness (generalized): Secondary | ICD-10-CM | POA: Diagnosis not present

## 2022-08-08 DIAGNOSIS — S72001A Fracture of unspecified part of neck of right femur, initial encounter for closed fracture: Secondary | ICD-10-CM | POA: Diagnosis not present

## 2022-08-08 DIAGNOSIS — R296 Repeated falls: Secondary | ICD-10-CM | POA: Diagnosis not present

## 2022-08-08 DIAGNOSIS — I5021 Acute systolic (congestive) heart failure: Secondary | ICD-10-CM | POA: Diagnosis not present

## 2022-08-08 DIAGNOSIS — I480 Paroxysmal atrial fibrillation: Secondary | ICD-10-CM | POA: Diagnosis not present

## 2022-08-09 DIAGNOSIS — Z9181 History of falling: Secondary | ICD-10-CM | POA: Diagnosis not present

## 2022-08-09 DIAGNOSIS — M6281 Muscle weakness (generalized): Secondary | ICD-10-CM | POA: Diagnosis not present

## 2022-08-09 DIAGNOSIS — R278 Other lack of coordination: Secondary | ICD-10-CM | POA: Diagnosis not present

## 2022-08-09 DIAGNOSIS — S42301D Unspecified fracture of shaft of humerus, right arm, subsequent encounter for fracture with routine healing: Secondary | ICD-10-CM | POA: Diagnosis not present

## 2022-08-09 DIAGNOSIS — S72001D Fracture of unspecified part of neck of right femur, subsequent encounter for closed fracture with routine healing: Secondary | ICD-10-CM | POA: Diagnosis not present

## 2022-08-09 DIAGNOSIS — R2689 Other abnormalities of gait and mobility: Secondary | ICD-10-CM | POA: Diagnosis not present

## 2022-08-09 DIAGNOSIS — N390 Urinary tract infection, site not specified: Secondary | ICD-10-CM | POA: Diagnosis not present

## 2022-08-12 DIAGNOSIS — S2231XA Fracture of one rib, right side, initial encounter for closed fracture: Secondary | ICD-10-CM | POA: Diagnosis not present

## 2022-08-12 DIAGNOSIS — S72001A Fracture of unspecified part of neck of right femur, initial encounter for closed fracture: Secondary | ICD-10-CM | POA: Diagnosis not present

## 2022-08-12 DIAGNOSIS — S42201A Unspecified fracture of upper end of right humerus, initial encounter for closed fracture: Secondary | ICD-10-CM | POA: Diagnosis not present

## 2022-08-13 DIAGNOSIS — S91302A Unspecified open wound, left foot, initial encounter: Secondary | ICD-10-CM | POA: Diagnosis not present

## 2022-08-13 DIAGNOSIS — N390 Urinary tract infection, site not specified: Secondary | ICD-10-CM | POA: Diagnosis not present

## 2022-08-13 DIAGNOSIS — R278 Other lack of coordination: Secondary | ICD-10-CM | POA: Diagnosis not present

## 2022-08-13 DIAGNOSIS — M6281 Muscle weakness (generalized): Secondary | ICD-10-CM | POA: Diagnosis not present

## 2022-08-13 DIAGNOSIS — S72001D Fracture of unspecified part of neck of right femur, subsequent encounter for closed fracture with routine healing: Secondary | ICD-10-CM | POA: Diagnosis not present

## 2022-08-13 DIAGNOSIS — R2689 Other abnormalities of gait and mobility: Secondary | ICD-10-CM | POA: Diagnosis not present

## 2022-08-13 DIAGNOSIS — S42301D Unspecified fracture of shaft of humerus, right arm, subsequent encounter for fracture with routine healing: Secondary | ICD-10-CM | POA: Diagnosis not present

## 2022-08-13 DIAGNOSIS — Z9181 History of falling: Secondary | ICD-10-CM | POA: Diagnosis not present

## 2022-08-15 DIAGNOSIS — N184 Chronic kidney disease, stage 4 (severe): Secondary | ICD-10-CM | POA: Diagnosis not present

## 2022-08-15 DIAGNOSIS — J9601 Acute respiratory failure with hypoxia: Secondary | ICD-10-CM | POA: Diagnosis not present

## 2022-08-15 DIAGNOSIS — M6281 Muscle weakness (generalized): Secondary | ICD-10-CM | POA: Diagnosis not present

## 2022-08-15 DIAGNOSIS — R296 Repeated falls: Secondary | ICD-10-CM | POA: Diagnosis not present

## 2022-08-15 DIAGNOSIS — R600 Localized edema: Secondary | ICD-10-CM | POA: Diagnosis not present

## 2022-08-15 DIAGNOSIS — I5021 Acute systolic (congestive) heart failure: Secondary | ICD-10-CM | POA: Diagnosis not present

## 2022-08-15 DIAGNOSIS — I480 Paroxysmal atrial fibrillation: Secondary | ICD-10-CM | POA: Diagnosis not present

## 2022-08-15 DIAGNOSIS — D500 Iron deficiency anemia secondary to blood loss (chronic): Secondary | ICD-10-CM | POA: Diagnosis not present

## 2022-08-15 DIAGNOSIS — S42201A Unspecified fracture of upper end of right humerus, initial encounter for closed fracture: Secondary | ICD-10-CM | POA: Diagnosis not present

## 2022-08-15 DIAGNOSIS — I10 Essential (primary) hypertension: Secondary | ICD-10-CM | POA: Diagnosis not present

## 2022-08-15 DIAGNOSIS — S72001A Fracture of unspecified part of neck of right femur, initial encounter for closed fracture: Secondary | ICD-10-CM | POA: Diagnosis not present

## 2022-08-15 DIAGNOSIS — S2231XA Fracture of one rib, right side, initial encounter for closed fracture: Secondary | ICD-10-CM | POA: Diagnosis not present

## 2022-08-16 DIAGNOSIS — N390 Urinary tract infection, site not specified: Secondary | ICD-10-CM | POA: Diagnosis not present

## 2022-08-16 DIAGNOSIS — Z9181 History of falling: Secondary | ICD-10-CM | POA: Diagnosis not present

## 2022-08-16 DIAGNOSIS — S42301D Unspecified fracture of shaft of humerus, right arm, subsequent encounter for fracture with routine healing: Secondary | ICD-10-CM | POA: Diagnosis not present

## 2022-08-16 DIAGNOSIS — S72001D Fracture of unspecified part of neck of right femur, subsequent encounter for closed fracture with routine healing: Secondary | ICD-10-CM | POA: Diagnosis not present

## 2022-08-16 DIAGNOSIS — R278 Other lack of coordination: Secondary | ICD-10-CM | POA: Diagnosis not present

## 2022-08-16 DIAGNOSIS — M6281 Muscle weakness (generalized): Secondary | ICD-10-CM | POA: Diagnosis not present

## 2022-08-16 DIAGNOSIS — R2689 Other abnormalities of gait and mobility: Secondary | ICD-10-CM | POA: Diagnosis not present

## 2022-08-20 DIAGNOSIS — S42301D Unspecified fracture of shaft of humerus, right arm, subsequent encounter for fracture with routine healing: Secondary | ICD-10-CM | POA: Diagnosis not present

## 2022-08-20 DIAGNOSIS — N184 Chronic kidney disease, stage 4 (severe): Secondary | ICD-10-CM | POA: Diagnosis not present

## 2022-08-20 DIAGNOSIS — S91302A Unspecified open wound, left foot, initial encounter: Secondary | ICD-10-CM | POA: Diagnosis not present

## 2022-08-20 DIAGNOSIS — D500 Iron deficiency anemia secondary to blood loss (chronic): Secondary | ICD-10-CM | POA: Diagnosis not present

## 2022-08-20 DIAGNOSIS — R278 Other lack of coordination: Secondary | ICD-10-CM | POA: Diagnosis not present

## 2022-08-20 DIAGNOSIS — I5022 Chronic systolic (congestive) heart failure: Secondary | ICD-10-CM | POA: Diagnosis not present

## 2022-08-20 DIAGNOSIS — S72001D Fracture of unspecified part of neck of right femur, subsequent encounter for closed fracture with routine healing: Secondary | ICD-10-CM | POA: Diagnosis not present

## 2022-08-20 DIAGNOSIS — J9601 Acute respiratory failure with hypoxia: Secondary | ICD-10-CM | POA: Diagnosis not present

## 2022-08-20 DIAGNOSIS — R2689 Other abnormalities of gait and mobility: Secondary | ICD-10-CM | POA: Diagnosis not present

## 2022-08-20 DIAGNOSIS — R0789 Other chest pain: Secondary | ICD-10-CM | POA: Diagnosis not present

## 2022-08-20 DIAGNOSIS — Z9181 History of falling: Secondary | ICD-10-CM | POA: Diagnosis not present

## 2022-08-20 DIAGNOSIS — S2241XA Multiple fractures of ribs, right side, initial encounter for closed fracture: Secondary | ICD-10-CM | POA: Diagnosis not present

## 2022-08-20 DIAGNOSIS — M6281 Muscle weakness (generalized): Secondary | ICD-10-CM | POA: Diagnosis not present

## 2022-08-20 DIAGNOSIS — N390 Urinary tract infection, site not specified: Secondary | ICD-10-CM | POA: Diagnosis not present

## 2022-08-23 DIAGNOSIS — M6281 Muscle weakness (generalized): Secondary | ICD-10-CM | POA: Diagnosis not present

## 2022-08-23 DIAGNOSIS — R2689 Other abnormalities of gait and mobility: Secondary | ICD-10-CM | POA: Diagnosis not present

## 2022-08-23 DIAGNOSIS — N390 Urinary tract infection, site not specified: Secondary | ICD-10-CM | POA: Diagnosis not present

## 2022-08-23 DIAGNOSIS — Z9181 History of falling: Secondary | ICD-10-CM | POA: Diagnosis not present

## 2022-08-23 DIAGNOSIS — S72001D Fracture of unspecified part of neck of right femur, subsequent encounter for closed fracture with routine healing: Secondary | ICD-10-CM | POA: Diagnosis not present

## 2022-08-23 DIAGNOSIS — R278 Other lack of coordination: Secondary | ICD-10-CM | POA: Diagnosis not present

## 2022-08-23 DIAGNOSIS — S42301D Unspecified fracture of shaft of humerus, right arm, subsequent encounter for fracture with routine healing: Secondary | ICD-10-CM | POA: Diagnosis not present

## 2022-08-27 DIAGNOSIS — S91302A Unspecified open wound, left foot, initial encounter: Secondary | ICD-10-CM | POA: Diagnosis not present

## 2022-08-28 DIAGNOSIS — S42301D Unspecified fracture of shaft of humerus, right arm, subsequent encounter for fracture with routine healing: Secondary | ICD-10-CM | POA: Diagnosis not present

## 2022-08-28 DIAGNOSIS — N390 Urinary tract infection, site not specified: Secondary | ICD-10-CM | POA: Diagnosis not present

## 2022-08-28 DIAGNOSIS — R278 Other lack of coordination: Secondary | ICD-10-CM | POA: Diagnosis not present

## 2022-08-28 DIAGNOSIS — S72001D Fracture of unspecified part of neck of right femur, subsequent encounter for closed fracture with routine healing: Secondary | ICD-10-CM | POA: Diagnosis not present

## 2022-08-28 DIAGNOSIS — M6281 Muscle weakness (generalized): Secondary | ICD-10-CM | POA: Diagnosis not present

## 2022-08-28 DIAGNOSIS — R2689 Other abnormalities of gait and mobility: Secondary | ICD-10-CM | POA: Diagnosis not present

## 2022-08-28 DIAGNOSIS — Z9181 History of falling: Secondary | ICD-10-CM | POA: Diagnosis not present

## 2022-09-02 DIAGNOSIS — S72141D Displaced intertrochanteric fracture of right femur, subsequent encounter for closed fracture with routine healing: Secondary | ICD-10-CM | POA: Diagnosis not present

## 2022-09-02 DIAGNOSIS — S72141A Displaced intertrochanteric fracture of right femur, initial encounter for closed fracture: Secondary | ICD-10-CM | POA: Diagnosis not present

## 2022-09-06 DIAGNOSIS — R2689 Other abnormalities of gait and mobility: Secondary | ICD-10-CM | POA: Diagnosis not present

## 2022-09-06 DIAGNOSIS — S72001D Fracture of unspecified part of neck of right femur, subsequent encounter for closed fracture with routine healing: Secondary | ICD-10-CM | POA: Diagnosis not present

## 2022-09-06 DIAGNOSIS — S42301D Unspecified fracture of shaft of humerus, right arm, subsequent encounter for fracture with routine healing: Secondary | ICD-10-CM | POA: Diagnosis not present

## 2022-09-06 DIAGNOSIS — R278 Other lack of coordination: Secondary | ICD-10-CM | POA: Diagnosis not present

## 2022-09-06 DIAGNOSIS — Z9181 History of falling: Secondary | ICD-10-CM | POA: Diagnosis not present

## 2022-09-06 DIAGNOSIS — M6281 Muscle weakness (generalized): Secondary | ICD-10-CM | POA: Diagnosis not present

## 2022-09-06 DIAGNOSIS — N390 Urinary tract infection, site not specified: Secondary | ICD-10-CM | POA: Diagnosis not present

## 2022-09-11 DIAGNOSIS — S72001D Fracture of unspecified part of neck of right femur, subsequent encounter for closed fracture with routine healing: Secondary | ICD-10-CM | POA: Diagnosis not present

## 2022-09-11 DIAGNOSIS — Z9181 History of falling: Secondary | ICD-10-CM | POA: Diagnosis not present

## 2022-09-11 DIAGNOSIS — R278 Other lack of coordination: Secondary | ICD-10-CM | POA: Diagnosis not present

## 2022-09-11 DIAGNOSIS — S42301D Unspecified fracture of shaft of humerus, right arm, subsequent encounter for fracture with routine healing: Secondary | ICD-10-CM | POA: Diagnosis not present

## 2022-09-11 DIAGNOSIS — M6281 Muscle weakness (generalized): Secondary | ICD-10-CM | POA: Diagnosis not present

## 2022-09-11 DIAGNOSIS — N390 Urinary tract infection, site not specified: Secondary | ICD-10-CM | POA: Diagnosis not present

## 2022-09-11 DIAGNOSIS — R2689 Other abnormalities of gait and mobility: Secondary | ICD-10-CM | POA: Diagnosis not present

## 2022-09-13 DIAGNOSIS — S2241XA Multiple fractures of ribs, right side, initial encounter for closed fracture: Secondary | ICD-10-CM | POA: Diagnosis not present

## 2022-09-13 DIAGNOSIS — J9601 Acute respiratory failure with hypoxia: Secondary | ICD-10-CM | POA: Diagnosis not present

## 2022-09-13 DIAGNOSIS — N184 Chronic kidney disease, stage 4 (severe): Secondary | ICD-10-CM | POA: Diagnosis not present

## 2022-09-13 DIAGNOSIS — D500 Iron deficiency anemia secondary to blood loss (chronic): Secondary | ICD-10-CM | POA: Diagnosis not present

## 2022-09-13 DIAGNOSIS — M6281 Muscle weakness (generalized): Secondary | ICD-10-CM | POA: Diagnosis not present

## 2022-09-13 DIAGNOSIS — R0789 Other chest pain: Secondary | ICD-10-CM | POA: Diagnosis not present

## 2022-09-13 DIAGNOSIS — I5022 Chronic systolic (congestive) heart failure: Secondary | ICD-10-CM | POA: Diagnosis not present

## 2022-09-17 DIAGNOSIS — I130 Hypertensive heart and chronic kidney disease with heart failure and stage 1 through stage 4 chronic kidney disease, or unspecified chronic kidney disease: Secondary | ICD-10-CM | POA: Diagnosis not present

## 2022-09-17 DIAGNOSIS — S72001D Fracture of unspecified part of neck of right femur, subsequent encounter for closed fracture with routine healing: Secondary | ICD-10-CM | POA: Diagnosis not present

## 2022-09-17 DIAGNOSIS — D631 Anemia in chronic kidney disease: Secondary | ICD-10-CM | POA: Diagnosis not present

## 2022-09-17 DIAGNOSIS — I6529 Occlusion and stenosis of unspecified carotid artery: Secondary | ICD-10-CM | POA: Diagnosis not present

## 2022-09-17 DIAGNOSIS — Z9181 History of falling: Secondary | ICD-10-CM | POA: Diagnosis not present

## 2022-09-17 DIAGNOSIS — I480 Paroxysmal atrial fibrillation: Secondary | ICD-10-CM | POA: Diagnosis not present

## 2022-09-17 DIAGNOSIS — I5022 Chronic systolic (congestive) heart failure: Secondary | ICD-10-CM | POA: Diagnosis not present

## 2022-09-17 DIAGNOSIS — K219 Gastro-esophageal reflux disease without esophagitis: Secondary | ICD-10-CM | POA: Diagnosis not present

## 2022-09-17 DIAGNOSIS — S2241XD Multiple fractures of ribs, right side, subsequent encounter for fracture with routine healing: Secondary | ICD-10-CM | POA: Diagnosis not present

## 2022-09-17 DIAGNOSIS — R7303 Prediabetes: Secondary | ICD-10-CM | POA: Diagnosis not present

## 2022-09-17 DIAGNOSIS — N184 Chronic kidney disease, stage 4 (severe): Secondary | ICD-10-CM | POA: Diagnosis not present

## 2022-09-17 DIAGNOSIS — S42301D Unspecified fracture of shaft of humerus, right arm, subsequent encounter for fracture with routine healing: Secondary | ICD-10-CM | POA: Diagnosis not present

## 2022-09-17 DIAGNOSIS — J45909 Unspecified asthma, uncomplicated: Secondary | ICD-10-CM | POA: Diagnosis not present

## 2022-09-17 DIAGNOSIS — I2510 Atherosclerotic heart disease of native coronary artery without angina pectoris: Secondary | ICD-10-CM | POA: Diagnosis not present

## 2022-09-19 DIAGNOSIS — T466X5A Adverse effect of antihyperlipidemic and antiarteriosclerotic drugs, initial encounter: Secondary | ICD-10-CM | POA: Diagnosis not present

## 2022-09-19 DIAGNOSIS — I700 Atherosclerosis of aorta: Secondary | ICD-10-CM | POA: Diagnosis not present

## 2022-09-19 DIAGNOSIS — D692 Other nonthrombocytopenic purpura: Secondary | ICD-10-CM | POA: Diagnosis not present

## 2022-09-19 DIAGNOSIS — M791 Myalgia, unspecified site: Secondary | ICD-10-CM | POA: Diagnosis not present

## 2022-09-19 DIAGNOSIS — I10 Essential (primary) hypertension: Secondary | ICD-10-CM | POA: Diagnosis not present

## 2022-09-19 DIAGNOSIS — I480 Paroxysmal atrial fibrillation: Secondary | ICD-10-CM | POA: Diagnosis not present

## 2022-09-19 DIAGNOSIS — R7303 Prediabetes: Secondary | ICD-10-CM | POA: Diagnosis not present

## 2022-09-19 DIAGNOSIS — I255 Ischemic cardiomyopathy: Secondary | ICD-10-CM | POA: Diagnosis not present

## 2022-09-23 DIAGNOSIS — S2241XD Multiple fractures of ribs, right side, subsequent encounter for fracture with routine healing: Secondary | ICD-10-CM | POA: Diagnosis not present

## 2022-09-23 DIAGNOSIS — K219 Gastro-esophageal reflux disease without esophagitis: Secondary | ICD-10-CM | POA: Diagnosis not present

## 2022-09-23 DIAGNOSIS — L298 Other pruritus: Secondary | ICD-10-CM | POA: Diagnosis not present

## 2022-09-23 DIAGNOSIS — I6529 Occlusion and stenosis of unspecified carotid artery: Secondary | ICD-10-CM | POA: Diagnosis not present

## 2022-09-23 DIAGNOSIS — I2510 Atherosclerotic heart disease of native coronary artery without angina pectoris: Secondary | ICD-10-CM | POA: Diagnosis not present

## 2022-09-23 DIAGNOSIS — L821 Other seborrheic keratosis: Secondary | ICD-10-CM | POA: Diagnosis not present

## 2022-09-23 DIAGNOSIS — I130 Hypertensive heart and chronic kidney disease with heart failure and stage 1 through stage 4 chronic kidney disease, or unspecified chronic kidney disease: Secondary | ICD-10-CM | POA: Diagnosis not present

## 2022-09-23 DIAGNOSIS — S42301D Unspecified fracture of shaft of humerus, right arm, subsequent encounter for fracture with routine healing: Secondary | ICD-10-CM | POA: Diagnosis not present

## 2022-09-23 DIAGNOSIS — N184 Chronic kidney disease, stage 4 (severe): Secondary | ICD-10-CM | POA: Diagnosis not present

## 2022-09-23 DIAGNOSIS — I5022 Chronic systolic (congestive) heart failure: Secondary | ICD-10-CM | POA: Diagnosis not present

## 2022-09-23 DIAGNOSIS — D485 Neoplasm of uncertain behavior of skin: Secondary | ICD-10-CM | POA: Diagnosis not present

## 2022-09-23 DIAGNOSIS — R7303 Prediabetes: Secondary | ICD-10-CM | POA: Diagnosis not present

## 2022-09-23 DIAGNOSIS — S72001D Fracture of unspecified part of neck of right femur, subsequent encounter for closed fracture with routine healing: Secondary | ICD-10-CM | POA: Diagnosis not present

## 2022-09-23 DIAGNOSIS — Z9181 History of falling: Secondary | ICD-10-CM | POA: Diagnosis not present

## 2022-09-23 DIAGNOSIS — J45909 Unspecified asthma, uncomplicated: Secondary | ICD-10-CM | POA: Diagnosis not present

## 2022-09-23 DIAGNOSIS — I480 Paroxysmal atrial fibrillation: Secondary | ICD-10-CM | POA: Diagnosis not present

## 2022-09-23 DIAGNOSIS — D631 Anemia in chronic kidney disease: Secondary | ICD-10-CM | POA: Diagnosis not present

## 2022-09-23 DIAGNOSIS — C44629 Squamous cell carcinoma of skin of left upper limb, including shoulder: Secondary | ICD-10-CM | POA: Diagnosis not present

## 2022-09-24 DIAGNOSIS — S2241XD Multiple fractures of ribs, right side, subsequent encounter for fracture with routine healing: Secondary | ICD-10-CM | POA: Diagnosis not present

## 2022-09-24 DIAGNOSIS — N184 Chronic kidney disease, stage 4 (severe): Secondary | ICD-10-CM | POA: Diagnosis not present

## 2022-09-24 DIAGNOSIS — Z961 Presence of intraocular lens: Secondary | ICD-10-CM | POA: Diagnosis not present

## 2022-09-24 DIAGNOSIS — S72001D Fracture of unspecified part of neck of right femur, subsequent encounter for closed fracture with routine healing: Secondary | ICD-10-CM | POA: Diagnosis not present

## 2022-09-24 DIAGNOSIS — D631 Anemia in chronic kidney disease: Secondary | ICD-10-CM | POA: Diagnosis not present

## 2022-09-24 DIAGNOSIS — I130 Hypertensive heart and chronic kidney disease with heart failure and stage 1 through stage 4 chronic kidney disease, or unspecified chronic kidney disease: Secondary | ICD-10-CM | POA: Diagnosis not present

## 2022-09-24 DIAGNOSIS — S42301D Unspecified fracture of shaft of humerus, right arm, subsequent encounter for fracture with routine healing: Secondary | ICD-10-CM | POA: Diagnosis not present

## 2022-09-24 DIAGNOSIS — I5022 Chronic systolic (congestive) heart failure: Secondary | ICD-10-CM | POA: Diagnosis not present

## 2022-09-26 DIAGNOSIS — S42301D Unspecified fracture of shaft of humerus, right arm, subsequent encounter for fracture with routine healing: Secondary | ICD-10-CM | POA: Diagnosis not present

## 2022-09-26 DIAGNOSIS — I130 Hypertensive heart and chronic kidney disease with heart failure and stage 1 through stage 4 chronic kidney disease, or unspecified chronic kidney disease: Secondary | ICD-10-CM | POA: Diagnosis not present

## 2022-09-26 DIAGNOSIS — D631 Anemia in chronic kidney disease: Secondary | ICD-10-CM | POA: Diagnosis not present

## 2022-09-26 DIAGNOSIS — I5022 Chronic systolic (congestive) heart failure: Secondary | ICD-10-CM | POA: Diagnosis not present

## 2022-09-26 DIAGNOSIS — I480 Paroxysmal atrial fibrillation: Secondary | ICD-10-CM | POA: Diagnosis not present

## 2022-09-26 DIAGNOSIS — Z9181 History of falling: Secondary | ICD-10-CM | POA: Diagnosis not present

## 2022-09-26 DIAGNOSIS — I6529 Occlusion and stenosis of unspecified carotid artery: Secondary | ICD-10-CM | POA: Diagnosis not present

## 2022-09-26 DIAGNOSIS — I2510 Atherosclerotic heart disease of native coronary artery without angina pectoris: Secondary | ICD-10-CM | POA: Diagnosis not present

## 2022-09-26 DIAGNOSIS — S72001D Fracture of unspecified part of neck of right femur, subsequent encounter for closed fracture with routine healing: Secondary | ICD-10-CM | POA: Diagnosis not present

## 2022-09-26 DIAGNOSIS — N184 Chronic kidney disease, stage 4 (severe): Secondary | ICD-10-CM | POA: Diagnosis not present

## 2022-09-26 DIAGNOSIS — K219 Gastro-esophageal reflux disease without esophagitis: Secondary | ICD-10-CM | POA: Diagnosis not present

## 2022-09-26 DIAGNOSIS — J45909 Unspecified asthma, uncomplicated: Secondary | ICD-10-CM | POA: Diagnosis not present

## 2022-09-26 DIAGNOSIS — S2241XD Multiple fractures of ribs, right side, subsequent encounter for fracture with routine healing: Secondary | ICD-10-CM | POA: Diagnosis not present

## 2022-09-26 DIAGNOSIS — R7303 Prediabetes: Secondary | ICD-10-CM | POA: Diagnosis not present

## 2022-09-27 DIAGNOSIS — Z9181 History of falling: Secondary | ICD-10-CM | POA: Diagnosis not present

## 2022-09-27 DIAGNOSIS — I6529 Occlusion and stenosis of unspecified carotid artery: Secondary | ICD-10-CM | POA: Diagnosis not present

## 2022-09-27 DIAGNOSIS — I5022 Chronic systolic (congestive) heart failure: Secondary | ICD-10-CM | POA: Diagnosis not present

## 2022-09-27 DIAGNOSIS — K219 Gastro-esophageal reflux disease without esophagitis: Secondary | ICD-10-CM | POA: Diagnosis not present

## 2022-09-27 DIAGNOSIS — S72001D Fracture of unspecified part of neck of right femur, subsequent encounter for closed fracture with routine healing: Secondary | ICD-10-CM | POA: Diagnosis not present

## 2022-09-27 DIAGNOSIS — I480 Paroxysmal atrial fibrillation: Secondary | ICD-10-CM | POA: Diagnosis not present

## 2022-09-27 DIAGNOSIS — D631 Anemia in chronic kidney disease: Secondary | ICD-10-CM | POA: Diagnosis not present

## 2022-09-27 DIAGNOSIS — J45909 Unspecified asthma, uncomplicated: Secondary | ICD-10-CM | POA: Diagnosis not present

## 2022-09-27 DIAGNOSIS — I130 Hypertensive heart and chronic kidney disease with heart failure and stage 1 through stage 4 chronic kidney disease, or unspecified chronic kidney disease: Secondary | ICD-10-CM | POA: Diagnosis not present

## 2022-09-27 DIAGNOSIS — N184 Chronic kidney disease, stage 4 (severe): Secondary | ICD-10-CM | POA: Diagnosis not present

## 2022-09-27 DIAGNOSIS — S2241XD Multiple fractures of ribs, right side, subsequent encounter for fracture with routine healing: Secondary | ICD-10-CM | POA: Diagnosis not present

## 2022-09-27 DIAGNOSIS — I2510 Atherosclerotic heart disease of native coronary artery without angina pectoris: Secondary | ICD-10-CM | POA: Diagnosis not present

## 2022-09-27 DIAGNOSIS — R7303 Prediabetes: Secondary | ICD-10-CM | POA: Diagnosis not present

## 2022-09-27 DIAGNOSIS — S42301D Unspecified fracture of shaft of humerus, right arm, subsequent encounter for fracture with routine healing: Secondary | ICD-10-CM | POA: Diagnosis not present

## 2022-10-11 DIAGNOSIS — C44629 Squamous cell carcinoma of skin of left upper limb, including shoulder: Secondary | ICD-10-CM | POA: Diagnosis not present

## 2022-10-11 DIAGNOSIS — I4891 Unspecified atrial fibrillation: Secondary | ICD-10-CM | POA: Diagnosis not present

## 2022-10-11 DIAGNOSIS — I10 Essential (primary) hypertension: Secondary | ICD-10-CM | POA: Diagnosis not present

## 2022-10-17 DIAGNOSIS — S72141A Displaced intertrochanteric fracture of right femur, initial encounter for closed fracture: Secondary | ICD-10-CM | POA: Diagnosis not present

## 2022-10-17 DIAGNOSIS — I700 Atherosclerosis of aorta: Secondary | ICD-10-CM | POA: Diagnosis not present

## 2022-10-17 DIAGNOSIS — M85811 Other specified disorders of bone density and structure, right shoulder: Secondary | ICD-10-CM | POA: Diagnosis not present

## 2022-10-17 DIAGNOSIS — S42201A Unspecified fracture of upper end of right humerus, initial encounter for closed fracture: Secondary | ICD-10-CM | POA: Diagnosis not present

## 2022-10-17 DIAGNOSIS — S42211D Unspecified displaced fracture of surgical neck of right humerus, subsequent encounter for fracture with routine healing: Secondary | ICD-10-CM | POA: Diagnosis not present

## 2022-10-17 DIAGNOSIS — S72141D Displaced intertrochanteric fracture of right femur, subsequent encounter for closed fracture with routine healing: Secondary | ICD-10-CM | POA: Diagnosis not present

## 2022-10-17 DIAGNOSIS — S42201D Unspecified fracture of upper end of right humerus, subsequent encounter for fracture with routine healing: Secondary | ICD-10-CM | POA: Diagnosis not present

## 2022-10-17 DIAGNOSIS — Z66 Do not resuscitate: Secondary | ICD-10-CM | POA: Diagnosis not present

## 2022-10-28 DIAGNOSIS — L03115 Cellulitis of right lower limb: Secondary | ICD-10-CM | POA: Diagnosis not present

## 2022-10-28 DIAGNOSIS — W010XXA Fall on same level from slipping, tripping and stumbling without subsequent striking against object, initial encounter: Secondary | ICD-10-CM | POA: Diagnosis not present

## 2022-10-28 DIAGNOSIS — S81801A Unspecified open wound, right lower leg, initial encounter: Secondary | ICD-10-CM | POA: Diagnosis not present

## 2022-10-28 DIAGNOSIS — Y92009 Unspecified place in unspecified non-institutional (private) residence as the place of occurrence of the external cause: Secondary | ICD-10-CM | POA: Diagnosis not present

## 2022-10-28 DIAGNOSIS — L089 Local infection of the skin and subcutaneous tissue, unspecified: Secondary | ICD-10-CM | POA: Diagnosis not present

## 2023-01-08 DIAGNOSIS — I779 Disorder of arteries and arterioles, unspecified: Secondary | ICD-10-CM | POA: Diagnosis not present

## 2023-01-08 DIAGNOSIS — R7303 Prediabetes: Secondary | ICD-10-CM | POA: Diagnosis not present

## 2023-01-08 DIAGNOSIS — I700 Atherosclerosis of aorta: Secondary | ICD-10-CM | POA: Diagnosis not present

## 2023-01-08 DIAGNOSIS — T466X5A Adverse effect of antihyperlipidemic and antiarteriosclerotic drugs, initial encounter: Secondary | ICD-10-CM | POA: Diagnosis not present

## 2023-01-08 DIAGNOSIS — M791 Myalgia, unspecified site: Secondary | ICD-10-CM | POA: Diagnosis not present

## 2023-01-08 DIAGNOSIS — D692 Other nonthrombocytopenic purpura: Secondary | ICD-10-CM | POA: Diagnosis not present

## 2023-01-08 DIAGNOSIS — J4520 Mild intermittent asthma, uncomplicated: Secondary | ICD-10-CM | POA: Diagnosis not present

## 2023-01-08 DIAGNOSIS — I480 Paroxysmal atrial fibrillation: Secondary | ICD-10-CM | POA: Diagnosis not present

## 2023-01-08 DIAGNOSIS — N1831 Chronic kidney disease, stage 3a: Secondary | ICD-10-CM | POA: Diagnosis not present

## 2023-01-08 DIAGNOSIS — Z9989 Dependence on other enabling machines and devices: Secondary | ICD-10-CM | POA: Diagnosis not present

## 2023-01-08 DIAGNOSIS — I255 Ischemic cardiomyopathy: Secondary | ICD-10-CM | POA: Diagnosis not present

## 2023-01-08 DIAGNOSIS — Z0000 Encounter for general adult medical examination without abnormal findings: Secondary | ICD-10-CM | POA: Diagnosis not present

## 2023-01-14 ENCOUNTER — Emergency Department
Admission: EM | Admit: 2023-01-14 | Discharge: 2023-01-14 | Disposition: A | Discharge status: Home / Self Care | Payer: PPO | Attending: Emergency Medicine | Admitting: Emergency Medicine

## 2023-01-14 ENCOUNTER — Emergency Department: Payer: PPO

## 2023-01-14 ENCOUNTER — Encounter: Payer: Self-pay | Admitting: Emergency Medicine

## 2023-01-14 ENCOUNTER — Other Ambulatory Visit: Payer: Self-pay

## 2023-01-14 DIAGNOSIS — I4891 Unspecified atrial fibrillation: Secondary | ICD-10-CM | POA: Insufficient documentation

## 2023-01-14 DIAGNOSIS — I110 Hypertensive heart disease with heart failure: Secondary | ICD-10-CM | POA: Diagnosis not present

## 2023-01-14 DIAGNOSIS — I509 Heart failure, unspecified: Secondary | ICD-10-CM | POA: Diagnosis not present

## 2023-01-14 DIAGNOSIS — I951 Orthostatic hypotension: Secondary | ICD-10-CM | POA: Insufficient documentation

## 2023-01-14 DIAGNOSIS — R079 Chest pain, unspecified: Secondary | ICD-10-CM | POA: Diagnosis not present

## 2023-01-14 DIAGNOSIS — I2510 Atherosclerotic heart disease of native coronary artery without angina pectoris: Secondary | ICD-10-CM | POA: Insufficient documentation

## 2023-01-14 DIAGNOSIS — J929 Pleural plaque without asbestos: Secondary | ICD-10-CM | POA: Diagnosis not present

## 2023-01-14 DIAGNOSIS — I517 Cardiomegaly: Secondary | ICD-10-CM | POA: Diagnosis not present

## 2023-01-14 DIAGNOSIS — I10 Essential (primary) hypertension: Secondary | ICD-10-CM | POA: Insufficient documentation

## 2023-01-14 DIAGNOSIS — R0789 Other chest pain: Secondary | ICD-10-CM | POA: Diagnosis not present

## 2023-01-14 DIAGNOSIS — R42 Dizziness and giddiness: Secondary | ICD-10-CM | POA: Insufficient documentation

## 2023-01-14 DIAGNOSIS — K449 Diaphragmatic hernia without obstruction or gangrene: Secondary | ICD-10-CM | POA: Diagnosis not present

## 2023-01-14 LAB — BRAIN NATRIURETIC PEPTIDE: B Natriuretic Peptide: 347.4 pg/mL — ABNORMAL HIGH (ref 0.0–100.0)

## 2023-01-14 LAB — CBC
HCT: 42.2 % (ref 36.0–46.0)
Hemoglobin: 13.4 g/dL (ref 12.0–15.0)
MCH: 30.9 pg (ref 26.0–34.0)
MCHC: 31.8 g/dL (ref 30.0–36.0)
MCV: 97.2 fL (ref 80.0–100.0)
Platelets: 198 10*3/uL (ref 150–400)
RBC: 4.34 MIL/uL (ref 3.87–5.11)
RDW: 12.4 % (ref 11.5–15.5)
WBC: 7.6 10*3/uL (ref 4.0–10.5)
nRBC: 0 % (ref 0.0–0.2)

## 2023-01-14 LAB — COMPREHENSIVE METABOLIC PANEL
ALT: 8 U/L (ref 0–44)
AST: 14 U/L — ABNORMAL LOW (ref 15–41)
Albumin: 3.2 g/dL — ABNORMAL LOW (ref 3.5–5.0)
Alkaline Phosphatase: 68 U/L (ref 38–126)
Anion gap: 8 (ref 5–15)
BUN: 28 mg/dL — ABNORMAL HIGH (ref 8–23)
CO2: 26 mmol/L (ref 22–32)
Calcium: 8.5 mg/dL — ABNORMAL LOW (ref 8.9–10.3)
Chloride: 101 mmol/L (ref 98–111)
Creatinine, Ser: 0.81 mg/dL (ref 0.44–1.00)
GFR, Estimated: >60 mL/min (ref >60)
Glucose, Bld: 125 mg/dL — ABNORMAL HIGH (ref 70–99)
Potassium: 4.6 mmol/L (ref 3.5–5.1)
Sodium: 135 mmol/L (ref 135–145)
Total Bilirubin: 0.6 mg/dL (ref 0.3–1.2)
Total Protein: 6.9 g/dL (ref 6.5–8.1)

## 2023-01-14 LAB — TROPONIN I (HIGH SENSITIVITY)
Troponin I (High Sensitivity): 8 ng/L (ref <18)
Troponin I (High Sensitivity): 10 ng/L (ref <18)

## 2023-01-14 LAB — LIPASE, BLOOD: Lipase: 31 U/L (ref 11–51)

## 2023-01-14 MED ORDER — NITROGLYCERIN 0.4 MG SL SUBL: 0.4000 mg | SUBLINGUAL_TABLET | SUBLINGUAL | Status: DC | PRN

## 2023-01-14 MED ORDER — ASPIRIN 81 MG PO CHEW
324.0000 mg | CHEWABLE_TABLET | Freq: Once | ORAL | Status: AC
  Administered 2023-01-14: 324 mg via ORAL
  Filled 2023-01-14: qty 4

## 2023-01-14 NOTE — ED Triage Notes (Signed)
First Nurse Note:  Pt via POV from [facility]. Pt c/o substernal CP that started this AM, pt took Nitroglycerin and shortly after son stated pt had a syncopal episode. VSS reported pt hypertensive at 182/88.

## 2023-01-14 NOTE — ED Provider Notes (Signed)
[facility] Provider Note    Event Date/Time   First MD Initiated Contact with Patient 01/14/23 1020     (approximate)   History   Chest Pain   HPI  Anna Barrett is a 89 y.o. female   Past medical history of CAD, atrial fibrillation not on anticoagulation, aortic atherosclerosis, hyperlipidemia, hypertension, who presents to the emergency department with transient chest pain.  She has occasional chest pain longstanding, intermittent, at rest, and a morning cough which is chronic and unchanged.  She also has esophageal stricture status post dilation in February.  She takes omeprazole.  This morning she had a typical episode of her chest pain after eating breakfast and getting ready to use the bathroom and take a bath when the chest pain developed..  However it was longer lasting which was concerning.  Her son administered 1 nitroglycerin after which the chest pain resolved, they proceeded to go to the bathroom to take a bath, and upon standing she felt lightheaded and had a near syncope episode but never lost consciousness.  This was short-lived and she became back to baseline with no further dizziness nor chest pain.  They came to the emergency department afterwards.  Otherwise been in her regular state of health and no acute medical complaints or illnesses.  Independent Historian contributed to assessment above: Her son is at the bedside corroborates information given above  External Medical Documents Reviewed: CT scan of the chest and abdomen/pelvis from February 2024 which showed no dilation of the intrathoracic or abdominal aorta's, also on endoscopy note from February 2024 documenting esophageal stenosis status post dilation.      Physical Exam   Triage Vital Signs: ED Triage Vitals  Encounter Vitals Group     BP 01/14/23 1015 (!) 181/96     Systolic BP Percentile --      Diastolic BP Percentile --      Pulse Rate 01/14/23 1015 93     Resp  01/14/23 1015 18     Temp 01/14/23 1015 (!) 97.4 F (36.3 C)     Temp Source 01/14/23 1015 Oral     SpO2 01/14/23 1015 93 %     Weight 01/14/23 1013 90 lb 13.3 oz (41.2 kg)     Height 01/14/23 1013 5\' 1"  (1.549 m)     Head Circumference --      Peak Flow --      Pain Score 01/14/23 1013 0     Pain Loc --      Pain Education --      Exclude from Growth Chart --     Most recent vital signs: Vitals:   01/14/23 1330 01/14/23 1500  BP: (!) 175/61 (!) 156/74  Pulse: 94 88  Resp: 20 (!) 22  Temp:    SpO2: 100% 93%    General: Awake, no distress.  CV:  Good peripheral perfusion.  Resp:  Normal effort.  Abd:  No distention.  Other:  Initially hypertensive 180s over 90s but on quick recheck in the room improved systolic blood pressure in the 150s.  Pleasant woman in no acute distress.  Appears euvolemic, soft nontender abdomen and clear lungs.  Radial pulses intact and equal bilaterally.   ED Results / Procedures / Treatments   Labs (all labs ordered are listed, but only abnormal results are displayed) Labs Reviewed  COMPREHENSIVE METABOLIC PANEL - Abnormal; Notable for the following components:      Result Value   Glucose,  Bld 125 (*)    BUN 28 (*)    Calcium 8.5 (*)    Albumin 3.2 (*)    AST 14 (*)    All other components within normal limits  BRAIN NATRIURETIC PEPTIDE - Abnormal; Notable for the following components:   B Natriuretic Peptide 347.4 (*)    All other components within normal limits  CBC  LIPASE, BLOOD  TROPONIN I (HIGH SENSITIVITY)  TROPONIN I (HIGH SENSITIVITY)     I ordered and reviewed the above labs they are notable for neg troponin x 2.  EKG  ED ECG REPORT I, Pilar Jarvis, the attending physician, personally viewed and interpreted this ECG.   Date: 01/14/2023  EKG Time: 1019  Rate: 88  Rhythm: sinus  Axis: nl  Intervals:lbbb  ST&T Change: no stemi     RADIOLOGY I independently reviewed and interpreted x-ray of the chest and see no  focality or pneumothorax I also reviewed radiologist's formal read.   PROCEDURES:  Critical Care performed: No  Procedures   MEDICATIONS ORDERED IN ED: Medications  nitroGLYCERIN (NITROSTAT) SL tablet 0.4 mg (has no administration in time range)  aspirin chewable tablet 324 mg (324 mg Oral Given 01/14/23 1133)    IMPRESSION / MDM / ASSESSMENT AND PLAN / ED COURSE  I reviewed the triage vital signs and the nursing notes.                                Patient's presentation is most consistent with acute presentation with potential threat to life or bodily function.  Differential diagnosis includes, but is not limited to, ACS, dissection, PE, pneumothorax, GERD/dyspepsia, dysrhythmia, electrolyte disturbance or dehydration, adverse effect of nitroglycerin   The patient is on the cardiac monitor to evaluate for evidence of arrhythmia and/or significant heart rate changes.  MDM:    I think that her chest discomfort this morning after eating most likely due to GERD or dyspepsia, at first not concerning to the patient nor son given longstanding nature of similar episodes, transient nature.  I also consider ACS given her age and risk factors and previous CAD history, EKG is nonischemic, will follow-up with serial troponins.  Given aspirin.  Symptomatology not consistent with PE or dissection.  I think that her subsequent presyncopal episode that was transient was likely due to the nitroglycerin administration.  Check basic labs for electrolyte derangements, EKG for dysrhythmia, she has no focal neurologic deficits currently to suggest stroke.  She feels completely better now and is eager to go home.  She will stay for our testing as above, serial troponins, and if remains asymptomatic with normal testing plan will be for discharge home and PMD follow-up.       FINAL CLINICAL IMPRESSION(S) / ED DIAGNOSES   Final diagnoses:  Nonspecific chest pain  Orthostatic lightheadedness      Rx / DC Orders   ED Discharge Orders     None        Note:  This document was prepared using Dragon voice recognition software and may include unintentional dictation errors.    Pilar Jarvis, MD 01/14/23 [phone]

## 2023-01-14 NOTE — ED Triage Notes (Signed)
Patient to ED via POV from [facility] for CP that started this AM. States she had CP this AM- took a nitroglycerin at home and had a near syncopal episode. Also having dry heaving at home. Hx of CHF. Denies pain at this time.

## 2023-01-16 DIAGNOSIS — Z4789 Encounter for other orthopedic aftercare: Secondary | ICD-10-CM | POA: Diagnosis not present

## 2023-01-16 DIAGNOSIS — S72141A Displaced intertrochanteric fracture of right femur, initial encounter for closed fracture: Secondary | ICD-10-CM | POA: Diagnosis not present

## 2023-01-16 DIAGNOSIS — S72141D Displaced intertrochanteric fracture of right femur, subsequent encounter for closed fracture with routine healing: Secondary | ICD-10-CM | POA: Diagnosis not present

## 2023-02-11 DIAGNOSIS — T466X5A Adverse effect of antihyperlipidemic and antiarteriosclerotic drugs, initial encounter: Secondary | ICD-10-CM | POA: Diagnosis not present

## 2023-02-11 DIAGNOSIS — I480 Paroxysmal atrial fibrillation: Secondary | ICD-10-CM | POA: Diagnosis not present

## 2023-02-11 DIAGNOSIS — R7303 Prediabetes: Secondary | ICD-10-CM | POA: Diagnosis not present

## 2023-02-11 DIAGNOSIS — N1831 Chronic kidney disease, stage 3a: Secondary | ICD-10-CM | POA: Diagnosis not present

## 2023-02-11 DIAGNOSIS — I10 Essential (primary) hypertension: Secondary | ICD-10-CM | POA: Diagnosis not present

## 2023-02-11 DIAGNOSIS — J4520 Mild intermittent asthma, uncomplicated: Secondary | ICD-10-CM | POA: Diagnosis not present

## 2023-02-11 DIAGNOSIS — I700 Atherosclerosis of aorta: Secondary | ICD-10-CM | POA: Diagnosis not present

## 2023-02-11 DIAGNOSIS — I255 Ischemic cardiomyopathy: Secondary | ICD-10-CM | POA: Diagnosis not present

## 2023-02-11 DIAGNOSIS — I779 Disorder of arteries and arterioles, unspecified: Secondary | ICD-10-CM | POA: Diagnosis not present

## 2023-02-11 DIAGNOSIS — D692 Other nonthrombocytopenic purpura: Secondary | ICD-10-CM | POA: Diagnosis not present

## 2023-02-11 DIAGNOSIS — M791 Myalgia, unspecified site: Secondary | ICD-10-CM | POA: Diagnosis not present

## 2023-03-05 DIAGNOSIS — L578 Other skin changes due to chronic exposure to nonionizing radiation: Secondary | ICD-10-CM | POA: Diagnosis not present

## 2023-03-05 DIAGNOSIS — L821 Other seborrheic keratosis: Secondary | ICD-10-CM | POA: Diagnosis not present

## 2023-03-05 DIAGNOSIS — Z859 Personal history of malignant neoplasm, unspecified: Secondary | ICD-10-CM | POA: Diagnosis not present

## 2023-03-05 DIAGNOSIS — L570 Actinic keratosis: Secondary | ICD-10-CM | POA: Diagnosis not present

## 2023-03-05 DIAGNOSIS — Z872 Personal history of diseases of the skin and subcutaneous tissue: Secondary | ICD-10-CM | POA: Diagnosis not present

## 2023-03-12 DIAGNOSIS — J069 Acute upper respiratory infection, unspecified: Secondary | ICD-10-CM | POA: Diagnosis not present

## 2023-03-12 DIAGNOSIS — Z03818 Encounter for observation for suspected exposure to other biological agents ruled out: Secondary | ICD-10-CM | POA: Diagnosis not present

## 2023-04-17 DIAGNOSIS — L570 Actinic keratosis: Secondary | ICD-10-CM | POA: Diagnosis not present

## 2023-04-17 DIAGNOSIS — L821 Other seborrheic keratosis: Secondary | ICD-10-CM | POA: Diagnosis not present

## 2023-05-08 DIAGNOSIS — R112 Nausea with vomiting, unspecified: Secondary | ICD-10-CM | POA: Diagnosis not present

## 2023-05-08 DIAGNOSIS — Z03818 Encounter for observation for suspected exposure to other biological agents ruled out: Secondary | ICD-10-CM | POA: Diagnosis not present

## 2023-05-08 DIAGNOSIS — R197 Diarrhea, unspecified: Secondary | ICD-10-CM | POA: Diagnosis not present

## 2023-06-18 DIAGNOSIS — L821 Other seborrheic keratosis: Secondary | ICD-10-CM | POA: Diagnosis not present

## 2023-06-18 DIAGNOSIS — L570 Actinic keratosis: Secondary | ICD-10-CM | POA: Diagnosis not present

## 2023-06-18 DIAGNOSIS — Z872 Personal history of diseases of the skin and subcutaneous tissue: Secondary | ICD-10-CM | POA: Diagnosis not present

## 2023-09-05 DIAGNOSIS — I5020 Unspecified systolic (congestive) heart failure: Secondary | ICD-10-CM | POA: Diagnosis not present

## 2023-09-05 DIAGNOSIS — T466X5A Adverse effect of antihyperlipidemic and antiarteriosclerotic drugs, initial encounter: Secondary | ICD-10-CM | POA: Diagnosis not present

## 2023-09-05 DIAGNOSIS — I10 Essential (primary) hypertension: Secondary | ICD-10-CM | POA: Diagnosis not present

## 2023-09-05 DIAGNOSIS — R7303 Prediabetes: Secondary | ICD-10-CM | POA: Diagnosis not present

## 2023-09-05 DIAGNOSIS — I480 Paroxysmal atrial fibrillation: Secondary | ICD-10-CM | POA: Diagnosis not present

## 2023-09-05 DIAGNOSIS — N184 Chronic kidney disease, stage 4 (severe): Secondary | ICD-10-CM | POA: Diagnosis not present

## 2023-09-05 DIAGNOSIS — I779 Disorder of arteries and arterioles, unspecified: Secondary | ICD-10-CM | POA: Diagnosis not present

## 2023-09-05 DIAGNOSIS — G720 Drug-induced myopathy: Secondary | ICD-10-CM | POA: Diagnosis not present

## 2023-09-05 DIAGNOSIS — I700 Atherosclerosis of aorta: Secondary | ICD-10-CM | POA: Diagnosis not present

## 2023-09-05 DIAGNOSIS — J4520 Mild intermittent asthma, uncomplicated: Secondary | ICD-10-CM | POA: Diagnosis not present

## 2023-09-05 DIAGNOSIS — N1831 Chronic kidney disease, stage 3a: Secondary | ICD-10-CM | POA: Diagnosis not present

## 2023-09-30 DIAGNOSIS — H353131 Nonexudative age-related macular degeneration, bilateral, early dry stage: Secondary | ICD-10-CM | POA: Diagnosis not present

## 2023-09-30 DIAGNOSIS — Z961 Presence of intraocular lens: Secondary | ICD-10-CM | POA: Diagnosis not present

## 2023-10-14 DIAGNOSIS — Z872 Personal history of diseases of the skin and subcutaneous tissue: Secondary | ICD-10-CM | POA: Diagnosis not present

## 2023-10-14 DIAGNOSIS — L570 Actinic keratosis: Secondary | ICD-10-CM | POA: Diagnosis not present

## 2023-10-14 DIAGNOSIS — L578 Other skin changes due to chronic exposure to nonionizing radiation: Secondary | ICD-10-CM | POA: Diagnosis not present

## 2023-10-14 DIAGNOSIS — L821 Other seborrheic keratosis: Secondary | ICD-10-CM | POA: Diagnosis not present

## 2023-10-14 DIAGNOSIS — Z859 Personal history of malignant neoplasm, unspecified: Secondary | ICD-10-CM | POA: Diagnosis not present

## 2023-10-28 ENCOUNTER — Emergency Department

## 2023-10-28 ENCOUNTER — Inpatient Hospital Stay
Admission: EM | Admit: 2023-10-28 | Discharge: 2023-10-30 | DRG: 193 | Disposition: A | Discharge status: Home Health | Attending: Hospitalist | Admitting: Hospitalist

## 2023-10-28 DIAGNOSIS — I252 Old myocardial infarction: Secondary | ICD-10-CM

## 2023-10-28 DIAGNOSIS — I5023 Acute on chronic systolic (congestive) heart failure: Secondary | ICD-10-CM | POA: Diagnosis not present

## 2023-10-28 DIAGNOSIS — Z888 Allergy status to other drugs, medicaments and biological substances status: Secondary | ICD-10-CM | POA: Diagnosis not present

## 2023-10-28 DIAGNOSIS — I255 Ischemic cardiomyopathy: Secondary | ICD-10-CM | POA: Diagnosis present

## 2023-10-28 DIAGNOSIS — Z8249 Family history of ischemic heart disease and other diseases of the circulatory system: Secondary | ICD-10-CM

## 2023-10-28 DIAGNOSIS — I2510 Atherosclerotic heart disease of native coronary artery without angina pectoris: Secondary | ICD-10-CM | POA: Diagnosis not present

## 2023-10-28 DIAGNOSIS — Z681 Body mass index (BMI) 19 or less, adult: Secondary | ICD-10-CM | POA: Diagnosis not present

## 2023-10-28 DIAGNOSIS — R0602 Shortness of breath: Principal | ICD-10-CM

## 2023-10-28 DIAGNOSIS — Z7982 Long term (current) use of aspirin: Secondary | ICD-10-CM | POA: Diagnosis not present

## 2023-10-28 DIAGNOSIS — Z91013 Allergy to seafood: Secondary | ICD-10-CM

## 2023-10-28 DIAGNOSIS — I509 Heart failure, unspecified: Secondary | ICD-10-CM | POA: Diagnosis not present

## 2023-10-28 DIAGNOSIS — I2489 Other forms of acute ischemic heart disease: Secondary | ICD-10-CM | POA: Diagnosis present

## 2023-10-28 DIAGNOSIS — I130 Hypertensive heart and chronic kidney disease with heart failure and stage 1 through stage 4 chronic kidney disease, or unspecified chronic kidney disease: Secondary | ICD-10-CM | POA: Diagnosis present

## 2023-10-28 DIAGNOSIS — Z66 Do not resuscitate: Secondary | ICD-10-CM | POA: Diagnosis not present

## 2023-10-28 DIAGNOSIS — E43 Unspecified severe protein-calorie malnutrition: Secondary | ICD-10-CM | POA: Diagnosis not present

## 2023-10-28 DIAGNOSIS — I5A Non-ischemic myocardial injury (non-traumatic): Secondary | ICD-10-CM | POA: Diagnosis not present

## 2023-10-28 DIAGNOSIS — I482 Chronic atrial fibrillation, unspecified: Secondary | ICD-10-CM | POA: Diagnosis not present

## 2023-10-28 DIAGNOSIS — N1831 Chronic kidney disease, stage 3a: Secondary | ICD-10-CM | POA: Diagnosis not present

## 2023-10-28 DIAGNOSIS — Z91041 Radiographic dye allergy status: Secondary | ICD-10-CM

## 2023-10-28 DIAGNOSIS — J45909 Unspecified asthma, uncomplicated: Secondary | ICD-10-CM | POA: Diagnosis present

## 2023-10-28 DIAGNOSIS — E876 Hypokalemia: Secondary | ICD-10-CM | POA: Diagnosis not present

## 2023-10-28 DIAGNOSIS — E785 Hyperlipidemia, unspecified: Secondary | ICD-10-CM | POA: Diagnosis present

## 2023-10-28 DIAGNOSIS — Z90710 Acquired absence of both cervix and uterus: Secondary | ICD-10-CM | POA: Diagnosis not present

## 2023-10-28 DIAGNOSIS — R918 Other nonspecific abnormal finding of lung field: Secondary | ICD-10-CM | POA: Diagnosis not present

## 2023-10-28 DIAGNOSIS — R64 Cachexia: Secondary | ICD-10-CM | POA: Diagnosis present

## 2023-10-28 DIAGNOSIS — I10 Essential (primary) hypertension: Secondary | ICD-10-CM | POA: Diagnosis not present

## 2023-10-28 DIAGNOSIS — R0989 Other specified symptoms and signs involving the circulatory and respiratory systems: Secondary | ICD-10-CM | POA: Diagnosis not present

## 2023-10-28 DIAGNOSIS — Z79899 Other long term (current) drug therapy: Secondary | ICD-10-CM

## 2023-10-28 DIAGNOSIS — J90 Pleural effusion, not elsewhere classified: Secondary | ICD-10-CM | POA: Diagnosis not present

## 2023-10-28 DIAGNOSIS — J189 Pneumonia, unspecified organism: Principal | ICD-10-CM | POA: Diagnosis present

## 2023-10-28 DIAGNOSIS — J168 Pneumonia due to other specified infectious organisms: Secondary | ICD-10-CM | POA: Diagnosis not present

## 2023-10-28 DIAGNOSIS — J984 Other disorders of lung: Secondary | ICD-10-CM | POA: Diagnosis not present

## 2023-10-28 LAB — CBC
HCT: 37.6 % (ref 36.0–46.0)
Hemoglobin: 12.1 g/dL (ref 12.0–15.0)
MCH: 29.9 pg (ref 26.0–34.0)
MCHC: 32.2 g/dL (ref 30.0–36.0)
MCV: 92.8 fL (ref 80.0–100.0)
Platelets: 187 10*3/uL (ref 150–400)
RBC: 4.05 MIL/uL (ref 3.87–5.11)
RDW: 13.2 % (ref 11.5–15.5)
WBC: 10.6 10*3/uL — ABNORMAL HIGH (ref 4.0–10.5)
nRBC: 0 % (ref 0.0–0.2)

## 2023-10-28 LAB — BRAIN NATRIURETIC PEPTIDE: B Natriuretic Peptide: 1013.7 pg/mL — ABNORMAL HIGH (ref 0.0–100.0)

## 2023-10-28 LAB — BASIC METABOLIC PANEL WITH GFR
Anion gap: 9 (ref 5–15)
BUN: 33 mg/dL — ABNORMAL HIGH (ref 8–23)
CO2: 26 mmol/L (ref 22–32)
Calcium: 8.4 mg/dL — ABNORMAL LOW (ref 8.9–10.3)
Chloride: 102 mmol/L (ref 98–111)
Creatinine, Ser: 1.12 mg/dL — ABNORMAL HIGH (ref 0.44–1.00)
GFR, Estimated: 44 mL/min — ABNORMAL LOW (ref >60)
Glucose, Bld: 159 mg/dL — ABNORMAL HIGH (ref 70–99)
Potassium: 3.6 mmol/L (ref 3.5–5.1)
Sodium: 137 mmol/L (ref 135–145)

## 2023-10-28 LAB — TROPONIN I (HIGH SENSITIVITY)
Troponin I (High Sensitivity): 25 ng/L — ABNORMAL HIGH (ref <18)
Troponin I (High Sensitivity): 37 ng/L — ABNORMAL HIGH (ref <18)

## 2023-10-28 LAB — LACTIC ACID, PLASMA: Lactic Acid, Venous: 1.5 mmol/L (ref 0.5–1.9)

## 2023-10-28 LAB — MAGNESIUM: Magnesium: 1.9 mg/dL (ref 1.7–2.4)

## 2023-10-28 LAB — PROCALCITONIN: Procalcitonin: 2.56 ng/mL

## 2023-10-28 MED ORDER — ASPIRIN 81 MG PO TBEC
81.0000 mg | DELAYED_RELEASE_TABLET | Freq: Every day | ORAL | Status: DC
  Administered 2023-10-29 – 2023-10-30 (×2): 81 mg via ORAL
  Filled 2023-10-28: qty 1

## 2023-10-28 MED ORDER — IPRATROPIUM-ALBUTEROL 0.5-2.5 (3) MG/3ML IN SOLN
3.0000 mL | Freq: Four times a day (QID) | RESPIRATORY_TRACT | Status: DC
  Administered 2023-10-28: 3 mL via RESPIRATORY_TRACT
  Filled 2023-10-28: qty 3

## 2023-10-28 MED ORDER — FLUTICASONE FUROATE-VILANTEROL 100-25 MCG/ACT IN AEPB
1.0000 | INHALATION_SPRAY | Freq: Every day | RESPIRATORY_TRACT | Status: DC
  Filled 2023-10-28 (×3): qty 28

## 2023-10-28 MED ORDER — FUROSEMIDE 10 MG/ML IJ SOLN
40.0000 mg | Freq: Once | INTRAMUSCULAR | Status: DC
  Filled 2023-10-28: qty 4

## 2023-10-28 MED ORDER — FUROSEMIDE 10 MG/ML IJ SOLN
40.0000 mg | Freq: Two times a day (BID) | INTRAMUSCULAR | Status: DC
  Administered 2023-10-28: 40 mg via INTRAVENOUS

## 2023-10-28 MED ORDER — CARVEDILOL 3.125 MG PO TABS
3.1250 mg | ORAL_TABLET | Freq: Two times a day (BID) | ORAL | Status: DC
  Administered 2023-10-29 – 2023-10-30 (×3): 3.125 mg via ORAL
  Filled 2023-10-28 (×3): qty 1

## 2023-10-28 MED ORDER — ALBUTEROL SULFATE (2.5 MG/3ML) 0.083% IN NEBU: 2.5000 mL | INHALATION_SOLUTION | RESPIRATORY_TRACT | Status: DC | PRN

## 2023-10-28 MED ORDER — ENSURE ENLIVE PO LIQD
237.0000 mL | Freq: Three times a day (TID) | ORAL | Status: DC
  Administered 2023-10-28 – 2023-10-30 (×5): 237 mL via ORAL
  Filled 2023-10-28: qty 237

## 2023-10-28 MED ORDER — DM-GUAIFENESIN ER 30-600 MG PO TB12: 1.0000 | ORAL_TABLET | Freq: Two times a day (BID) | ORAL | Status: DC | PRN

## 2023-10-28 MED ORDER — VITAMIN D 25 MCG (1000 UNIT) PO TABS
2000.0000 [IU] | ORAL_TABLET | Freq: Every day | ORAL | Status: DC
  Administered 2023-10-29 – 2023-10-30 (×2): 2000 [IU] via ORAL
  Filled 2023-10-28 (×2): qty 2

## 2023-10-28 MED ORDER — SODIUM CHLORIDE 0.9 % IV SOLN
1.0000 g | Freq: Once | INTRAVENOUS | Status: AC
  Administered 2023-10-28: 1 g via INTRAVENOUS
  Filled 2023-10-28: qty 10

## 2023-10-28 MED ORDER — FUROSEMIDE 10 MG/ML IJ SOLN
20.0000 mg | Freq: Two times a day (BID) | INTRAMUSCULAR | Status: DC
  Filled 2023-10-28: qty 4

## 2023-10-28 MED ORDER — ENOXAPARIN SODIUM 30 MG/0.3ML IJ SOSY
30.0000 mg | PREFILLED_SYRINGE | INTRAMUSCULAR | Status: DC
  Administered 2023-10-28 – 2023-10-29 (×2): 30 mg via SUBCUTANEOUS
  Filled 2023-10-28 (×2): qty 0.3

## 2023-10-28 MED ORDER — SENNA 8.6 MG PO TABS
1.0000 | ORAL_TABLET | Freq: Two times a day (BID) | ORAL | Status: DC
  Administered 2023-10-28 – 2023-10-30 (×4): 8.6 mg via ORAL
  Filled 2023-10-28 (×5): qty 1

## 2023-10-28 MED ORDER — SODIUM CHLORIDE 0.9 % IV SOLN
500.0000 mg | Freq: Once | INTRAVENOUS | Status: AC
  Administered 2023-10-28: 500 mg via INTRAVENOUS
  Filled 2023-10-28: qty 5

## 2023-10-28 MED ORDER — NITROGLYCERIN 0.4 MG SL SUBL: 0.4000 mg | SUBLINGUAL_TABLET | SUBLINGUAL | Status: DC | PRN

## 2023-10-28 MED ORDER — PANTOPRAZOLE SODIUM 40 MG PO TBEC
40.0000 mg | DELAYED_RELEASE_TABLET | Freq: Every day | ORAL | Status: DC
  Administered 2023-10-29 – 2023-10-30 (×2): 40 mg via ORAL
  Filled 2023-10-28 (×2): qty 1

## 2023-10-28 MED ORDER — SODIUM CHLORIDE 0.9 % IV SOLN
1.0000 g | INTRAVENOUS | Status: DC
  Administered 2023-10-29: 1 g via INTRAVENOUS
  Filled 2023-10-28 (×2): qty 10

## 2023-10-28 MED ORDER — SODIUM CHLORIDE 0.9 % IV SOLN
500.0000 mg | INTRAVENOUS | Status: DC
  Administered 2023-10-29: 500 mg via INTRAVENOUS
  Filled 2023-10-28 (×2): qty 5

## 2023-10-28 MED ORDER — IPRATROPIUM-ALBUTEROL 0.5-2.5 (3) MG/3ML IN SOLN
3.0000 mL | Freq: Once | RESPIRATORY_TRACT | Status: AC
  Administered 2023-10-28: 3 mL via RESPIRATORY_TRACT
  Filled 2023-10-28: qty 3

## 2023-10-28 MED ORDER — HYDRALAZINE HCL 20 MG/ML IJ SOLN: 5.0000 mg | INTRAMUSCULAR | Status: DC | PRN

## 2023-10-28 MED ORDER — FLUTICASONE PROPIONATE 50 MCG/ACT NA SUSP
1.0000 | Freq: Every day | NASAL | Status: DC
  Filled 2023-10-28: qty 16

## 2023-10-28 MED ORDER — ACETAMINOPHEN 325 MG PO TABS: 650.0000 mg | ORAL_TABLET | Freq: Four times a day (QID) | ORAL | Status: DC | PRN

## 2023-10-28 MED ORDER — ONDANSETRON HCL 4 MG/2ML IJ SOLN: 4.0000 mg | Freq: Three times a day (TID) | INTRAMUSCULAR | Status: DC | PRN

## 2023-10-28 NOTE — ED Triage Notes (Signed)
 Pt to ED via POV from home. Family reports pt was outside and when she came inside started to have SOB and was diaphoretic. Pt denies CP.   Pt labored and diaphoretic on arrival with RA 91%. Pt placed on 2L [place]

## 2023-10-28 NOTE — H&P (Signed)
 History and Physical    Anna Barrett ZOX:[id number] DOB: 1922-06-18 DOA: 10/28/2023  Referring MD/NP/PA:   PCP: Jimmy Moulding, MD   Patient coming from:  The patient is coming from home.     Chief Complaint: SOB  HPI: Anna Barrett is a 89 y.o. female with medical history significant of sCHF with EF 30-35%, HTN,  HLD, CAD, MI, GERD, CKD-3A, A-fib not on anticoagulants, anemia, who presents with SOB.  Patient states that her shortness of breath started today, which has been progressively worsening.  Patient has cough with clear mucus production.  No chest pain, fever or chills.  Patient is not using oxygen normally; is found to have oxygen saturation 91% on room air, which improved to 95% on 2 L oxygen in ED.  Patient has nausea, and slightly vomited with clear liquid earlier.  Her nausea and vomiting have resolved currently.  No abdominal pain or diarrhea.  No symptoms of UTI.  Per her daughter, patient has orthopnea in the past several days.   Data reviewed independently and ED Course: pt was found to have BNP 1013, WBC 10.6, procalcitonin 2.56, troponin 25 --> 37, lactic acid 1.5, renal function close to baseline.  Temperature normal, blood pressure 124/49, heart rate 121--> 99, RR 34, oxygen saturation 95% on 2 L oxygen.  Patient is admitted to telemetry bed as inpatient.  Chest x-ray: Cardiomegaly with vascular congestion and small right greater than left pleural effusions. Right greater than left basilar airspace disease, atelectasis versus pneumonia.    EKG: I have personally reviewed.  Sinus rhythm, QTc 498, poor R wave question, anteroseptal infarction pattern, widening QRS wave.   Review of Systems:   General: no fevers, chills, no body weight gain, has fatigue HEENT: no blurry vision, hearing changes or sore throat Respiratory: has dyspnea, coughing, no wheezing CV: no chest pain, no palpitations GI: had nausea, vomiting, no abdominal pain, diarrhea,  constipation GU: no dysuria, burning on urination, increased urinary frequency, hematuria  Ext: has mild leg edema Neuro: no unilateral weakness, numbness, or tingling, no vision change or hearing loss Skin: no rash, no skin tear. MSK: No muscle spasm, no deformity, no limitation of range of movement in spin Heme: No easy bruising.  Travel history: No recent long distant travel.   Allergy:  Allergies  Allergen Reactions   Shellfish Allergy Other (See Comments) and Hives   Atorvastatin Other (See Comments)   Iodine Other (See Comments)    Past Medical History:  Diagnosis Date   Asthma    Carotid stenosis    Headache    MIGRAINES   History of MI (myocardial infarction)    Hyperglycemia    Hyperlipidemia    Hypertension    Ischemic cardiomyopathy    Myocardial infarction (HCC) 1962   Osteoporosis    Parathyroid adenoma     Past Surgical History:  Procedure Laterality Date   ABDOMINAL HYSTERECTOMY     APPENDECTOMY     CATARACT EXTRACTION Bilateral    ESOPHAGOGASTRODUODENOSCOPY (EGD) WITH PROPOFOL  N/A 04/10/2020   Procedure: ESOPHAGOGASTRODUODENOSCOPY (EGD) WITH PROPOFOL ;  Surgeon: Toledo, Alphonsus Jeans, MD;  Location: [facility] ENDOSCOPY;  Service: Gastroenterology;  Laterality: N/A;   ESOPHAGOGASTRODUODENOSCOPY (EGD) WITH PROPOFOL  N/A 06/06/2021   Procedure: ESOPHAGOGASTRODUODENOSCOPY (EGD) WITH PROPOFOL ;  Surgeon: Luke Salaam, MD;  Location: [facility] ENDOSCOPY;  Service: Gastroenterology;  Laterality: N/A;   ESOPHAGOGASTRODUODENOSCOPY (EGD) WITH PROPOFOL  N/A 06/21/2021   Procedure: ESOPHAGOGASTRODUODENOSCOPY (EGD) WITH PROPOFOL ;  Surgeon: Toledo, Alphonsus Jeans, MD;  Location: [facility] ENDOSCOPY;  Service: Gastroenterology;  Laterality: N/A;   EYE SURGERY     FRACTURE SURGERY     ORIF ANKLE FRACTURE Left 04/17/2015   Procedure: OPEN REDUCTION INTERNAL FIXATION (ORIF) ANKLE FRACTURE;  Surgeon: Elner Hahn, MD;  Location: [facility] ORS;  Service: Orthopedics;  Laterality: Left;    Social History:   reports that she has never smoked. She has never used smokeless tobacco. She reports that she does not drink alcohol and does not use drugs.  Family History:  Family History  Problem Relation Age of Onset   Heart disease Mother    Heart disease Father      Prior to Admission medications   Medication Sig Start Date End Date Taking? Authorizing Provider  albuterol  (VENTOLIN  HFA) 108 (90 Base) MCG/ACT inhaler Inhale 2 puffs into the lungs every 6 (six) hours as needed for wheezing or shortness of breath. 06/03/21   Loman Risk, MD  aspirin  EC 325 MG tablet Take 1 tablet (325 mg total) by mouth daily. 04/19/15   Rex Castor, MD  azelastine  (ASTELIN ) 0.1 % nasal spray Place 1 spray into both nostrils 2 (two) times daily. 06/06/21 06/06/22  Montey Apa, DO  chlorpheniramine-HYDROcodone (TUSSIONEX PENNKINETIC ER) 10-8 MG/5ML Take 2.5 mLs by mouth every 12 (twelve) hours as needed for cough. 06/03/21   Norlene Beavers, MD  Cholecalciferol  50 MCG (2000 UT) TABS Take 2,000 Units by mouth daily.    [provider]  cloNIDine  (CATAPRES  - DOSED IN MG/24 HR) 0.1 mg/24hr patch Place 0.1 mg onto the skin once a week.    [provider]  feeding supplement (ENSURE ENLIVE / ENSURE PLUS) LIQD Take 237 mLs by mouth 3 (three) times daily between meals. 06/06/21   Montey Apa, DO  ferrous sulfate  325 (65 FE) MG tablet Take 1 tablet (325 mg total) by mouth 2 (two) times daily with a meal. 04/19/15   Rex Castor, MD  ipratropium-albuterol  (DUONEB) 0.5-2.5 (3) MG/3ML SOLN Take 3 mLs by nebulization every 4 (four) hours as needed for up to 20 days (SOB). 09/10/20 06/05/21  Isa Manuel, MD  nystatin (MYCOSTATIN) 100000 UNIT/ML suspension Take 5 mLs by mouth 4 (four) times daily.    [provider]  omeprazole (PRILOSEC) 10 MG capsule Take 10 mg by mouth daily.    [provider]  ondansetron  (ZOFRAN -ODT) 4 MG disintegrating tablet Allow 1-2 tablets to dissolve  in your mouth every 8 hours as needed for nausea/vomiting 05/18/22   Lynnda Sas, MD    Physical Exam: Vitals:   10/28/23 1745 10/28/23 1800 10/28/23 1926 10/28/23 2030  BP:  (!) 124/49  (!) 107/48  Pulse:  99  94  Resp: (!) 34   19  Temp:    98.7 F (37.1 C)  TempSrc:    Oral  SpO2:  92%  98%  Weight:   38.6 kg   Height:   5' 1 (1.549 m)    General: Not in acute distress.  Thin body habitus HEENT:       Eyes: PERRL, EOMI, no jaundice       ENT: No discharge from the ears and nose, no pharynx injection, no tonsillar enlargement.        Neck: Has positive JVD, no bruit, no mass felt. Heme: No neck lymph node enlargement. Cardiac: S1/S2, RRR, No murmurs, No gallops or rubs. Respiratory: Has fine crackles bilaterally, no wheezing GI: Soft, nondistended, nontender, no rebound pain, no organomegaly, BS present. GU: No hematuria Ext: Has  1+ pitting leg edema bilaterally. 1+DP/PT pulse bilaterally. Musculoskeletal: No joint deformities, No joint redness or warmth, no limitation of ROM in spin. Skin: No rashes.  Neuro: Alert, oriented X3, cranial nerves II-XII grossly intact, moves all extremities normally.  Psych: Patient is not psychotic, no suicidal or hemocidal ideation.  Labs on Admission: I have personally reviewed following labs and imaging studies  CBC: Recent Labs  Lab 10/28/23 1651  WBC 10.6*  HGB 12.1  HCT 37.6  MCV 92.8  PLT 187   Basic Metabolic Panel: Recent Labs  Lab 10/28/23 1651 10/28/23 1834  NA 137  --   K 3.6  --   CL 102  --   CO2 26  --   GLUCOSE 159*  --   BUN 33*  --   CREATININE 1.12*  --   CALCIUM 8.4*  --   MG  --  1.9   GFR: Estimated Creatinine Clearance: 17.1 mL/min (A) (by C-G formula based on SCr of 1.12 mg/dL (H)). Liver Function Tests: No results for input(s): AST, ALT, ALKPHOS, BILITOT, PROT, ALBUMIN in the last 168 hours. No results for input(s): LIPASE, AMYLASE in the last 168 hours. No results for input(s):  AMMONIA in the last 168 hours. Coagulation Profile: No results for input(s): INR, PROTIME in the last 168 hours. Cardiac Enzymes: No results for input(s): CKTOTAL, CKMB, CKMBINDEX, TROPONINI in the last 168 hours. BNP (last 3 results) No results for input(s): PROBNP in the last 8760 hours. HbA1C: No results for input(s): HGBA1C in the last 72 hours. CBG: No results for input(s): GLUCAP in the last 168 hours. Lipid Profile: No results for input(s): CHOL, HDL, LDLCALC, TRIG, CHOLHDL, LDLDIRECT in the last 72 hours. Thyroid  Function Tests: No results for input(s): TSH, T4TOTAL, FREET4, T3FREE, THYROIDAB in the last 72 hours. Anemia Panel: No results for input(s): VITAMINB12, FOLATE, FERRITIN, TIBC, IRON, RETICCTPCT in the last 72 hours. Urine analysis: No results found for: COLORURINE, APPEARANCEUR, LABSPEC, PHURINE, GLUCOSEU, HGBUR, BILIRUBINUR, KETONESUR, PROTEINUR, UROBILINOGEN, NITRITE, LEUKOCYTESUR Sepsis Labs: @LABRCNTIP (procalcitonin:4,lacticidven:4) )No results found for this or any previous visit (from the past 240 hours).   Radiological Exams on Admission:   Assessment/Plan Principal Problem:   Acute on chronic systolic CHF (congestive heart failure) (HCC) Active Problems:   CAP (community acquired pneumonia)   CAD (coronary artery disease)   Myocardial injury   Hypertension   Asthma, chronic   Chronic kidney disease, stage 3a (HCC)   Atrial fibrillation, chronic (HCC)   Protein-calorie malnutrition, severe (HCC)   Assessment and Plan:  Acute on chronic systolic CHF (congestive heart failure) (HCC): Patient has SOB, positive JVD, significantly elevated BNP 1013, orthopnea, case in-service CHF exacerbation.  2D echo on 07/07/2022 showed EF 30 to 35%.  Patient has 2 L new oxygen requirement, but no acute respiratory distress.   -Will admit to tele bed as inpatient -Place in tele bed   for  observation - give one dose of Lasix  40 mg --> then 20 mg bid -2d echo -Daily weights -strict I/O's -Low salt diet -Fluid restriction -As needed bronchodilators for shortness of breath  CAP (community acquired pneumonia): Chest x-ray showed basilar opacity.  Patient does not have fever; only has minimal leukocytosis with WBC 10.6, but her procalcitonin is elevated 2.56;  cannot completely rule out pneumonia. - IV Rocephin  and azithromycin  - Mucinex for cough  - Bronchodilators - Urine legionella and S. pneumococcal antigen - Follow up blood culture x2, sputum culture - Check RSEP panel  CAD (coronary  artery disease) and Myocardial injury: trop  25 --> 37, No CP, likely demand ischemia. - Trend troponin - Aspirin  81 mg daily - Follow-up 2D echo  Hypertension -IV hydralazine  as needed - Continue Coreg  Asthma, chronic: No wheezing -As needed Mucinex - Bronchodilators  Chronic kidney disease, stage 3a (HCC): Renal function close to baseline.  Recent baseline creatinine 0.8-1.1.  Her creatinine is 1.12, BUN 33, GFR 44 -Follow-up with BMP  Atrial fibrillation, chronic (HCC): Heart rate 121--> 90s - Continue Coreg -Patient is not taking anticoagulants currently  Protein-calorie malnutrition, severe (HCC): Body weight 33.6 kg, BMI 16.06 - Ensure - Nutrition consult       DVT ppx: SQ Lovenox   Code Status: DNR per pt and her grand daughter and grandson  Family Communication:  Yes, patient's grand daughter and grandson    at bed side.     Disposition Plan:  Anticipate discharge back to previous environment  Consults called: None  Admission status and Level of care: Telemetry Cardiac: as inpt        Dispo: The patient is from: Home              Anticipated d/c is to: Home              Anticipated d/c date is: 2 days              Patient currently is not medically stable to d/c.    Severity of Illness:  The appropriate patient status for this patient is  INPATIENT. Inpatient status is judged to be reasonable and necessary in order to provide the required intensity of service to ensure the patient's safety. The patient's presenting symptoms, physical exam findings, and initial radiographic and laboratory data in the context of their chronic comorbidities is felt to place them at high risk for further clinical deterioration. Furthermore, it is not anticipated that the patient will be medically stable for discharge from the hospital within 2 midnights of admission.   * I certify that at the point of admission it is my clinical judgment that the patient will require inpatient hospital care spanning beyond 2 midnights from the point of admission due to high intensity of service, high risk for further deterioration and high frequency of surveillance required.*       Date of Service 10/28/2023    Fidencio Hue [facility]   If 7PM-7AM, please contact night-coverage [email or web address] 10/28/2023, 9:22 PM

## 2023-10-28 NOTE — ED Provider Notes (Signed)
 [facility] Provider Note    Event Date/Time   First MD Initiated Contact with Patient 10/28/23 1655     (approximate)   History   Shortness of Breath   HPI  Anna Barrett is a 89 y.o. female   who presents to the emergency department today because of concerns for shortness of breath and sweating.  Patient is unable to give significant history at this time.  History is primarily obtained from family at bedside.  They state that the patient had gone outside with family member then within a few minutes started having shortness of breath and breathing difficulty.  They did try to cool the patient off however she continued to have breathing difficulty.  Patient does not answer when asked about chest pain.  No reported fevers recently.  Family member did state that the patient was up a lot last night going to the bathroom.      Physical Exam   Triage Vital Signs: ED Triage Vitals  Encounter Vitals Group     BP 10/28/23 1646 (!) 170/57     Girls Systolic BP Percentile --      Girls Diastolic BP Percentile --      Boys Systolic BP Percentile --      Boys Diastolic BP Percentile --      Pulse Rate 10/28/23 1646 (!) 108     Resp 10/28/23 1646 (!) 26     Temp 10/28/23 1649 98.9 F (37.2 C)     Temp Source 10/28/23 1649 Oral     SpO2 10/28/23 1646 91 %     Weight --      Height --      Head Circumference --      Peak Flow --      Pain Score 10/28/23 1646 0     Pain Loc --      Pain Education --      Exclude from Growth Chart --     Most recent vital signs: Vitals:   10/28/23 1646 10/28/23 1649  BP: (!) 170/57   Pulse: (!) 108   Resp: (!) 26   Temp:  98.9 F (37.2 C)  SpO2: 91%    General: Awake, alert, not completely oriented. CV:  Good peripheral perfusion. Tachycardia. Resp:  Increased work of breathing, poor air movement diffusely. Abd:  No distention.  Other:  No lower extremity edema.    ED Results / Procedures / Treatments    Labs (all labs ordered are listed, but only abnormal results are displayed) Labs Reviewed  BASIC METABOLIC PANEL WITH GFR - Abnormal; Notable for the following components:      Result Value   Glucose, Bld 159 (*)    BUN 33 (*)    Creatinine, Ser 1.12 (*)    Calcium 8.4 (*)    GFR, Estimated 44 (*)    All other components within normal limits  CBC - Abnormal; Notable for the following components:   WBC 10.6 (*)    All other components within normal limits  BRAIN NATRIURETIC PEPTIDE - Abnormal; Notable for the following components:   B Natriuretic Peptide 1,013.7 (*)    All other components within normal limits  TROPONIN I (HIGH SENSITIVITY) - Abnormal; Notable for the following components:   Troponin I (High Sensitivity) 25 (*)    All other components within normal limits  TROPONIN I (HIGH SENSITIVITY) - Abnormal; Notable for the following components:   Troponin I (High Sensitivity) 37 (*)  All other components within normal limits  CULTURE, BLOOD (ROUTINE X 2)  CULTURE, BLOOD (ROUTINE X 2)  EXPECTORATED SPUTUM ASSESSMENT W GRAM STAIN, RFLX TO RESP C  RESP PANEL BY RT-PCR (RSV, FLU A&B, COVID)  RVPGX2  LACTIC ACID, PLASMA  PROCALCITONIN  MAGNESIUM   BASIC METABOLIC PANEL WITH GFR  CBC  LEGIONELLA PNEUMOPHILA SEROGP 1 UR AG  STREP PNEUMONIAE URINARY ANTIGEN     EKG  I, Marylynn Soho, attending physician, personally viewed and interpreted this EKG  EKG Time: 1649 Rate: 110 Rhythm: sinus tachycardia Axis: normal Intervals: qtc 498 QRS: LVH, QRS widening ST changes: no st elevation Impression: abnormal ekg   RADIOLOGY I independently interpreted and visualized the CXR. My interpretation: Right sided pneumonia Radiology interpretation:  IMPRESSION:  Cardiomegaly with vascular congestion and small right greater than  left pleural effusions. Right greater than left basilar airspace  disease, atelectasis versus pneumonia.      PROCEDURES:  Critical Care  performed: Yes  CRITICAL CARE Performed by: Marylynn Soho   Total critical care time: 30 minutes  Critical care time was exclusive of separately billable procedures and treating other patients.  Critical care was necessary to treat or prevent imminent or life-threatening deterioration.  Critical care was time spent personally by me on the following activities: development of treatment plan with patient and/or surrogate as well as nursing, discussions with consultants, evaluation of patient's response to treatment, examination of patient, obtaining history from patient or surrogate, ordering and performing treatments and interventions, ordering and review of laboratory studies, ordering and review of radiographic studies, pulse oximetry and re-evaluation of patient's condition.   Procedures    MEDICATIONS ORDERED IN ED: Medications - No data to display   IMPRESSION / MDM / ASSESSMENT AND PLAN / ED COURSE  I reviewed the triage vital signs and the nursing notes.                              Differential diagnosis includes, but is not limited to, asthma, pneumonia, viral illness, CHF  Patient's presentation is most consistent with acute presentation with potential threat to life or bodily function.   The patient is on the cardiac monitor to evaluate for evidence of arrhythmia and/or significant heart rate changes.  Patient presented to the emerged department today accompanied by family because of concerns for shortness of breath.  On exam patient does have increased work of breathing and poor air movement.  I did not appreciate any wheezing.  Broad workup was initiated.  Additionally patient was given DuoNeb treatment.  Patient did improve after DuoNeb treatment.  Chest x-ray was read as possible pneumonia versus atelectasis.  Did have concern for pneumonia on my read.  Patient was started on antibiotics.  Additionally BNP was noted to be elevated.  Patient was also given Lasix .   Discussed with Dr. Rosalea Collin with the hospitalist service who will evaluate for admission.      FINAL CLINICAL IMPRESSION(S) / ED DIAGNOSES   Final diagnoses:  Shortness of breath  Pneumonia due to infectious organism, unspecified laterality, unspecified part of lung         Note:  This document was prepared using Dragon voice recognition software and may include unintentional dictation errors.    Marylynn Soho, MD 10/28/23 2158

## 2023-10-29 ENCOUNTER — Inpatient Hospital Stay: Admit: 2023-10-29

## 2023-10-29 ENCOUNTER — Other Ambulatory Visit: Payer: Self-pay

## 2023-10-29 DIAGNOSIS — I5023 Acute on chronic systolic (congestive) heart failure: Secondary | ICD-10-CM | POA: Diagnosis not present

## 2023-10-29 LAB — BASIC METABOLIC PANEL WITH GFR
Anion gap: 6 (ref 5–15)
BUN: 36 mg/dL — ABNORMAL HIGH (ref 8–23)
CO2: 25 mmol/L (ref 22–32)
Calcium: 7.4 mg/dL — ABNORMAL LOW (ref 8.9–10.3)
Chloride: 108 mmol/L (ref 98–111)
Creatinine, Ser: 1.13 mg/dL — ABNORMAL HIGH (ref 0.44–1.00)
GFR, Estimated: 44 mL/min — ABNORMAL LOW (ref >60)
Glucose, Bld: 106 mg/dL — ABNORMAL HIGH (ref 70–99)
Potassium: 3.3 mmol/L — ABNORMAL LOW (ref 3.5–5.1)
Sodium: 139 mmol/L (ref 135–145)

## 2023-10-29 LAB — TROPONIN I (HIGH SENSITIVITY)
Troponin I (High Sensitivity): 32 ng/L — ABNORMAL HIGH (ref <18)
Troponin I (High Sensitivity): 38 ng/L — ABNORMAL HIGH (ref <18)
Troponin I (High Sensitivity): 39 ng/L — ABNORMAL HIGH (ref <18)
Troponin I (High Sensitivity): 41 ng/L — ABNORMAL HIGH (ref <18)

## 2023-10-29 LAB — CBC
HCT: 29.4 % — ABNORMAL LOW (ref 36.0–46.0)
Hemoglobin: 9.4 g/dL — ABNORMAL LOW (ref 12.0–15.0)
MCH: 30.5 pg (ref 26.0–34.0)
MCHC: 32 g/dL (ref 30.0–36.0)
MCV: 95.5 fL (ref 80.0–100.0)
Platelets: 141 10*3/uL — ABNORMAL LOW (ref 150–400)
RBC: 3.08 MIL/uL — ABNORMAL LOW (ref 3.87–5.11)
RDW: 13.5 % (ref 11.5–15.5)
WBC: 13.3 10*3/uL — ABNORMAL HIGH (ref 4.0–10.5)
nRBC: 0 % (ref 0.0–0.2)

## 2023-10-29 LAB — STREP PNEUMONIAE URINARY ANTIGEN: Strep Pneumo Urinary Antigen: NEGATIVE

## 2023-10-29 LAB — RESP PANEL BY RT-PCR (RSV, FLU A&B, COVID)  RVPGX2
Influenza A by PCR: NEGATIVE
Influenza B by PCR: NEGATIVE
Resp Syncytial Virus by PCR: NEGATIVE
SARS Coronavirus 2 by RT PCR: NEGATIVE

## 2023-10-29 MED ORDER — FUROSEMIDE 10 MG/ML IJ SOLN
40.0000 mg | Freq: Every day | INTRAMUSCULAR | Status: DC
  Administered 2023-10-29 – 2023-10-30 (×2): 40 mg via INTRAVENOUS
  Filled 2023-10-29: qty 4

## 2023-10-29 MED ORDER — POTASSIUM CHLORIDE 20 MEQ PO PACK
40.0000 meq | PACK | Freq: Once | ORAL | Status: AC
  Administered 2023-10-29: 40 meq via ORAL
  Filled 2023-10-29: qty 2

## 2023-10-29 NOTE — ED Notes (Signed)
 Dr. Rosalea Collin made aware of decrease in patients blood pressure and increase in respirations. Anna Barrett is asleep. Woke Anna Barrett up, Anna Barrett is alert and oriented x 4 with no complaints. Instructed to hold any blood pressure medication and diuretic at this time.

## 2023-10-29 NOTE — TOC Initial Note (Addendum)
 Transition of Care [facility]) - Initial/Assessment Note    Patient Details  Name: Anna Barrett MRN: [id number] Date of Birth: 1922-06-07  Transition of Care [facility]) CM/SW Contact:    Elsie Halo, RN Phone Number: 10/29/2023, 11:13 AM  Clinical Narrative:                 TOC met with the patient and her granddaughter and grandson in the patient's room. The patient is from home with her family. Per the granddaughter the family takes turns sitting with the patient ensuring that she is never alone. The patient has DME: walker, shower chair, raised commode, and wheelchair at home. Her PCP is Dr. Alva Jewels and she uses [facility] in [place].  The patient has a new O2 need, she is currently on 2 L [place].  FL2 completed and PASRR obtained [id number].  TOC will continue to follow for dc planning needs.   Expected Discharge Plan: Home/Self Care (Family provides around the clock care) Barriers to Discharge: Continued Medical Work up   Patient Goals and CMS Choice            Expected Discharge Plan and Services   Discharge Planning Services: CM Consult   Living arrangements for the past 2 months: Single Family Home                                      Prior Living Arrangements/Services Living arrangements for the past 2 months: Single Family Home Lives with:: Adult Children (Children and Grandchildren ensure the patient is never alone in her home)                   Activities of Daily Living      Permission Sought/Granted                  Emotional Assessment Appearance:: Appears stated age Attitude/Demeanor/Rapport: Engaged Affect (typically observed): Appropriate Orientation: : Oriented to Self   Psych Involvement: No (comment)  Admission diagnosis:  Acute on chronic diastolic heart failure (HCC) [I50.33] Acute on chronic systolic CHF (congestive heart failure) (HCC) [I50.23] Patient Active Problem List   Diagnosis Date Noted   Acute on chronic  systolic CHF (congestive heart failure) (HCC) 10/28/2023   Asthma, chronic 10/28/2023   CAD (coronary artery disease) 10/28/2023   Myocardial injury 10/28/2023   Chronic kidney disease, stage 3a (HCC) 10/28/2023   Atrial fibrillation, chronic (HCC) 10/28/2023   CAP (community acquired pneumonia) 10/28/2023   Protein-calorie malnutrition, severe (HCC) 10/28/2023   Dysphagia 06/07/2021   Esophageal stricture 06/05/2021   Asthma without status asthmaticus 06/03/2017   Carotid artery disease (HCC) 06/03/2017   Hypercholesterolemia with hypertriglyceridemia 06/03/2017   Hyperglycemia, unspecified 06/03/2017   Hypertension 06/03/2017   Ischemic cardiomyopathy 06/03/2017   Parathyroid adenoma 06/03/2017   Aortic atherosclerosis (HCC) 12/24/2016   Closed left ankle fracture 04/16/2015   Senile purpura (HCC) 01/10/2015   CKD (chronic kidney disease) stage 3, GFR 30-59 ml/min (HCC) 09/28/2013   A-fib (HCC) 09/24/2013   PCP:  Jimmy Moulding, MD Pharmacy:   [facility] [phone] [place], [place] - [address] AT [facility] OF [address] [place] [address] [place] [address] Phone: [phone] Fax: [phone]     Social Drivers of Health (SDOH) Social History: SDOH Screenings   Food Insecurity: No Food Insecurity (02/11/2023)   [facility] from [facility]  Housing: Unknown (09/02/2023)   Received from [facility]  Transportation Needs: No Transportation Needs (02/11/2023)   Received from [facility]  Utilities: Not At Risk (02/11/2023)   Received from [facility]  Financial Resource Strain: Low Risk  (02/11/2023)   Received from [facility]  Tobacco Use: Low Risk  (10/28/2023)   SDOH Interventions:     Readmission Risk Interventions     No data to display

## 2023-10-29 NOTE — Progress Notes (Signed)
  PROGRESS NOTE    Anna Barrett  ZOX:[id number] DOB: 02-08-1923 DOA: 10/28/2023 PCP: Jimmy Moulding, MD  [id number]/[id number]  LOS: 1 day   Brief hospital course:   Assessment & Plan: Anna Barrett is a 89 y.o. female with medical history significant of sCHF with EF 30-35%, HTN,  HLD, CAD, MI, GERD, CKD-3A, A-fib not on anticoagulants, anemia, who presents with SOB.    Acute on chronic systolic CHF (congestive heart failure) [facility]): Patient has SOB, positive JVD, significantly elevated BNP 1013, orthopnea, case in-service CHF exacerbation.  2D echo on 07/07/2022 showed EF 30 to 35%.  Patient has 2 L new oxygen requirement, but no acute respiratory distress. --cont IV lasix  40 daily   CAP (community acquired pneumonia):  Chest x-ray showed basilar opacity.  Patient does not have fever; only has minimal leukocytosis with WBC 10.6, but her procalcitonin is elevated 2.56;  cannot completely rule out pneumonia. --cont ceftriaxone  and azithro  CAD (coronary artery disease) and Myocardial injury: trop  25 --> 37, No CP, likely demand ischemia. --cont ASA 81   Hypertension --cont coreg, IV lasix    Asthma, chronic: No wheezing -As needed Mucinex --cont daily bronchodilators    Chronic kidney disease, stage 3a (HCC): Renal function close to baseline.  Recent baseline creatinine 0.8-1.1.  Her creatinine is 1.12, BUN 33, GFR 44 -Follow-up with BMP   Atrial fibrillation, chronic (HCC): Heart rate 121--> 90s -Patient is not taking anticoagulants currently --cont coreg   Protein-calorie malnutrition, severe (HCC): Body weight 33.6 kg, BMI 16.06 - Ensure - Nutrition consult  Hypokalemia --monitor and supplement PRN   DVT prophylaxis: Lovenox  SQ Code Status: DNR  Family Communication: son updated at bedside today Level of care: Telemetry Cardiac Dispo:   The patient is from: home Anticipated d/c is to: home Anticipated d/c date is: 1-2 days   Subjective and Interval History:  No  increased cough.   Objective: Vitals:   10/29/23 1650 10/29/23 1700 10/29/23 1715 10/29/23 1736  BP:  (!) 123/53    Pulse: 98 (!) 102 94 (!) 108  Resp: (!) 26 (!) 32 (!) 24   Temp:      TempSrc:      SpO2: 92% 93% 93%   Weight:      Height:       No intake or output data in the 24 hours ending 10/29/23 1915 Filed Weights   10/28/23 1926  Weight: 38.6 kg    Examination:   Constitutional: NAD, alert, oriented to person and place, cachectic  HEENT: conjunctivae and lids normal, EOMI CV: No cyanosis.   RESP: increased RR Extremities: mild edema in BLE SKIN: warm, dry Neuro: II - XII grossly intact.     Data Reviewed: I have personally reviewed labs and imaging studies  Time spent: 50 minutes  Garrison Kanner, MD [facility] If 7PM-7AM, please contact night-coverage 10/29/2023, 7:15 PM

## 2023-10-30 ENCOUNTER — Inpatient Hospital Stay
Admit: 2023-10-30 | Discharge: 2023-10-30 | Disposition: A | Discharge status: Home / Self Care | Attending: Internal Medicine | Admitting: Internal Medicine

## 2023-10-30 ENCOUNTER — Other Ambulatory Visit: Payer: Self-pay

## 2023-10-30 LAB — CBC
HCT: 35.3 % — ABNORMAL LOW (ref 36.0–46.0)
Hemoglobin: 11.4 g/dL — ABNORMAL LOW (ref 12.0–15.0)
MCH: 30.2 pg (ref 26.0–34.0)
MCHC: 32.3 g/dL (ref 30.0–36.0)
MCV: 93.4 fL (ref 80.0–100.0)
Platelets: 175 10*3/uL (ref 150–400)
RBC: 3.78 MIL/uL — ABNORMAL LOW (ref 3.87–5.11)
RDW: 13.4 % (ref 11.5–15.5)
WBC: 16.3 10*3/uL — ABNORMAL HIGH (ref 4.0–10.5)
nRBC: 0 % (ref 0.0–0.2)

## 2023-10-30 LAB — TROPONIN I (HIGH SENSITIVITY): Troponin I (High Sensitivity): 27 ng/L — ABNORMAL HIGH (ref <18)

## 2023-10-30 LAB — ECHOCARDIOGRAM COMPLETE
Area-P 1/2: 2.46 cm2
Calc EF: 38.2 %
Height: 61 in
MV VTI: 1.1 cm2
S' Lateral: 2.1 cm
Single Plane A2C EF: 42.2 %
Single Plane A4C EF: 36.7 %
Weight: 1354.51 [oz_av]

## 2023-10-30 LAB — BASIC METABOLIC PANEL WITH GFR
Anion gap: 8 (ref 5–15)
BUN: 44 mg/dL — ABNORMAL HIGH (ref 8–23)
CO2: 28 mmol/L (ref 22–32)
Calcium: 8.5 mg/dL — ABNORMAL LOW (ref 8.9–10.3)
Chloride: 103 mmol/L (ref 98–111)
Creatinine, Ser: 1.26 mg/dL — ABNORMAL HIGH (ref 0.44–1.00)
GFR, Estimated: 39 mL/min — ABNORMAL LOW (ref >60)
Glucose, Bld: 112 mg/dL — ABNORMAL HIGH (ref 70–99)
Potassium: 3.8 mmol/L (ref 3.5–5.1)
Sodium: 139 mmol/L (ref 135–145)

## 2023-10-30 LAB — GLUCOSE, CAPILLARY
Glucose-Capillary: 119 mg/dL — ABNORMAL HIGH (ref 70–99)
Glucose-Capillary: 125 mg/dL — ABNORMAL HIGH (ref 70–99)

## 2023-10-30 LAB — MAGNESIUM: Magnesium: 1.9 mg/dL (ref 1.7–2.4)

## 2023-10-30 MED ORDER — AMOXICILLIN-POT CLAVULANATE 875-125 MG PO TABS
1.0000 | ORAL_TABLET | Freq: Two times a day (BID) | ORAL | 0 refills | Status: DC
  Filled 2023-10-30: qty 4, 2d supply, fill #0

## 2023-10-30 MED ORDER — ADULT MULTIVITAMIN W/MINERALS CH: 1.0000 | ORAL_TABLET | Freq: Every day | ORAL | Status: DC

## 2023-10-30 MED ORDER — AZITHROMYCIN 250 MG PO TABS
500.0000 mg | ORAL_TABLET | Freq: Once | ORAL | Status: AC
  Administered 2023-10-30: 500 mg via ORAL
  Filled 2023-10-30: qty 2

## 2023-10-30 MED ORDER — SODIUM CHLORIDE 0.9 % IV SOLN
1.0000 g | Freq: Once | INTRAVENOUS | Status: AC
  Administered 2023-10-30: 1 g via INTRAVENOUS
  Filled 2023-10-30: qty 10

## 2023-10-30 MED ORDER — AMOXICILLIN-POT CLAVULANATE 500-125 MG PO TABS
1.0000 | ORAL_TABLET | Freq: Two times a day (BID) | ORAL | 0 refills | Status: AC
  Filled 2023-10-30: qty 4, 2d supply, fill #0

## 2023-10-30 MED ORDER — ENSURE PLUS HIGH PROTEIN PO LIQD: 237.0000 mL | Freq: Two times a day (BID) | ORAL | Status: DC

## 2023-10-30 NOTE — Plan of Care (Signed)
  Problem: Pain Managment: Goal: General experience of comfort will improve and/or be controlled Outcome: Progressing   Problem: Safety: Goal: Ability to remain free from injury will improve Outcome: Progressing   Problem: Skin Integrity: Goal: Risk for impaired skin integrity will decrease Outcome: Progressing   Problem: Respiratory: Goal: Ability to maintain adequate ventilation will improve Outcome: Progressing

## 2023-10-30 NOTE — Progress Notes (Signed)
 Initial Nutrition Assessment  DOCUMENTATION CODES:   Severe malnutrition in context of chronic illness  INTERVENTION:   Ensure Plus High Protein po BID, each supplement provides 350 kcal and 20 grams of protein.   Magic cup TID with meals, each supplement provides 290 kcal and 9 grams of protein  MVI po daily   Pt at high refeed risk; recommend monitor potassium, magnesium  and phosphorus labs daily until stable  Daily weights   NUTRITION DIAGNOSIS:   Severe Malnutrition related to chronic illness (CHF, advanced age) as evidenced by severe fat depletion, severe muscle depletion.  GOAL:   Patient will meet greater than or equal to 90% of their needs  MONITOR:   PO intake, Supplement acceptance, Labs, Weight trends, I & O's, Skin  REASON FOR ASSESSMENT:   Consult Assessment of nutrition requirement/status  ASSESSMENT:   89 y/o female with h/o CHF, HTN, CAD, MI, Afib, asthma, parathyroid adenoma and CKD III who is admitted with CAP.  Met with pt and pt's son in room today; history provided by both patient and son. Son reports pt with poor appetite and oral intake at baseline. Son reports that patient will sometimes eat a whole sandwich and will sometimes skip meals. Pt just mainly snacks throughout the day. Pt does sometimes drink strawberry Ensure at home; coupons given today. Pt enjoys ice cream and sweets but is not big on yogurt or soups. Pt ate only bites of breakfast today. Pt has ordered meatloaf and mashed potatoes for lunch. Pt requires her food to be chopped; pt initiated on a dysphagia 2 diet per MD. RD discussed with pt the importance of adequate nutrition needed to preserve lean muscle. RD will add supplements and MVI to help pt meet her estimated needs. Pt is likely at refeed risk. Son reports that pt's UBW is ~85lbs. Per chart, pt is down 6lbs(7%) since September; RD unsure how recently weight loss occurred.    Medications reviewed and include: aspirin , D3,  lovenox , lasix , MVI, protonix , senokot, azithromycin , ceftriaxone    Labs reviewed: K 3.8 wnl, BUN 44(H), creat 1.26(H), Mg 1.9 wnl Wbc- 16.3(H) Cbgs- 125, 119 x 24 hrs   NUTRITION - FOCUSED PHYSICAL EXAM:  Flowsheet Row Most Recent Value  Orbital Region Severe depletion  Upper Arm Region Severe depletion  Thoracic and Lumbar Region Severe depletion  Buccal Region Severe depletion  Temple Region Severe depletion  Clavicle Bone Region Severe depletion  Clavicle and Acromion Bone Region Severe depletion  Scapular Bone Region Severe depletion  Dorsal Hand Severe depletion  Patellar Region Severe depletion  Anterior Thigh Region Severe depletion  Posterior Calf Region Severe depletion  Edema (RD Assessment) Mild  Hair Reviewed  Eyes Reviewed  Mouth Reviewed  Skin Reviewed  Nails Reviewed   Diet Order:   Diet Order             DIET DYS 2 Room service appropriate? Yes with Assist; Fluid consistency: Thin; Fluid restriction: 1500 mL Fluid  Diet effective now                  EDUCATION NEEDS:   Education needs have been addressed  Skin:  Skin Assessment: Reviewed RN Assessment  Last BM:  6/18  Height:   Ht Readings from Last 1 Encounters:  10/28/23 5' 1 (1.549 m)    Weight:   Wt Readings from Last 1 Encounters:  10/30/23 38.4 kg    Ideal Body Weight:  47.7 kg  BMI:  Body mass index is 16  kg/m.  Estimated Nutritional Needs:   Kcal:  1100-1300kcal/day  Protein:  55-65g/day  Fluid:  1.0-1.2L/day  Torrance Freestone MS, RD, LDN If unable to be reached, please send secure chat to RD inpatient available from 8:00a-4:00p daily

## 2023-10-30 NOTE — Progress Notes (Signed)
 Heart Failure Navigator Progress Note  Assessed for Heart & Vascular TOC clinic readiness.  Patient does not meet criteria due to [facility] patient.  Navigator will sign off at this time.   Roxy Horseman, RN, BSN [facility] Heart Failure Navigator Secure Chat Only

## 2023-10-30 NOTE — Progress Notes (Signed)
 Patient arrieved via bed from the ED with granddaughter at bedside. Patient alert to name only. Incontinent of urine, denies pain or discomfort. Bed bath completed, Granddaughter at bedside who will stay with the patient through the evening.

## 2023-10-30 NOTE — Discharge Summary (Signed)
 Physician Discharge Summary   Anna Barrett  female DOB: 1923-01-02  FMW:[id number]  PCP: Lenon Layman ORN, MD  Admit date: 10/28/2023 Discharge date: 10/30/2023  Admitted From: home Disposition:  home Son updated at bedside prior to discharge. Home Health: Yes CODE STATUS: DNR  Discharge Instructions     Diet - low sodium heart healthy   Complete by: As directed       Hospital Course:  For full details, please see H&P, progress notes, consult notes and ancillary notes.  Briefly,  Anna Barrett is a 89 y.o. female with medical history significant of sCHF with EF 30-35%, HTN, CAD, MI, CKD-3A, A-fib not on anticoagulants, who presented with SOB.    Acute on chronic systolic CHF (congestive heart failure) (HCC):  On presentation, Patient had SOB, positive JVD, significantly elevated BNP 1013, orthopnea.  2D echo on 07/07/2022 showed EF 30 to 35%.  Patient was placed on 2L O2, however, oxygen saturation 91% on room air, so did not meet criteria for respiratory failure. --received IV lasix  40 daily x 3 days.   CAP (community acquired pneumonia):  Chest x-ray showed basilar opacity.  Patient did not have fever; only has minimal leukocytosis with WBC 10.6, but her procalcitonin was elevated 2.56;  cannot completely rule out pneumonia. --received 3 days of ceftriaxone  and azithro and discharged on 2 more days of Augmentin .   CAD (coronary artery disease) and Myocardial injury:  trop  25 --> 37, No CP, likely demand ischemia. --cont ASA 81   Hypertension --pt not taking amlodipine  and clonidine  PTA --cont home coreg    Asthma, chronic:  No wheezing --cont daily bronchodilators    Chronic kidney disease, stage 3a (HCC):  Renal function close to baseline.     Atrial fibrillation, chronic (HCC):  -Patient is not taking anticoagulants currently --cont home coreg    Protein-calorie malnutrition, severe (HCC): Body weight 33.6 kg, BMI 16.06 - Ensure and multivitamin with  minerals   Hypokalemia --monitored and supplemented PRN   Unless noted above, medications under STOP list are ones pt was not taking PTA.  Discharge Diagnoses:  Principal Problem:   Acute on chronic systolic CHF (congestive heart failure) (HCC) Active Problems:   CAP (community acquired pneumonia)   CAD (coronary artery disease)   Myocardial injury   Hypertension   Asthma, chronic   Chronic kidney disease, stage 3a (HCC)   Atrial fibrillation, chronic (HCC)   Protein-calorie malnutrition, severe (HCC)   30 Day Unplanned Readmission Risk Score    Flowsheet Row ED to Hosp-Admission (Current) from 10/28/2023 in [facility]  30 Day Unplanned Readmission Risk Score (%) 19.49 Filed at 10/30/2023 1200    This score is the patient's risk of an unplanned readmission within 30 days of being discharged (0 -100%). The score is based on dignosis, age, lab data, medications, orders, and past utilization.   Low:  0-14.9   Medium: 15-21.9   High: 22-29.9   Extreme: 30 and above         Discharge Instructions:  Allergies as of 10/30/2023       Reactions   Shellfish Allergy Other (See Comments), Hives   Atorvastatin Other (See Comments)   Iodine Other (See Comments)        Medication List     STOP taking these medications    amLODipine  2.5 MG tablet Commonly known as: NORVASC    chlorpheniramine-HYDROcodone 10-8 MG/5ML Commonly known as: Tussionex Pennkinetic ER   cloNIDine  0.1 mg/24hr patch  Commonly known as: CATAPRES  - Dosed in mg/24 hr   ipratropium-albuterol  0.5-2.5 (3) MG/3ML Soln Commonly known as: DUONEB   nystatin 100000 UNIT/ML suspension Commonly known as: MYCOSTATIN       TAKE these medications    albuterol  108 (90 Base) MCG/ACT inhaler Commonly known as: VENTOLIN  HFA Inhale 2 puffs into the lungs every 4 (four) hours as needed for wheezing.   amoxicillin -clavulanate 500-125 MG tablet Commonly known as: Augmentin  Take 1  tablet by mouth 2 (two) times daily for 2 days. Start taking on: October 31, 2023   aspirin  EC 325 MG tablet Take 1 tablet (325 mg total) by mouth daily.   azelastine  0.1 % nasal spray Commonly known as: ASTELIN  Place 1 spray into both nostrils 2 (two) times daily.   carvedilol  3.125 MG tablet Commonly known as: COREG  Take 3.125 mg by mouth 2 (two) times daily with a meal.   Cholecalciferol  50 MCG (2000 UT) Tabs Take 2,000 Units by mouth daily.   clotrimazole-betamethasone cream Commonly known as: LOTRISONE Apply topically 2 (two) times daily.   feeding supplement Liqd Take 237 mLs by mouth 3 (three) times daily between meals.   ferrous sulfate  325 (65 FE) MG tablet Take 1 tablet (325 mg total) by mouth 2 (two) times daily with a meal.   fluticasone  50 MCG/ACT nasal spray Commonly known as: FLONASE  Place 1 spray into both nostrils at bedtime.   multivitamin with minerals Tabs tablet Take 1 tablet by mouth daily. Start taking on: October 31, 2023   nitroGLYCERIN  0.4 MG SL tablet Commonly known as: NITROSTAT  Place 0.4 mg under the tongue every 5 (five) minutes as needed for chest pain.   omeprazole 10 MG capsule Commonly known as: PRILOSEC Take 10 mg by mouth daily.   ondansetron  4 MG disintegrating tablet Commonly known as: ZOFRAN -ODT Allow 1-2 tablets to dissolve in your mouth every 8 hours as needed for nausea/vomiting   senna 8.6 MG Tabs tablet Commonly known as: SENOKOT Take 1 tablet by mouth 2 (two) times daily.   Wixela Inhub 100-50 MCG/ACT Aepb Generic drug: fluticasone -salmeterol Inhale 1 puff into the lungs at bedtime.         Follow-up Information     Lenon Layman ORN, MD Follow up in 1 week(s).   Specialty: Internal Medicine Contact information: [address] [facility] [place] [address] [phone]                 Allergies  Allergen Reactions   Shellfish Allergy Other (See Comments) and Hives    Atorvastatin Other (See Comments)   Iodine Other (See Comments)     The results of significant diagnostics from this hospitalization (including imaging, microbiology, ancillary and laboratory) are listed below for reference.   Consultations:   Procedures/Studies: DG Chest Port 1 View Result Date: 10/28/2023 CLINICAL DATA:  Shortness of breath EXAM: PORTABLE CHEST 1 VIEW COMPARISON:  01/14/2023 FINDINGS: Mild cardiomegaly with small right greater than left pleural effusions. Right greater than left basilar airspace disease. Vascular congestion. Hiatal hernia. IMPRESSION: Cardiomegaly with vascular congestion and small right greater than left pleural effusions. Right greater than left basilar airspace disease, atelectasis versus pneumonia. Electronically Signed   By: Luke Bun M.D.   On: 10/28/2023 17:49      Labs: BNP (last 3 results) Recent Labs    01/14/23 1124 10/28/23 1721  BNP 347.4* 1,013.7*   Basic Metabolic Panel: Recent Labs  Lab 10/28/23 1651 10/28/23 1834 10/29/23 9666  10/30/23 0500  NA 137  --  139 139  K 3.6  --  3.3* 3.8  CL 102  --  108 103  CO2 26  --  25 28  GLUCOSE 159*  --  106* 112*  BUN 33*  --  36* 44*  CREATININE 1.12*  --  1.13* 1.26*  CALCIUM 8.4*  --  7.4* 8.5*  MG  --  1.9  --  1.9   Liver Function Tests: No results for input(s): AST, ALT, ALKPHOS, BILITOT, PROT, ALBUMIN in the last 168 hours. No results for input(s): LIPASE, AMYLASE in the last 168 hours. No results for input(s): AMMONIA in the last 168 hours. CBC: Recent Labs  Lab 10/28/23 1651 10/29/23 0333 10/30/23 0500  WBC 10.6* 13.3* 16.3*  HGB 12.1 9.4* 11.4*  HCT 37.6 29.4* 35.3*  MCV 92.8 95.5 93.4  PLT 187 141* 175   Cardiac Enzymes: No results for input(s): CKTOTAL, CKMB, CKMBINDEX, TROPONINI in the last 168 hours. BNP: Invalid input(s): POCBNP CBG: Recent Labs  Lab 10/30/23 0855 10/30/23 1345  GLUCAP 119* 125*   D-Dimer No  results for input(s): DDIMER in the last 72 hours. Hgb A1c No results for input(s): HGBA1C in the last 72 hours. Lipid Profile No results for input(s): CHOL, HDL, LDLCALC, TRIG, CHOLHDL, LDLDIRECT in the last 72 hours. Thyroid  function studies No results for input(s): TSH, T4TOTAL, T3FREE, THYROIDAB in the last 72 hours.  Invalid input(s): FREET3 Anemia work up No results for input(s): VITAMINB12, FOLATE, FERRITIN, TIBC, IRON, RETICCTPCT in the last 72 hours. Urinalysis No results found for: COLORURINE, APPEARANCEUR, LABSPEC, PHURINE, GLUCOSEU, HGBUR, BILIRUBINUR, KETONESUR, PROTEINUR, UROBILINOGEN, NITRITE, LEUKOCYTESUR Sepsis Labs Recent Labs  Lab 10/28/23 1651 10/29/23 0333 10/30/23 0500  WBC 10.6* 13.3* 16.3*   Microbiology Recent Results (from the past 240 hours)  Blood culture (routine x 2)     Status: None (Preliminary result)   Collection Time: 10/28/23  6:34 PM   Specimen: BLOOD LEFT WRIST  Result Value Ref Range Status   Specimen Description BLOOD LEFT WRIST  Final   Special Requests   Final    BOTTLES DRAWN AEROBIC AND ANAEROBIC Blood Culture results may not be optimal due to an inadequate volume of blood received in culture bottles   Culture   Final    NO GROWTH < 24 HOURS Performed at [facility], [address] [place], [place] [address]    Report Status PENDING  Incomplete  Blood culture (routine x 2)     Status: None (Preliminary result)   Collection Time: 10/28/23  6:34 PM   Specimen: Right Antecubital; Blood  Result Value Ref Range Status   Specimen Description RIGHT ANTECUBITAL  Final   Special Requests   Final    BOTTLES DRAWN AEROBIC AND ANAEROBIC Blood Culture adequate volume   Culture   Final    NO GROWTH < 24 HOURS Performed at [facility], [address], [place], [place] [address]    Report Status PENDING  Incomplete  Resp panel by RT-PCR (RSV, Flu A&B, Covid)  Anterior Nasal Swab     Status: None   Collection Time: 10/28/23 11:01 PM   Specimen: Anterior Nasal Swab  Result Value Ref Range Status   SARS Coronavirus 2 by RT PCR NEGATIVE NEGATIVE Final    Comment: (NOTE) SARS-CoV-2 target nucleic acids are NOT DETECTED.  The SARS-CoV-2 RNA is generally detectable in upper respiratory specimens during the acute phase of infection. The lowest concentration of SARS-CoV-2  viral copies this assay can detect is 138 copies/mL. A negative result does not preclude SARS-Cov-2 infection and should not be used as the sole basis for treatment or other patient management decisions. A negative result may occur with  improper specimen collection/handling, submission of specimen other than nasopharyngeal swab, presence of viral mutation(s) within the areas targeted by this assay, and inadequate number of viral copies(<138 copies/mL). A negative result must be combined with clinical observations, patient history, and epidemiological information. The expected result is Negative.  Fact Sheet for Patients:  BloggerCourse.com  Fact Sheet for Healthcare Providers:  SeriousBroker.it  This test is no t yet approved or cleared by the [place]  FDA and  has been authorized for detection and/or diagnosis of SARS-CoV-2 by FDA under an Emergency Use Authorization (EUA). This EUA will remain  in effect (meaning this test can be used) for the duration of the COVID-19 declaration under Section 564(b)(1) of the Act, 21 U.S.C.section 360bbb-3(b)(1), unless the authorization is terminated  or revoked sooner.       Influenza A by PCR NEGATIVE NEGATIVE Final   Influenza B by PCR NEGATIVE NEGATIVE Final    Comment: (NOTE) The Xpert Xpress SARS-CoV-2/FLU/RSV plus assay is intended as an aid in the diagnosis of influenza from Nasopharyngeal swab specimens and should not be used as a sole basis for treatment. Nasal washings  and aspirates are unacceptable for Xpert Xpress SARS-CoV-2/FLU/RSV testing.  Fact Sheet for Patients: BloggerCourse.com  Fact Sheet for Healthcare Providers: SeriousBroker.it  This test is not yet approved or cleared by the [place]  FDA and has been authorized for detection and/or diagnosis of SARS-CoV-2 by FDA under an Emergency Use Authorization (EUA). This EUA will remain in effect (meaning this test can be used) for the duration of the COVID-19 declaration under Section 564(b)(1) of the Act, 21 U.S.C. section 360bbb-3(b)(1), unless the authorization is terminated or revoked.     Resp Syncytial Virus by PCR NEGATIVE NEGATIVE Final    Comment: (NOTE) Fact Sheet for Patients: BloggerCourse.com  Fact Sheet for Healthcare Providers: SeriousBroker.it  This test is not yet approved or cleared by the [place]  FDA and has been authorized for detection and/or diagnosis of SARS-CoV-2 by FDA under an Emergency Use Authorization (EUA). This EUA will remain in effect (meaning this test can be used) for the duration of the COVID-19 declaration under Section 564(b)(1) of the Act, 21 U.S.C. section 360bbb-3(b)(1), unless the authorization is terminated or revoked.  Performed at [facility], [address] [place], [place] [address]      Total time spend on discharging this patient, including the last patient exam, discussing the hospital stay, instructions for ongoing care as it relates to all pertinent caregivers, as well as preparing the medical discharge records, prescriptions, and/or referrals as applicable, is 35 minutes.    Ellouise Haber, MD  [facility] 10/30/2023, 3:04 PM

## 2023-10-30 NOTE — Care Management Important Message (Signed)
 Important Message  Patient Details  Name: Anna Barrett MRN: [id number] Date of Birth: 1923-02-20   Important Message Given:  Yes - Medicare IM     Anise Kerns 10/30/2023, 1:30 PM

## 2023-11-01 LAB — LEGIONELLA PNEUMOPHILA SEROGP 1 UR AG: L. pneumophila Serogp 1 Ur Ag: NEGATIVE

## 2023-11-02 LAB — CULTURE, BLOOD (ROUTINE X 2)
Culture: NO GROWTH
Culture: NO GROWTH
Special Requests: ADEQUATE

## 2023-11-21 DIAGNOSIS — L989 Disorder of the skin and subcutaneous tissue, unspecified: Secondary | ICD-10-CM | POA: Diagnosis not present

## 2023-11-28 DIAGNOSIS — D485 Neoplasm of uncertain behavior of skin: Secondary | ICD-10-CM | POA: Diagnosis not present

## 2023-11-28 DIAGNOSIS — C44329 Squamous cell carcinoma of skin of other parts of face: Secondary | ICD-10-CM | POA: Diagnosis not present

## 2023-12-29 DIAGNOSIS — C44329 Squamous cell carcinoma of skin of other parts of face: Secondary | ICD-10-CM | POA: Diagnosis not present

## 2024-03-08 DIAGNOSIS — D0471 Carcinoma in situ of skin of right lower limb, including hip: Secondary | ICD-10-CM | POA: Diagnosis not present

## 2024-03-08 DIAGNOSIS — D485 Neoplasm of uncertain behavior of skin: Secondary | ICD-10-CM | POA: Diagnosis not present

## 2024-03-23 DIAGNOSIS — H109 Unspecified conjunctivitis: Secondary | ICD-10-CM | POA: Diagnosis not present

## 2024-03-30 DIAGNOSIS — C44722 Squamous cell carcinoma of skin of right lower limb, including hip: Secondary | ICD-10-CM | POA: Diagnosis not present

## 2024-04-14 DIAGNOSIS — L570 Actinic keratosis: Secondary | ICD-10-CM | POA: Diagnosis not present

## 2024-04-14 DIAGNOSIS — Z859 Personal history of malignant neoplasm, unspecified: Secondary | ICD-10-CM | POA: Diagnosis not present

## 2024-04-14 DIAGNOSIS — L578 Other skin changes due to chronic exposure to nonionizing radiation: Secondary | ICD-10-CM | POA: Diagnosis not present

## 2024-04-14 DIAGNOSIS — L821 Other seborrheic keratosis: Secondary | ICD-10-CM | POA: Diagnosis not present

## 2024-04-14 DIAGNOSIS — Z872 Personal history of diseases of the skin and subcutaneous tissue: Secondary | ICD-10-CM | POA: Diagnosis not present

## 2024-05-10 ENCOUNTER — Other Ambulatory Visit: Payer: Self-pay

## 2024-05-10 ENCOUNTER — Emergency Department

## 2024-05-10 ENCOUNTER — Inpatient Hospital Stay
Admission: EM | Admit: 2024-05-10 | Discharge: 2024-05-17 | DRG: 480 | Disposition: A | Discharge status: Skilled Nursing Facility | Attending: Obstetrics and Gynecology | Admitting: Obstetrics and Gynecology

## 2024-05-10 DIAGNOSIS — S72142A Displaced intertrochanteric fracture of left femur, initial encounter for closed fracture: Principal | ICD-10-CM | POA: Diagnosis present

## 2024-05-10 DIAGNOSIS — R339 Retention of urine, unspecified: Secondary | ICD-10-CM | POA: Diagnosis not present

## 2024-05-10 DIAGNOSIS — Z681 Body mass index (BMI) 19 or less, adult: Secondary | ICD-10-CM | POA: Diagnosis not present

## 2024-05-10 DIAGNOSIS — Z79899 Other long term (current) drug therapy: Secondary | ICD-10-CM

## 2024-05-10 DIAGNOSIS — J4520 Mild intermittent asthma, uncomplicated: Secondary | ICD-10-CM | POA: Diagnosis not present

## 2024-05-10 DIAGNOSIS — I5042 Chronic combined systolic (congestive) and diastolic (congestive) heart failure: Secondary | ICD-10-CM | POA: Diagnosis present

## 2024-05-10 DIAGNOSIS — E43 Unspecified severe protein-calorie malnutrition: Secondary | ICD-10-CM | POA: Diagnosis present

## 2024-05-10 DIAGNOSIS — I5A Non-ischemic myocardial injury (non-traumatic): Secondary | ICD-10-CM | POA: Diagnosis present

## 2024-05-10 DIAGNOSIS — I959 Hypotension, unspecified: Secondary | ICD-10-CM | POA: Diagnosis not present

## 2024-05-10 DIAGNOSIS — I252 Old myocardial infarction: Secondary | ICD-10-CM

## 2024-05-10 DIAGNOSIS — I2489 Other forms of acute ischemic heart disease: Secondary | ICD-10-CM | POA: Diagnosis present

## 2024-05-10 DIAGNOSIS — Y92009 Unspecified place in unspecified non-institutional (private) residence as the place of occurrence of the external cause: Secondary | ICD-10-CM | POA: Diagnosis not present

## 2024-05-10 DIAGNOSIS — I10 Essential (primary) hypertension: Secondary | ICD-10-CM | POA: Diagnosis present

## 2024-05-10 DIAGNOSIS — N179 Acute kidney failure, unspecified: Secondary | ICD-10-CM | POA: Diagnosis present

## 2024-05-10 DIAGNOSIS — R64 Cachexia: Secondary | ICD-10-CM | POA: Diagnosis present

## 2024-05-10 DIAGNOSIS — I480 Paroxysmal atrial fibrillation: Secondary | ICD-10-CM | POA: Diagnosis present

## 2024-05-10 DIAGNOSIS — I130 Hypertensive heart and chronic kidney disease with heart failure and stage 1 through stage 4 chronic kidney disease, or unspecified chronic kidney disease: Secondary | ICD-10-CM | POA: Diagnosis present

## 2024-05-10 DIAGNOSIS — I255 Ischemic cardiomyopathy: Secondary | ICD-10-CM | POA: Diagnosis present

## 2024-05-10 DIAGNOSIS — K5900 Constipation, unspecified: Secondary | ICD-10-CM | POA: Diagnosis present

## 2024-05-10 DIAGNOSIS — W1830XA Fall on same level, unspecified, initial encounter: Secondary | ICD-10-CM | POA: Diagnosis present

## 2024-05-10 DIAGNOSIS — I447 Left bundle-branch block, unspecified: Secondary | ICD-10-CM | POA: Diagnosis present

## 2024-05-10 DIAGNOSIS — M810 Age-related osteoporosis without current pathological fracture: Secondary | ICD-10-CM | POA: Diagnosis present

## 2024-05-10 DIAGNOSIS — N1831 Chronic kidney disease, stage 3a: Secondary | ICD-10-CM | POA: Diagnosis present

## 2024-05-10 DIAGNOSIS — S72002A Fracture of unspecified part of neck of left femur, initial encounter for closed fracture: Secondary | ICD-10-CM | POA: Diagnosis present

## 2024-05-10 DIAGNOSIS — J45909 Unspecified asthma, uncomplicated: Secondary | ICD-10-CM | POA: Diagnosis present

## 2024-05-10 DIAGNOSIS — I482 Chronic atrial fibrillation, unspecified: Secondary | ICD-10-CM | POA: Diagnosis present

## 2024-05-10 DIAGNOSIS — Z9841 Cataract extraction status, right eye: Secondary | ICD-10-CM

## 2024-05-10 DIAGNOSIS — Z9049 Acquired absence of other specified parts of digestive tract: Secondary | ICD-10-CM

## 2024-05-10 DIAGNOSIS — E875 Hyperkalemia: Secondary | ICD-10-CM | POA: Diagnosis not present

## 2024-05-10 DIAGNOSIS — E785 Hyperlipidemia, unspecified: Secondary | ICD-10-CM | POA: Diagnosis present

## 2024-05-10 DIAGNOSIS — D62 Acute posthemorrhagic anemia: Secondary | ICD-10-CM | POA: Diagnosis not present

## 2024-05-10 DIAGNOSIS — Z66 Do not resuscitate: Secondary | ICD-10-CM | POA: Diagnosis present

## 2024-05-10 DIAGNOSIS — Z91013 Allergy to seafood: Secondary | ICD-10-CM

## 2024-05-10 DIAGNOSIS — Z8249 Family history of ischemic heart disease and other diseases of the circulatory system: Secondary | ICD-10-CM

## 2024-05-10 DIAGNOSIS — Z90710 Acquired absence of both cervix and uterus: Secondary | ICD-10-CM

## 2024-05-10 DIAGNOSIS — Z7982 Long term (current) use of aspirin: Secondary | ICD-10-CM

## 2024-05-10 DIAGNOSIS — I2510 Atherosclerotic heart disease of native coronary artery without angina pectoris: Secondary | ICD-10-CM | POA: Diagnosis present

## 2024-05-10 DIAGNOSIS — J9601 Acute respiratory failure with hypoxia: Secondary | ICD-10-CM | POA: Diagnosis present

## 2024-05-10 DIAGNOSIS — Z9842 Cataract extraction status, left eye: Secondary | ICD-10-CM

## 2024-05-10 DIAGNOSIS — W19XXXA Unspecified fall, initial encounter: Secondary | ICD-10-CM

## 2024-05-10 DIAGNOSIS — F0390 Unspecified dementia without behavioral disturbance: Secondary | ICD-10-CM | POA: Diagnosis present

## 2024-05-10 LAB — CBC WITH DIFFERENTIAL/PLATELET
Abs Immature Granulocytes: 0.09 K/uL — ABNORMAL HIGH (ref 0.00–0.07)
Basophils Absolute: 0.1 K/uL (ref 0.0–0.1)
Basophils Relative: 1 %
Eosinophils Absolute: 0.3 K/uL (ref 0.0–0.5)
Eosinophils Relative: 2 %
HCT: 38.4 % (ref 36.0–46.0)
Hemoglobin: 12.5 g/dL (ref 12.0–15.0)
Immature Granulocytes: 1 %
Lymphocytes Relative: 14 %
Lymphs Abs: 1.7 K/uL (ref 0.7–4.0)
MCH: 30.3 pg (ref 26.0–34.0)
MCHC: 32.6 g/dL (ref 30.0–36.0)
MCV: 93 fL (ref 80.0–100.0)
Monocytes Absolute: 0.7 K/uL (ref 0.1–1.0)
Monocytes Relative: 6 %
Neutro Abs: 9.5 K/uL — ABNORMAL HIGH (ref 1.7–7.7)
Neutrophils Relative %: 76 %
Platelets: 243 K/uL (ref 150–400)
RBC: 4.13 MIL/uL (ref 3.87–5.11)
RDW: 12.8 % (ref 11.5–15.5)
WBC: 12.4 K/uL — ABNORMAL HIGH (ref 4.0–10.5)
nRBC: 0 % (ref 0.0–0.2)

## 2024-05-10 LAB — COMPREHENSIVE METABOLIC PANEL WITH GFR
ALT: 17 U/L (ref 0–44)
AST: 14 U/L — ABNORMAL LOW (ref 15–41)
Albumin: 3.8 g/dL (ref 3.5–5.0)
Alkaline Phosphatase: 76 U/L (ref 38–126)
Anion gap: 11 (ref 5–15)
BUN: 49 mg/dL — ABNORMAL HIGH (ref 8–23)
CO2: 28 mmol/L (ref 22–32)
Calcium: 8.7 mg/dL — ABNORMAL LOW (ref 8.9–10.3)
Chloride: 102 mmol/L (ref 98–111)
Creatinine, Ser: 1.34 mg/dL — ABNORMAL HIGH (ref 0.44–1.00)
GFR, Estimated: 35 mL/min — ABNORMAL LOW
Glucose, Bld: 189 mg/dL — ABNORMAL HIGH (ref 70–99)
Potassium: 4 mmol/L (ref 3.5–5.1)
Sodium: 140 mmol/L (ref 135–145)
Total Bilirubin: 0.4 mg/dL (ref 0.0–1.2)
Total Protein: 7.1 g/dL (ref 6.5–8.1)

## 2024-05-10 LAB — TROPONIN T, HIGH SENSITIVITY: Troponin T High Sensitivity: 27 ng/L — ABNORMAL HIGH (ref 0–19)

## 2024-05-10 MED ORDER — HYDRALAZINE HCL 20 MG/ML IJ SOLN: 5.0000 mg | INTRAMUSCULAR | Status: DC | PRN

## 2024-05-10 MED ORDER — ONDANSETRON HCL 4 MG/2ML IJ SOLN
4.0000 mg | Freq: Once | INTRAMUSCULAR | Status: AC
  Administered 2024-05-10: 4 mg via INTRAVENOUS
  Filled 2024-05-10: qty 2

## 2024-05-10 MED ORDER — SODIUM CHLORIDE 0.9 % IV SOLN: 1.0000 g | Freq: Once | INTRAVENOUS | Status: DC

## 2024-05-10 MED ORDER — SENNOSIDES-DOCUSATE SODIUM 8.6-50 MG PO TABS: 1.0000 | ORAL_TABLET | Freq: Every evening | ORAL | Status: DC | PRN

## 2024-05-10 MED ORDER — ALBUTEROL SULFATE (2.5 MG/3ML) 0.083% IN NEBU: 2.5000 mg | INHALATION_SOLUTION | RESPIRATORY_TRACT | Status: DC | PRN

## 2024-05-10 MED ORDER — LIDOCAINE 5 % EX PTCH
1.0000 | MEDICATED_PATCH | CUTANEOUS | Status: AC
  Administered 2024-05-11 – 2024-05-16 (×5): 1 via TRANSDERMAL
  Filled 2024-05-10 (×6): qty 1

## 2024-05-10 MED ORDER — ONDANSETRON HCL 4 MG/2ML IJ SOLN
4.0000 mg | Freq: Three times a day (TID) | INTRAMUSCULAR | Status: DC | PRN
  Administered 2024-05-12: 4 mg via INTRAVENOUS
  Filled 2024-05-10: qty 2

## 2024-05-10 MED ORDER — METHOCARBAMOL 500 MG PO TABS
500.0000 mg | ORAL_TABLET | Freq: Three times a day (TID) | ORAL | Status: DC | PRN
  Administered 2024-05-11 – 2024-05-13 (×2): 500 mg via ORAL
  Filled 2024-05-10 (×2): qty 1

## 2024-05-10 MED ORDER — MORPHINE SULFATE (PF) 2 MG/ML IV SOLN
0.5000 mg | INTRAVENOUS | Status: DC | PRN
  Administered 2024-05-10: 0.5 mg via INTRAVENOUS
  Filled 2024-05-10: qty 1

## 2024-05-10 MED ORDER — OXYCODONE-ACETAMINOPHEN 5-325 MG PO TABS
1.0000 | ORAL_TABLET | ORAL | Status: DC | PRN
  Administered 2024-05-11 (×2): 1 via ORAL
  Filled 2024-05-10 (×2): qty 1

## 2024-05-10 MED ORDER — ACETAMINOPHEN 325 MG PO TABS: 650.0000 mg | ORAL_TABLET | Freq: Four times a day (QID) | ORAL | Status: DC | PRN

## 2024-05-10 MED ORDER — SODIUM CHLORIDE 0.9 % IV SOLN: 500.0000 mg | Freq: Once | INTRAVENOUS | Status: DC

## 2024-05-10 MED ORDER — DM-GUAIFENESIN ER 30-600 MG PO TB12: 1.0000 | ORAL_TABLET | Freq: Two times a day (BID) | ORAL | Status: DC | PRN

## 2024-05-10 NOTE — ED Notes (Signed)
 Noticeable shortening and rotation in the left leg with guarding of the left hip

## 2024-05-10 NOTE — ED Notes (Signed)
 Pt has purewick placed due to likely hip Fx

## 2024-05-10 NOTE — H&P (Incomplete)
 " History and Physical    Anna Barrett FMW:[id number] DOB: 1922-11-22 DOA: 05/10/2024  Referring MD/NP/PA:   PCP: Lenon Layman ORN, MD   Patient coming from:  The patient is coming from home.     Chief Complaint: fall and left hip pain  HPI: Anna Barrett is a 89 y.o. female with medical history significant of  sCHF with EF 35-40%, HTN,  HLD, CAD, MI, asthma, GERD, CKD-3A, A-fib not on anticoagulants, anemia, who presents with fall and left hip pain.   Per his son and granddaughter at the bedside, patient fell accidentally when she was walking at home round 10:00 p.m.  No LOC.  Her granddaughter is very sure that the patient did not have head or neck injury.  Family does not want to do CT scan of the head and neck.  Patient developed left hip pain, which is constant, severe, sharp, nonradiating, aggravated by movement.  Left leg is shortened and externally rotated.  No chest pain, cough, SOB.  Patient has nausea, no vomiting, diarrhea or abdominal pain.  No symptoms of UTI.  Data reviewed independently and ED Course: pt was found to have WBC 12.4, slightly worsening renal function, troponin 27, temperature normal, blood pressure 180/104, heart rate 70-90s, RR 29 --> 19, oxygen saturation 97% on room air. X-ray of left hip/pelvis showed intertrochanteric fracture. Patient is admitted to telemetry bed as inpatient.  Dr. Rolan of Ortho and Dr. Wilburn of card are consulted.   Chest x-ray:  negative  X-ray of left hip/pelvis:  1. Acute comminuted intertrochanteric fracture of the left hip with varus angulation. 2. Partially visualized intramedullary nail fixation of the right hip in place.   EKG: I have personally reviewed.  Sinus rhythm, QTc 494, left bundle Balaji.   Review of Systems:   General: no fevers, chills, no body weight gain, has fatigue HEENT: no blurry vision, hearing changes or sore throat Respiratory: no dyspnea, coughing, wheezing CV: no chest pain, no  palpitations GI: no nausea, vomiting, abdominal pain, diarrhea, constipation GU: no dysuria, burning on urination, increased urinary frequency, hematuria  Ext: no leg edema Neuro: no unilateral weakness, numbness, or tingling, no vision change or hearing loss. Has fall Skin: no rash, no skin tear. MSK: No muscle spasm, no deformity, no limitation of range of movement in spin. Has left hip pain. Heme: No easy bruising.  Travel history: No recent long distant travel.   Allergy: Allergies[1]  Past Medical History:  Diagnosis Date   Asthma    Carotid stenosis    Headache    MIGRAINES   History of MI (myocardial infarction)    Hyperglycemia    Hyperlipidemia    Hypertension    Ischemic cardiomyopathy    Myocardial infarction [facility] 1962   Osteoporosis    Parathyroid adenoma     Past Surgical History:  Procedure Laterality Date   ABDOMINAL HYSTERECTOMY     APPENDECTOMY     CATARACT EXTRACTION Bilateral    ESOPHAGOGASTRODUODENOSCOPY (EGD) WITH PROPOFOL  N/A 04/10/2020   Procedure: ESOPHAGOGASTRODUODENOSCOPY (EGD) WITH PROPOFOL ;  Surgeon: Toledo, Ladell POUR, MD;  Location: [facility] ENDOSCOPY;  Service: Gastroenterology;  Laterality: N/A;   ESOPHAGOGASTRODUODENOSCOPY (EGD) WITH PROPOFOL  N/A 06/06/2021   Procedure: ESOPHAGOGASTRODUODENOSCOPY (EGD) WITH PROPOFOL ;  Surgeon: Therisa Bi, MD;  Location: [facility] ENDOSCOPY;  Service: Gastroenterology;  Laterality: N/A;   ESOPHAGOGASTRODUODENOSCOPY (EGD) WITH PROPOFOL  N/A 06/21/2021   Procedure: ESOPHAGOGASTRODUODENOSCOPY (EGD) WITH PROPOFOL ;  Surgeon: Toledo, Ladell POUR, MD;  Location: [facility] ENDOSCOPY;  Service: Gastroenterology;  Laterality: N/A;   EYE SURGERY     FRACTURE SURGERY     ORIF ANKLE FRACTURE Left 04/17/2015   Procedure: OPEN REDUCTION INTERNAL FIXATION (ORIF) ANKLE FRACTURE;  Surgeon: Norleen JINNY Maltos, MD;  Location: [facility] ORS;  Service: Orthopedics;  Laterality: Left;    Social History:  reports that she has never smoked. She has never used  smokeless tobacco. She reports that she does not drink alcohol and does not use drugs.  Family History:  Family History  Problem Relation Age of Onset   Heart disease Mother    Heart disease Father      Prior to Admission medications  Medication Sig Start Date End Date Taking? Authorizing Provider  albuterol  (VENTOLIN  HFA) 108 (90 Base) MCG/ACT inhaler Inhale 2 puffs into the lungs every 4 (four) hours as needed for wheezing. 01/08/23 01/08/24  [provider]  aspirin  EC 325 MG tablet Take 1 tablet (325 mg total) by mouth daily. 04/19/15   Sherial Bail, MD  azelastine  (ASTELIN ) 0.1 % nasal spray Place 1 spray into both nostrils 2 (two) times daily. 06/06/21 06/06/22  Fausto Burnard LABOR, DO  carvedilol  (COREG ) 3.125 MG tablet Take 3.125 mg by mouth 2 (two) times daily with a meal. 07/17/22   [provider]  Cholecalciferol  50 MCG (2000 UT) TABS Take 2,000 Units by mouth daily.    [provider]  feeding supplement (ENSURE ENLIVE / ENSURE PLUS) LIQD Take 237 mLs by mouth 3 (three) times daily between meals. 06/06/21   Fausto Burnard LABOR, DO  ferrous sulfate  325 (65 FE) MG tablet Take 1 tablet (325 mg total) by mouth 2 (two) times daily with a meal. 04/19/15   Sherial Bail, MD  fluticasone  (FLONASE ) 50 MCG/ACT nasal spray Place 1 spray into both nostrils at bedtime.    [provider]  Multiple Vitamin (MULTIVITAMIN WITH MINERALS) TABS tablet Take 1 tablet by mouth daily. 10/31/23   Awanda City, MD  omeprazole (PRILOSEC) 10 MG capsule Take 10 mg by mouth daily.    [provider]  ondansetron  (ZOFRAN -ODT) 4 MG disintegrating tablet Allow 1-2 tablets to dissolve in your mouth every 8 hours as needed for nausea/vomiting 05/18/22   Gordan Huxley, MD  senna (SENOKOT) 8.6 MG TABS tablet Take 1 tablet by mouth 2 (two) times daily.    [provider]  NAPOLEON INHUB 100-50 MCG/ACT AEPB Inhale 1 puff into the lungs at bedtime.    [provider]     Physical Exam: Vitals:   05/10/24 2258 05/10/24 2259 05/10/24 2300 05/10/24 2315  BP:   (!) 176/73   Pulse: 88 84 84 78  Resp: 20 18 19  (!) 24  Temp:      TempSrc:      SpO2: 98% 98% 97% 96%   General: Not in acute distress HEENT:       Eyes: PERRL, EOMI, no jaundice       ENT: No discharge from the ears and nose, no pharynx injection, no tonsillar enlargement.        Neck: No JVD, no bruit, no mass felt. Heme: No neck lymph node enlargement. Cardiac: S1/S2, RRR, No murmurs, No gallops or rubs. Respiratory: No rales, wheezing, rhonchi or rubs. GI: Soft, nondistended, nontender, no rebound pain, no organomegaly, BS present. GU: No hematuria Ext: No pitting leg edema bilaterally. 1+DP/PT pulse bilaterally. Musculoskeletal: Has left hip tenderness.  Left leg is shortened and externally rotated. Skin: No rashes.  Neuro: Alert, cranial nerves II-XII grossly  intact, moves all extremities. Psych: Patient is not psychotic, no suicidal or hemocidal ideation.  Labs on Admission: I have personally reviewed following labs and imaging studies  CBC: Recent Labs  Lab 05/10/24 2237  WBC 12.4*  NEUTROABS 9.5*  HGB 12.5  HCT 38.4  MCV 93.0  PLT 243   Basic Metabolic Panel: Recent Labs  Lab 05/10/24 2237  NA 140  K 4.0  CL 102  CO2 28  GLUCOSE 189*  BUN 49*  CREATININE 1.34*  CALCIUM 8.7*   GFR: CrCl cannot be calculated (Unknown ideal weight.). Liver Function Tests: Recent Labs  Lab 05/10/24 2237  AST 14*  ALT 17  ALKPHOS 76  BILITOT 0.4  PROT 7.1  ALBUMIN 3.8   No results for input(s): LIPASE, AMYLASE in the last 168 hours. No results for input(s): AMMONIA in the last 168 hours. Coagulation Profile: No results for input(s): INR, PROTIME in the last 168 hours. Cardiac Enzymes: No results for input(s): CKTOTAL, CKMB, CKMBINDEX, TROPONINI in the last 168 hours. BNP (last 3 results) No results for input(s): PROBNP in the last 8760  hours. HbA1C: No results for input(s): HGBA1C in the last 72 hours. CBG: No results for input(s): GLUCAP in the last 168 hours. Lipid Profile: No results for input(s): CHOL, HDL, LDLCALC, TRIG, CHOLHDL, LDLDIRECT in the last 72 hours. Thyroid  Function Tests: No results for input(s): TSH, T4TOTAL, FREET4, T3FREE, THYROIDAB in the last 72 hours. Anemia Panel: No results for input(s): VITAMINB12, FOLATE, FERRITIN, TIBC, IRON, RETICCTPCT in the last 72 hours. Urine analysis: No results found for: COLORURINE, APPEARANCEUR, LABSPEC, PHURINE, GLUCOSEU, HGBUR, BILIRUBINUR, KETONESUR, PROTEINUR, UROBILINOGEN, NITRITE, LEUKOCYTESUR Sepsis Labs: @LABRCNTIP (procalcitonin:4,lacticidven:4) )No results found for this or any previous visit (from the past 240 hours).   Radiological Exams on Admission:   Assessment/Plan Principal Problem:   Closed left hip fracture [facility]) Active Problems:   Fall at home, initial encounter   Chronic combined systolic and diastolic CHF (congestive heart failure) (HCC)   CAD (coronary artery disease)   Myocardial injury   Hypertension   Asthma, chronic   Chronic kidney disease, stage 3a (HCC)   Atrial fibrillation, chronic (HCC)   Protein-calorie malnutrition, severe   Assessment and Plan:  Closed left hip fracture (HCC): X-ray showed acute comminuted intertrochanteric fracture of the left hip with varus angulation. No neurovascular compromise. Orthopedic surgeon, Dr. Ezra was consulted.  Given patient's old age, history of systolic CHF with EF of 35-40% and A-fib, consulted Dr. Wilburn of cardiology for presurgical clearance.  - will admit to Med-surg bed as inpt - Pain control: prn morphine , percocet and tyleno - When necessary Zofran  for nausea - Robaxin  for muscle spasm - Lidoderm  patch for pain - type and cross - INR/PTT  Fall at home, initial encounter -PT/OT when able to (not ordered  now)  Chronic combined systolic and diastolic CHF (congestive heart failure) (HCC): 2D echo on 10/30/2023 showed EF of 35-40% with grade 1 diastolic dysfunction.  Patient does not have leg edema, no SOB.  CHF seems to be compensated. - Continue home torsemide  20 mg daily - Check BNP  CAD (coronary artery disease) and myocardial injury: Troponin 27, no chest pain.  Likely demand ischemia. -Patient stopped taking aspirin  - Continue Coreg  - Trend troponin  Hypertension -IV hydralazine  as needed - Continue amlodipine  and Coreg   Asthma, chronic: Stable -Bronchodilators, and prn Mucinex   Chronic kidney disease, stage 3a (HCC): Renal function slightly worsening to baseline.  Recent baseline creatinine 1.26 on 10/30/2023.  Her  creatinine is 1.34, BUN 49, GFR 35. - Monitor renal function closely with BMP  Atrial fibrillation, chronic (HCC): Heart rate 70-90s.  Patient is not taking anticoagulants. -Continue Coreg  3 125 mg twice daily  Protein-calorie malnutrition, severe: Body weight 38.4 kg, BMI 16. - Ensure - Nutrition consult     DVT ppx: SCD  Code Status: DNR  Family Communication:  patient's son and granddaughter at bed side.    Disposition Plan:  Anticipate discharge back to previous environment  Consults called:  Dr. Rolan of Ortho and Dr. Wilburn of card are consulted.  Admission status and Level of care: Telemetryas inpt        Dispo: The patient is from: Home              Anticipated d/c is to: SNF              Anticipated d/c date is: 2 days              Patient currently is not medically stable to d/c.    Severity of Illness:  The appropriate patient status for this patient is INPATIENT. Inpatient status is judged to be reasonable and necessary in order to provide the required intensity of service to ensure the patient's safety. The patient's presenting symptoms, physical exam findings, and initial radiographic and laboratory data in the context of their chronic  comorbidities is felt to place them at high risk for further clinical deterioration. Furthermore, it is not anticipated that the patient will be medically stable for discharge from the hospital within 2 midnights of admission.   * I certify that at the point of admission it is my clinical judgment that the patient will require inpatient hospital care spanning beyond 2 midnights from the point of admission due to high intensity of service, high risk for further deterioration and high frequency of surveillance required.*       Date of Service 05/10/2024    Caleb Exon [facility]   If 7PM-7AM, please contact night-coverage [email or web address] 05/10/2024, 11:53 PM     [1]  Allergies Allergen Reactions   Shellfish Allergy Other (See Comments) and Hives   Atorvastatin Other (See Comments)   Iodine Other (See Comments)   "

## 2024-05-10 NOTE — ED Provider Notes (Signed)
 "  [facility] Provider Note   Event Date/Time   First MD Initiated Contact with Patient 05/10/24 2214     (approximate) History  Fall  HPI Anna Barrett is a 89 y.o. female with a past medical history of hypertension, CAD, osteoporosis, hyperlipidemia, and ischemic cardiomyopathy who presents after mechanical fall from standing via EMS complaining of left hip pain with shortening and external rotation of the left leg.  Patient was given 4 mg of Zofran  and 75 mcg of fentanyl  en route.  Patient denies any head trauma, loss of consciousness, or other pains at this time.  Patient denies blood thinners ROS: Patient currently denies any vision changes, tinnitus, difficulty speaking, facial droop, sore throat, chest pain, shortness of breath, abdominal pain, nausea/vomiting/diarrhea, dysuria, or weakness/numbness/paresthesias in any extremity   Physical Exam  Triage Vital Signs: ED Triage Vitals  Encounter Vitals Group     BP      Girls Systolic BP Percentile      Girls Diastolic BP Percentile      Boys Systolic BP Percentile      Boys Diastolic BP Percentile      Pulse      Resp      Temp      Temp src      SpO2      Weight      Height      Head Circumference      Peak Flow      Pain Score      Pain Loc      Pain Education      Exclude from Growth Chart    Most recent vital signs: Vitals:   05/10/24 2259 05/10/24 2300  BP:    Pulse: 84 84  Resp: 18 19  Temp:    SpO2: 98% 97%   General: Awake, oriented x4. CV:  Good peripheral perfusion. Resp:  Normal effort. Abd:  No distention. Other:  Elderly cachectic Caucasian female resting in stretcher in moderate distress secondary to left hip pain ED Results / Procedures / Treatments  Labs (all labs ordered are listed, but only abnormal results are displayed) Labs Reviewed  CBC WITH DIFFERENTIAL/PLATELET - Abnormal; Notable for the following components:      Result Value   WBC 12.4 (*)    Neutro Abs  9.5 (*)    Abs Immature Granulocytes 0.09 (*)    All other components within normal limits  TROPONIN T, HIGH SENSITIVITY - Abnormal; Notable for the following components:   Troponin T High Sensitivity 27 (*)    All other components within normal limits  COMPREHENSIVE METABOLIC PANEL WITH GFR   RADIOLOGY ED MD interpretation: X-ray of the left hip shows acute comminuted and mildly displaced intertrochanteric fracture of the left femur  Single view portable chest x-ray shows - All radiology independently interpreted and agree with radiology assessment Official radiology report(s): No results found. PROCEDURES: Critical Care performed: Yes, see critical care procedure note(s) Procedures CRITICAL CARE Performed by: Jenesa Foresta K Marieann Zipp  Total critical care time: 33 minutes  Critical care time was exclusive of separately billable procedures and treating other patients.  Critical care was necessary to treat or prevent imminent or life-threatening deterioration.  Critical care was time spent personally by me on the following activities: development of treatment plan with patient and/or surrogate as well as nursing, discussions with consultants, evaluation of patient's response to treatment, examination of patient, obtaining history from patient or surrogate, ordering and performing  treatments and interventions, ordering and review of laboratory studies, ordering and review of radiographic studies, pulse oximetry and re-evaluation of patient's condition.  MEDICATIONS ORDERED IN ED: Medications  ondansetron  (ZOFRAN ) injection 4 mg (4 mg Intravenous Given 05/10/24 2234)   IMPRESSION / MDM / ASSESSMENT AND PLAN / ED COURSE  I reviewed the triage vital signs and the nursing notes.                             The patient is on the cardiac monitor to evaluate for evidence of arrhythmia and/or significant heart rate changes. Patient's presentation is most consistent with acute presentation with  potential threat to life or bodily function. Patient is a 89 year old female who presents via EMS after mechanical fall with left hip pain. DDx: Left femur fracture, left hip fracture, syncope, ACS Plan: CBC, CMP, troponin, chest x-ray, x-ray of the left hip  Radiologic evaluation shows intertrochanteric fracture through the left femur.  I spoke to Dr. Ezra and orthopedic surgery who agrees to take patient for surgical intervention.  I have spoken to Dr. Hilma on the hospitalist service who agrees to accept this patient for further evaluation.  Dispo: Admit to medicine   FINAL CLINICAL IMPRESSION(S) / ED DIAGNOSES   Final diagnoses:  Closed displaced intertrochanteric fracture of left femur, initial encounter (HCC)  Fall, initial encounter  Acute hypoxic respiratory failure (HCC)   Rx / DC Orders   ED Discharge Orders     None      Note:  This document was prepared using Dragon voice recognition software and may include unintentional dictation errors.   Jossie Artist POUR, MD 05/10/24 [id number]  "

## 2024-05-10 NOTE — ED Triage Notes (Signed)
 Pt allegedly stumbled and fell, she is not on blood thinners. Medic reports shortening and rotation to her left leg. She fell on to hardwood.   Medic vitals   191/82 90hr 94%ra 18rr  22 Rt hand  fentanyl 

## 2024-05-11 ENCOUNTER — Inpatient Hospital Stay: Admitting: Anesthesiology

## 2024-05-11 ENCOUNTER — Inpatient Hospital Stay

## 2024-05-11 ENCOUNTER — Encounter
Admission: EM | Disposition: A | Discharge status: Skilled Nursing Facility | Payer: Self-pay | Source: Home / Self Care | Attending: Obstetrics and Gynecology

## 2024-05-11 HISTORY — PX: INTRAMEDULLARY (IM) NAIL INTERTROCHANTERIC: SHX5875

## 2024-05-11 LAB — CBC
HCT: 35.8 % — ABNORMAL LOW (ref 36.0–46.0)
Hemoglobin: 11.4 g/dL — ABNORMAL LOW (ref 12.0–15.0)
MCH: 30.1 pg (ref 26.0–34.0)
MCHC: 31.8 g/dL (ref 30.0–36.0)
MCV: 94.5 fL (ref 80.0–100.0)
Platelets: 217 K/uL (ref 150–400)
RBC: 3.79 MIL/uL — ABNORMAL LOW (ref 3.87–5.11)
RDW: 12.8 % (ref 11.5–15.5)
WBC: 9.6 K/uL (ref 4.0–10.5)
nRBC: 0 % (ref 0.0–0.2)

## 2024-05-11 LAB — BASIC METABOLIC PANEL WITH GFR
Anion gap: 8 (ref 5–15)
BUN: 51 mg/dL — ABNORMAL HIGH (ref 8–23)
CO2: 31 mmol/L (ref 22–32)
Calcium: 9.2 mg/dL (ref 8.9–10.3)
Chloride: 102 mmol/L (ref 98–111)
Creatinine, Ser: 1.48 mg/dL — ABNORMAL HIGH (ref 0.44–1.00)
GFR, Estimated: 31 mL/min — ABNORMAL LOW
Glucose, Bld: 167 mg/dL — ABNORMAL HIGH (ref 70–99)
Potassium: 5.3 mmol/L — ABNORMAL HIGH (ref 3.5–5.1)
Sodium: 141 mmol/L (ref 135–145)

## 2024-05-11 LAB — PRO BRAIN NATRIURETIC PEPTIDE: Pro Brain Natriuretic Peptide: 867 pg/mL — ABNORMAL HIGH

## 2024-05-11 LAB — PROTIME-INR
INR: 1.1 (ref 0.8–1.2)
Prothrombin Time: 14.9 s (ref 11.4–15.2)

## 2024-05-11 LAB — APTT: aPTT: 31 s (ref 24–36)

## 2024-05-11 LAB — TYPE AND SCREEN

## 2024-05-11 LAB — TROPONIN T, HIGH SENSITIVITY: Troponin T High Sensitivity: 29 ng/L — ABNORMAL HIGH (ref 0–19)

## 2024-05-11 MED ORDER — BUPIVACAINE HCL (PF) 0.5 % IJ SOLN
INTRAMUSCULAR | Status: AC
  Filled 2024-05-11: qty 30

## 2024-05-11 MED ORDER — SENNA 8.6 MG PO TABS
1.0000 | ORAL_TABLET | Freq: Two times a day (BID) | ORAL | Status: DC
  Administered 2024-05-11 – 2024-05-17 (×11): 8.6 mg via ORAL
  Filled 2024-05-11 (×12): qty 1

## 2024-05-11 MED ORDER — FLUTICASONE FUROATE-VILANTEROL 100-25 MCG/ACT IN AEPB
1.0000 | INHALATION_SPRAY | Freq: Every day | RESPIRATORY_TRACT | Status: DC
  Administered 2024-05-12 – 2024-05-16 (×5): 1 via RESPIRATORY_TRACT
  Filled 2024-05-11: qty 28

## 2024-05-11 MED ORDER — OXYCODONE HCL 5 MG PO TABS
2.5000 mg | ORAL_TABLET | ORAL | Status: DC | PRN
  Administered 2024-05-11 – 2024-05-13 (×2): 2.5 mg via ORAL
  Filled 2024-05-11 (×2): qty 1

## 2024-05-11 MED ORDER — AMLODIPINE BESYLATE 5 MG PO TABS
2.5000 mg | ORAL_TABLET | Freq: Every day | ORAL | Status: DC
  Administered 2024-05-11 – 2024-05-12 (×2): 2.5 mg via ORAL
  Filled 2024-05-11 (×2): qty 1

## 2024-05-11 MED ORDER — CEFAZOLIN SODIUM-DEXTROSE 2-4 GM/100ML-% IV SOLN
INTRAVENOUS | Status: AC
  Filled 2024-05-11: qty 100

## 2024-05-11 MED ORDER — ENSURE ENLIVE PO LIQD
237.0000 mL | Freq: Two times a day (BID) | ORAL | Status: DC
  Filled 2024-05-11: qty 237

## 2024-05-11 MED ORDER — PANTOPRAZOLE SODIUM 40 MG PO TBEC
40.0000 mg | DELAYED_RELEASE_TABLET | Freq: Every day | ORAL | Status: DC
  Administered 2024-05-11 – 2024-05-17 (×7): 40 mg via ORAL
  Filled 2024-05-11 (×7): qty 1

## 2024-05-11 MED ORDER — PROPOFOL 500 MG/50ML IV EMUL
INTRAVENOUS | Status: DC | PRN
  Administered 2024-05-11: 10 ug/kg/min via INTRAVENOUS

## 2024-05-11 MED ORDER — KETAMINE HCL 50 MG/5ML IJ SOSY
PREFILLED_SYRINGE | INTRAMUSCULAR | Status: DC | PRN
  Administered 2024-05-11: 10 mg via INTRAVENOUS

## 2024-05-11 MED ORDER — FENTANYL CITRATE (PF) 100 MCG/2ML IJ SOLN
INTRAMUSCULAR | Status: DC | PRN
  Administered 2024-05-11: 25 ug via INTRAVENOUS

## 2024-05-11 MED ORDER — DROPERIDOL 2.5 MG/ML IJ SOLN
INTRAMUSCULAR | Status: AC
  Filled 2024-05-11: qty 2

## 2024-05-11 MED ORDER — DROPERIDOL 2.5 MG/ML IJ SOLN: 0.6250 mg | Freq: Once | INTRAMUSCULAR | Status: DC

## 2024-05-11 MED ORDER — HYDROMORPHONE HCL 1 MG/ML IJ SOLN: 0.2500 mg | INTRAMUSCULAR | Status: DC | PRN

## 2024-05-11 MED ORDER — GLUCERNA SHAKE PO LIQD
237.0000 mL | Freq: Three times a day (TID) | ORAL | Status: DC
  Administered 2024-05-12 – 2024-05-17 (×16): 237 mL via ORAL
  Filled 2024-05-11 (×2): qty 237

## 2024-05-11 MED ORDER — CARVEDILOL 3.125 MG PO TABS
3.1250 mg | ORAL_TABLET | Freq: Two times a day (BID) | ORAL | Status: DC
  Administered 2024-05-11 – 2024-05-17 (×13): 3.125 mg via ORAL
  Filled 2024-05-11 (×13): qty 1

## 2024-05-11 MED ORDER — SODIUM ZIRCONIUM CYCLOSILICATE 10 G PO PACK
10.0000 g | PACK | Freq: Once | ORAL | Status: AC
  Administered 2024-05-11: 10 g via ORAL
  Filled 2024-05-11: qty 1

## 2024-05-11 MED ORDER — PROPOFOL 10 MG/ML IV BOLUS
INTRAVENOUS | Status: DC | PRN
  Administered 2024-05-11: 20 mg via INTRAVENOUS

## 2024-05-11 MED ORDER — NITROGLYCERIN 0.4 MG SL SUBL: 0.4000 mg | SUBLINGUAL_TABLET | SUBLINGUAL | Status: DC | PRN

## 2024-05-11 MED ORDER — BUPIVACAINE HCL (PF) 0.5 % IJ SOLN
INTRAMUSCULAR | Status: DC | PRN
  Administered 2024-05-11: 10 mL

## 2024-05-11 MED ORDER — PHENYLEPHRINE 80 MCG/ML (10ML) SYRINGE FOR IV PUSH (FOR BLOOD PRESSURE SUPPORT)
PREFILLED_SYRINGE | INTRAVENOUS | Status: DC | PRN
  Administered 2024-05-11 (×3): 80 ug via INTRAVENOUS
  Administered 2024-05-11: 160 ug via INTRAVENOUS
  Administered 2024-05-11 (×2): 80 ug via INTRAVENOUS
  Administered 2024-05-11: 160 ug via INTRAVENOUS
  Administered 2024-05-11: 80 ug via INTRAVENOUS

## 2024-05-11 MED ORDER — CHLORHEXIDINE GLUCONATE CLOTH 2 % EX PADS: 6.0000 | MEDICATED_PAD | Freq: Every day | CUTANEOUS | Status: DC

## 2024-05-11 MED ORDER — OXYCODONE HCL 5 MG PO TABS
5.0000 mg | ORAL_TABLET | ORAL | Status: DC | PRN
  Administered 2024-05-11 – 2024-05-15 (×4): 5 mg via ORAL
  Filled 2024-05-11 (×5): qty 1

## 2024-05-11 MED ORDER — HYDROMORPHONE HCL 1 MG/ML IJ SOLN
0.4000 mg | INTRAMUSCULAR | Status: DC | PRN
  Administered 2024-05-12 (×2): 0.4 mg via INTRAVENOUS
  Filled 2024-05-11 (×2): qty 0.5

## 2024-05-11 MED ORDER — PROPOFOL 1000 MG/100ML IV EMUL
INTRAVENOUS | Status: AC
  Filled 2024-05-11: qty 100

## 2024-05-11 MED ORDER — ACETAMINOPHEN 325 MG PO TABS
650.0000 mg | ORAL_TABLET | Freq: Three times a day (TID) | ORAL | Status: AC
  Administered 2024-05-11 – 2024-05-14 (×8): 650 mg via ORAL
  Filled 2024-05-11 (×9): qty 2

## 2024-05-11 MED ORDER — ONDANSETRON HCL 4 MG/2ML IJ SOLN
INTRAMUSCULAR | Status: AC
  Filled 2024-05-11: qty 2

## 2024-05-11 MED ORDER — BUPIVACAINE-EPINEPHRINE (PF) 0.5% -1:200000 IJ SOLN
INTRAMUSCULAR | Status: AC
  Filled 2024-05-11: qty 10

## 2024-05-11 MED ORDER — 0.9 % SODIUM CHLORIDE (POUR BTL) OPTIME
TOPICAL | Status: DC | PRN
  Administered 2024-05-11: 120 mL

## 2024-05-11 MED ORDER — ONDANSETRON HCL 4 MG/2ML IJ SOLN
4.0000 mg | Freq: Once | INTRAMUSCULAR | Status: AC
  Administered 2024-05-11: 4 mg via INTRAVENOUS

## 2024-05-11 MED ORDER — TRANEXAMIC ACID-NACL 1000-0.7 MG/100ML-% IV SOLN
INTRAVENOUS | Status: AC
  Filled 2024-05-11: qty 100

## 2024-05-11 MED ORDER — VITAMIN D 25 MCG (1000 UNIT) PO TABS
1000.0000 [IU] | ORAL_TABLET | Freq: Two times a day (BID) | ORAL | Status: DC
  Administered 2024-05-11 – 2024-05-17 (×12): 1000 [IU] via ORAL
  Filled 2024-05-11 (×13): qty 1

## 2024-05-11 MED ORDER — ONDANSETRON HCL 4 MG/2ML IJ SOLN
INTRAMUSCULAR | Status: DC | PRN
  Administered 2024-05-11: 4 mg via INTRAVENOUS

## 2024-05-11 MED ORDER — FENTANYL CITRATE (PF) 100 MCG/2ML IJ SOLN
INTRAMUSCULAR | Status: AC
  Filled 2024-05-11: qty 2

## 2024-05-11 MED ORDER — LACTATED RINGERS IV SOLN: INTRAVENOUS | Status: DC

## 2024-05-11 MED ORDER — PHENYLEPHRINE HCL-NACL 20-0.9 MG/250ML-% IV SOLN
INTRAVENOUS | Status: AC
  Filled 2024-05-11: qty 250

## 2024-05-11 MED ORDER — ADULT MULTIVITAMIN W/MINERALS CH
1.0000 | ORAL_TABLET | Freq: Every day | ORAL | Status: DC
  Administered 2024-05-12 – 2024-05-17 (×6): 1 via ORAL
  Filled 2024-05-11 (×6): qty 1

## 2024-05-11 MED ORDER — TORSEMIDE 20 MG PO TABS
20.0000 mg | ORAL_TABLET | Freq: Every day | ORAL | Status: DC
  Administered 2024-05-11: 20 mg via ORAL
  Filled 2024-05-11: qty 1

## 2024-05-11 MED ORDER — CEFAZOLIN SODIUM-DEXTROSE 2-4 GM/100ML-% IV SOLN
2.0000 g | Freq: Three times a day (TID) | INTRAVENOUS | Status: AC
  Administered 2024-05-11 – 2024-05-12 (×3): 2 g via INTRAVENOUS
  Filled 2024-05-11 (×2): qty 100

## 2024-05-11 MED ORDER — PHENYLEPHRINE HCL-NACL 20-0.9 MG/250ML-% IV SOLN
INTRAVENOUS | Status: DC | PRN
  Administered 2024-05-11: 60 ug/min via INTRAVENOUS

## 2024-05-11 MED ORDER — HYDROMORPHONE HCL 1 MG/ML IJ SOLN: 0.2000 mg | INTRAMUSCULAR | Status: DC | PRN

## 2024-05-11 MED ORDER — KETAMINE HCL 50 MG/5ML IJ SOSY
PREFILLED_SYRINGE | INTRAMUSCULAR | Status: AC
  Filled 2024-05-11: qty 5

## 2024-05-11 MED ORDER — TRANEXAMIC ACID-NACL 1000-0.7 MG/100ML-% IV SOLN
1000.0000 mg | INTRAVENOUS | Status: AC
  Administered 2024-05-11: 1000 mg via INTRAVENOUS

## 2024-05-11 MED ORDER — BUPIVACAINE LIPOSOME 1.3 % IJ SUSP
INTRAMUSCULAR | Status: AC
  Filled 2024-05-11: qty 10

## 2024-05-11 MED ORDER — BUPIVACAINE HCL (PF) 0.5 % IJ SOLN
INTRAMUSCULAR | Status: DC | PRN
  Administered 2024-05-11: 1.8 mL

## 2024-05-11 MED ORDER — BUPIVACAINE LIPOSOME 1.3 % IJ SUSP
INTRAMUSCULAR | Status: DC | PRN
  Administered 2024-05-11: 10 mL

## 2024-05-11 NOTE — Progress Notes (Signed)
 " PROGRESS NOTE    [name]  FMW:[id number] DOB: 12-31-1922 DOA: 05/10/2024 PCP: Lenon Layman ORN, MD  Chief Complaint  Patient presents with   [facility] Course:  Anna Barrett is a 89 y.o. female with medical history significant of  sCHF with EF 35-40%, HTN,  HLD, CAD, MI, asthma, GERD, CKD-3A, A-fib not on anticoagulants, anemia, who presents with mechanical fall and left hip pain.    Per his son and granddaughter at the bedside, patient fell accidentally when she was walking at home round 10:00 p.m.  No LOC.  Her granddaughter is very sure that the patient did not have head or neck injury.  Family does not want to do CT scan of the head and neck.  Patient developed left hip pain, which is constant, severe, sharp, nonradiating, aggravated by movement.  Left leg is shortened and externally rotated.  No chest pain, cough, SOB.  Patient has nausea, no vomiting, diarrhea or abdominal pain.  No symptoms of UTI.  Workup revealed intertrochanteric fracture of the left hip, hospital course as below  Subjective: Patient was examined at the bedside, new to me today. Was very sleepy postop, family son and grandchildren present at the bedside Discussed updates and answered all questions   Objective: Vitals:   05/11/24 0020 05/11/24 0102 05/11/24 0425 05/11/24 0737  BP: (!) 156/69  121/66 114/65  Pulse: 89  84 85  Resp: 16  18 16   Temp: (!) 97.5 F (36.4 C)  97.6 F (36.4 C) 98.1 F (36.7 C)  TempSrc: Oral  Oral   SpO2: 99%  99% 100%  Weight:  44.1 kg    Height:  4' 11 (1.499 m)     No intake or output data in the 24 hours ending 05/11/24 0849 Filed Weights   05/11/24 0102  Weight: 44.1 kg    Examination: General: Not in acute distress Cardiac: S1/S2, RRR, No murmurs, No gallops or rubs Respiratory: No rales, wheezing, rhonchi or rubs GI: Soft, nondistended, nontender, no rebound pain, no organomegaly, BS present. Ext: No pitting leg edema bilaterally. 1+DP/PT  pulse bilaterally. Musculoskeletal: Has left hip tenderness.  Left leg is shortened and externally rotated. Skin: No rashes.  Neuro: Drowsy post op  Assessment & Plan:  Closed left hip fracture (HCC) X-ray showed intertrochanteric fracture of the left hip Seen by cardiology for risk stratification for surgery, intermediate to high risk not prohibitive, no further workup recommended Seen by orthopedics, appreciate recs S/p left hip cephalomedullary nailing 12/30 Pain management with Tylenol , low-dose opioids PT/OT tomorrow  Fall at home, initial encounter -PT/OT when able to  Mild hyperkalemia Lokelma  1 dose  Chronic kidney disease, stage 3a (HCC):  Baseline cr 1.1-1.2, slightly higher than normal ~1.4 Hold torsemide , was n.p.o. today Monitor creatinine   Chronic combined systolic and diastolic CHF (congestive heart failure) (HCC) 2D echo on 10/30/2023 showed EF of 35-40% with grade 1 diastolic dysfunction. CHF seems to be compensated. Home torsemide  on hold due to being NPO, mildly elevated Cr   CAD (coronary artery disease) and myocardial injury: Troponin 27, no chest pain.  Likely demand ischemia. Patient stopped taking aspirin  Continue Coreg  Trend troponin   Hypertension IV hydralazine  as needed Continue amlodipine  and Coreg    Asthma, chronic: Stable Bronchodilators, and prn Mucinex   Atrial fibrillation, chronic (HCC): Patient is not taking anticoagulants. -Continue Coreg  3 125 mg twice daily   Protein-calorie malnutrition, severe: Body weight 38.4 kg, BMI 19.6 - Ensure - Nutrition  consult   DVT prophylaxis: SCD, chemical prophylaxis tomorrow   Code Status: Limited: Do not attempt resuscitation (DNR) -DNR-LIMITED -Do Not Intubate/DNI  Disposition: TBD  Consultants:  Treatment Team:  Consulting Physician: Ezra Jackquline RAMAN, MD Cardiology  Procedures:  S/p left hip cephalomedullary nailing 12/30  Antimicrobials:  Anti-infectives (From admission, onward)     Start     Dose/Rate Route Frequency Ordered Stop   05/10/24 2330  azithromycin  (ZITHROMAX ) 500 mg in sodium chloride  0.9 % 250 mL IVPB  Status:  Discontinued        500 mg 250 mL/hr over 60 Minutes Intravenous  Once 05/10/24 2316 05/10/24 2316   05/10/24 2330  cefTRIAXone  (ROCEPHIN ) 1 g in sodium chloride  0.9 % 100 mL IVPB  Status:  Discontinued        1 g 200 mL/hr over 30 Minutes Intravenous  Once 05/10/24 2316 05/10/24 2316       Data Reviewed: I have personally reviewed following labs and imaging studies CBC: Recent Labs  Lab 05/10/24 2237  WBC 12.4*  NEUTROABS 9.5*  HGB 12.5  HCT 38.4  MCV 93.0  PLT 243   Basic Metabolic Panel: Recent Labs  Lab 05/10/24 2237  NA 140  K 4.0  CL 102  CO2 28  GLUCOSE 189*  BUN 49*  CREATININE 1.34*  CALCIUM 8.7*   GFR: Estimated Creatinine Clearance: 15.6 mL/min (A) (by C-G formula based on SCr of 1.34 mg/dL (H)). Liver Function Tests: Recent Labs  Lab 05/10/24 2237  AST 14*  ALT 17  ALKPHOS 76  BILITOT 0.4  PROT 7.1  ALBUMIN 3.8   CBG: No results for input(s): GLUCAP in the last 168 hours.  No results found for this or any previous visit (from the past 240 hours).   Radiology Studies: DG Chest Port 1 View Result Date: 05/11/2024 EXAM: 1 VIEW(S) XRAY OF THE CHEST 05/10/2024 10:54:57 PM COMPARISON: 10/28/2023 CLINICAL HISTORY: chest pain after fall FINDINGS: LUNGS AND PLEURA: COPD with hyperinflation. No focal pulmonary opacity. No pleural effusion. No pneumothorax. HEART AND MEDIASTINUM: Aortic arch calcifications. No acute abnormality of the cardiac and mediastinal silhouettes. BONES AND SOFT TISSUES: Old healed right humeral neck fracture. IMPRESSION: 1. No acute findings. Electronically signed by: Franky Stanford MD 05/11/2024 01:46 AM EST RP Workstation: [id number]   DG Hip Unilat W or Wo Pelvis 2-3 Views Left Result Date: 05/11/2024 EXAM: 2 OR MORE VIEW(S) XRAY OF THE PELVIS AND LEFT HIP 05/10/2024 10:54:57 PM  COMPARISON: None available. CLINICAL HISTORY: fall, w pain and shortening/external rotation FINDINGS: BONES AND JOINTS: SI joints are symmetric. Partially visualized intramedullary nail fixation of the right hip in place. Acute comminuted intertrochanteric fracture of the left hip with varus angulation. SOFT TISSUES: Vascular calcifications. IMPRESSION: 1. Acute comminuted intertrochanteric fracture of the left hip with varus angulation. 2. Partially visualized intramedullary nail fixation of the right hip in place. Electronically signed by: Franky Stanford MD 05/11/2024 01:45 AM EST RP Workstation: [id number]    Scheduled Meds:  amLODipine   2.5 mg Oral Daily   carvedilol   3.125 mg Oral BID WC   cholecalciferol   1,000 Units Oral BID   feeding supplement  237 mL Oral BID BM   fluticasone  furoate-vilanterol  1 puff Inhalation Daily   lidocaine   1 patch Transdermal Q24H   pantoprazole   40 mg Oral Daily   senna  1 tablet Oral BID   torsemide   20 mg Oral Daily   Continuous Infusions:   LOS: 1 day  MDM: Patient is  high risk for one or more organ failure.  They necessitate ongoing hospitalization for continued IV therapies and subsequent lab monitoring. Total time spent interpreting labs and vitals, reviewing the medical record, coordinating care amongst consultants and care team members, directly assessing and discussing care with the patient and/or family: 55 min Laree Lock, MD [facility]  To contact the attending physician between 7A-7P please use Epic Chat. To contact the covering physician during after hours 7P-7A, please review Amion.  05/11/2024, 8:49 AM   *This document has been created with the assistance of dictation software. Please excuse typographical errors. *   "

## 2024-05-11 NOTE — Plan of Care (Signed)
" °  Problem: Safety: Goal: Ability to remain free from injury will improve Outcome: Progressing   Problem: Pain Managment: Goal: General experience of comfort will improve and/or be controlled Outcome: Progressing   Problem: Coping: Goal: Level of anxiety will decrease Outcome: Progressing   Problem: Nutrition: Goal: Adequate nutrition will be maintained Outcome: Progressing   "

## 2024-05-11 NOTE — Consult Note (Addendum)
 "                                                                ORTHOPAEDIC CONSULTATION  REQUESTING PHYSICIAN: Jerelene Critchley, MD  Chief Complaint:   Left hip pain  History of Present Illness: Anna Barrett is a 89 y.o. female who had a GLF yesterday.  The patient noted immediate hip pain and inability to ambulate.  The patient ambulates with a walker at baseline.  The patient lives alone but has around the clock help from her family. Pain is worse with any sort of movement.  X-rays in the emergency department show a left intertrochanteric hip fracture.  Past Medical History:  Diagnosis Date   Asthma    Carotid stenosis    Headache    MIGRAINES   History of MI (myocardial infarction)    Hyperglycemia    Hyperlipidemia    Hypertension    Ischemic cardiomyopathy    Myocardial infarction [facility]) 1962   Osteoporosis    Parathyroid adenoma    Past Surgical History:  Procedure Laterality Date   ABDOMINAL HYSTERECTOMY     APPENDECTOMY     CATARACT EXTRACTION Bilateral    ESOPHAGOGASTRODUODENOSCOPY (EGD) WITH PROPOFOL  N/A 04/10/2020   Procedure: ESOPHAGOGASTRODUODENOSCOPY (EGD) WITH PROPOFOL ;  Surgeon: Toledo, Ladell POUR, MD;  Location: [facility] ENDOSCOPY;  Service: Gastroenterology;  Laterality: N/A;   ESOPHAGOGASTRODUODENOSCOPY (EGD) WITH PROPOFOL  N/A 06/06/2021   Procedure: ESOPHAGOGASTRODUODENOSCOPY (EGD) WITH PROPOFOL ;  Surgeon: Therisa Bi, MD;  Location: [facility] ENDOSCOPY;  Service: Gastroenterology;  Laterality: N/A;   ESOPHAGOGASTRODUODENOSCOPY (EGD) WITH PROPOFOL  N/A 06/21/2021   Procedure: ESOPHAGOGASTRODUODENOSCOPY (EGD) WITH PROPOFOL ;  Surgeon: Toledo, Ladell POUR, MD;  Location: [facility] ENDOSCOPY;  Service: Gastroenterology;  Laterality: N/A;   EYE SURGERY     FRACTURE SURGERY     ORIF ANKLE FRACTURE Left 04/17/2015   Procedure: OPEN REDUCTION INTERNAL FIXATION (ORIF) ANKLE FRACTURE;  Surgeon: Norleen JINNY Maltos, MD;  Location: [facility] ORS;  Service: Orthopedics;  Laterality: Left;   Social  History   Socioeconomic History   Marital status: Widowed    Spouse name: Not on file   Number of children: Not on file   Years of education: Not on file   Highest education level: Not on file  Occupational History   Not on file  Tobacco Use   Smoking status: Never   Smokeless tobacco: Never  Vaping Use   Vaping status: Never Used  Substance and Sexual Activity   Alcohol use: No   Drug use: Never   Sexual activity: Never  Other Topics Concern   Not on file  Social History Narrative   Not on file   Social Drivers of Health   Tobacco Use: Low Risk (05/10/2024)   Patient History    Smoking Tobacco Use: Never    Smokeless Tobacco Use: Never    Passive Exposure: Not on file  Financial Resource Strain: Low Risk  (02/11/2023)   Received from [facility]   Overall Financial Resource Strain (CARDIA)    Difficulty of Paying Living Expenses: Not hard at all  Food Insecurity: No Food Insecurity (05/11/2024)   Epic    Worried About Running Out of Food in the Last Year: Never true    Ran Out of Food in the  Last Year: Never true  Transportation Needs: No Transportation Needs (05/11/2024)   Epic    Lack of Transportation (Medical): No    Lack of Transportation (Non-Medical): No  Physical Activity: Not on file  Stress: Not on file  Social Connections: Moderately Integrated (05/11/2024)   Social Connection and Isolation Panel    Frequency of Communication with Friends and Family: More than three times a week    Frequency of Social Gatherings with Friends and Family: More than three times a week    Attends Religious Services: More than 4 times per year    Active Member of [employer] or Organizations: Yes    Attends Banker Meetings: More than 4 times per year    Marital Status: Widowed  Depression (PHQ2-9): Not on file  Alcohol Screen: Not on file  Housing: Low Risk (05/11/2024)   Epic    Unable to Pay for Housing in the Last Year: No    Number of Times  Moved in the Last Year: 0    Homeless in the Last Year: No  Utilities: Not At Risk (05/11/2024)   Epic    Threatened with loss of utilities: No  Health Literacy: Not on file   Family History  Problem Relation Age of Onset   Heart disease Mother    Heart disease Father    Allergies[1] Prior to Admission medications  Medication Sig Start Date End Date Taking? Authorizing Provider  albuterol  (VENTOLIN  HFA) 108 (90 Base) MCG/ACT inhaler Inhale 2 puffs into the lungs every 4 (four) hours as needed for wheezing. 01/08/23 05/10/24 Yes [provider]  amLODipine  (NORVASC ) 2.5 MG tablet Take 2.5 mg by mouth daily.   Yes [provider]  carvedilol  (COREG ) 3.125 MG tablet Take 3.125 mg by mouth 2 (two) times daily with a meal. 07/17/22  Yes [provider]  Cholecalciferol  25 MCG (1000 UT) tablet Take 1,000 Units by mouth 2 (two) times daily.   Yes [provider]  feeding supplement (ENSURE ENLIVE / ENSURE PLUS) LIQD Take 237 mLs by mouth 3 (three) times daily between meals. Patient taking differently: Take 237 mLs by mouth 2 (two) times daily between meals. 06/06/21  Yes Fausto Sor A, DO  nitroGLYCERIN  (NITROSTAT ) 0.4 MG SL tablet Place 0.4 mg under the tongue every 5 (five) minutes as needed for chest pain.   Yes [provider]  omeprazole (PRILOSEC) 10 MG capsule Take 10 mg by mouth daily.   Yes [provider]  ondansetron  (ZOFRAN -ODT) 4 MG disintegrating tablet Allow 1-2 tablets to dissolve in your mouth every 8 hours as needed for nausea/vomiting 05/18/22  Yes Gordan Huxley, MD  potassium chloride  (KLOR-CON ) 10 MEQ tablet Take 10 mEq by mouth at bedtime.   Yes [provider]  senna (SENOKOT) 8.6 MG TABS tablet Take 1 tablet by mouth 2 (two) times daily.   Yes [provider]  torsemide  (DEMADEX ) 20 MG tablet Take 20 mg by mouth daily.   Yes [provider]  WIXELA INHUB 100-50 MCG/ACT AEPB Inhale 1 puff into  the lungs at bedtime.   Yes [provider]   Recent Labs    05/10/24 2237 05/11/24 0129  WBC 12.4*  --   HGB 12.5  --   HCT 38.4  --   PLT 243  --   K 4.0  --   CL 102  --   CO2 28  --   BUN 49*  --   CREATININE 1.34*  --  GLUCOSE 189*  --   CALCIUM 8.7*  --   INR  --  1.1   DG Chest Port 1 View Result Date: 05/11/2024 EXAM: 1 VIEW(S) XRAY OF THE CHEST 05/10/2024 10:54:57 PM COMPARISON: 10/28/2023 CLINICAL HISTORY: chest pain after fall FINDINGS: LUNGS AND PLEURA: COPD with hyperinflation. No focal pulmonary opacity. No pleural effusion. No pneumothorax. HEART AND MEDIASTINUM: Aortic arch calcifications. No acute abnormality of the cardiac and mediastinal silhouettes. BONES AND SOFT TISSUES: Old healed right humeral neck fracture. IMPRESSION: 1. No acute findings. Electronically signed by: Franky Stanford MD 05/11/2024 01:46 AM EST RP Workstation: [id number]   DG Hip Unilat W or Wo Pelvis 2-3 Views Left Result Date: 05/11/2024 EXAM: 2 OR MORE VIEW(S) XRAY OF THE PELVIS AND LEFT HIP 05/10/2024 10:54:57 PM COMPARISON: None available. CLINICAL HISTORY: fall, w pain and shortening/external rotation FINDINGS: BONES AND JOINTS: SI joints are symmetric. Partially visualized intramedullary nail fixation of the right hip in place. Acute comminuted intertrochanteric fracture of the left hip with varus angulation. SOFT TISSUES: Vascular calcifications. IMPRESSION: 1. Acute comminuted intertrochanteric fracture of the left hip with varus angulation. 2. Partially visualized intramedullary nail fixation of the right hip in place. Electronically signed by: Franky Stanford MD 05/11/2024 01:45 AM EST RP Workstation: [id number]     Positive ROS: All other systems have been reviewed and were otherwise negative with the exception of those mentioned in the HPI and as above.  Physical Exam: BP 114/65 (BP Location: Right Arm)   Pulse 85   Temp 98.1 F (36.7 C)   Resp 16   Ht 4' 11 (1.499 m)   Wt  44.1 kg   SpO2 100%   BMI 19.64 kg/m  General:  Alert, no acute distress Psychiatric:  Patient with normal mood and affect    Orthopaedic Exam:  left Lower Extremity: + DF/PF/EHL SILT grossly over foot Foot wwp +Log roll/axial load   Imaging:  As above: left intertrochanteric hip fracture  Assessment/Plan: Anna Barrett is a 89 y.o. female with a left intertrochanteric hip fracture  1. I discussed the various treatment options including both surgical and non-surgical management of the fracture with the patient and/or family (medical PoA). We discussed the high risk of perioperative complications due to patient's age, dementia, and other co-morbidities.  Specific risks discussed included but were not limited to infection, injury to nearby anatomic structures, malunion, nonunion, hardware related complications, need for revision surgery, continued pain and difficulty with ambulation, blood loss/hematoma, MI, stroke, death, blood clot/pulmonary embolism.  After discussion of risks, benefits, and alternatives to surgery, the family and/or patient were in agreement to proceed with surgery. The goals of surgery would be to provide adequate pain relief and allow for mobilization. Plan for surgery is left hip cephalomedullary nailing today, 05/11/2024. 2. NPO until OR 3. Hold anticoagulation in advance of OR 4. PT postop 5. DVT ppx   Jackquline GORMAN Barrack 05/11/2024 8:03 AM      [1]  Allergies Allergen Reactions   Shellfish Allergy Other (See Comments) and Hives   Atorvastatin Other (See Comments)   Iodine Other (See Comments)   "

## 2024-05-11 NOTE — Consult Note (Signed)
 " [facility] CLINIC CARDIOLOGY CONSULT NOTE       Patient ID: Anna Barrett MRN: [id number] DOB/AGE: [birth year] 88 y.o.  Admit date: 05/10/2024 Referring Physician Dr. Caleb Exon Primary Physician Lenon Layman ORN, MD  Primary Cardiologist None Reason for Consultation POC  HPI: Anna Barrett is a 89 y.o. female  with a past medical history of chronic HFrEF, paroxysmal A-fib not on anticoagulation, hypertension, chronic kidney disease who presented to the ED on 05/10/2024 for fall and left hip pain.  Found to have fracture and orthopedics recommended surgery.  Cardiology was consulted for further evaluation.   Patient presented for evaluation after fall at home and L hip pain. Workup in the ED notable for creatinine 1.34, potassium 4.0, hemoglobin 12.5, WBC 12.4. Troponins 27 > 29, BNP 867. EKG in the ED normal sinus rhythm chronic left bundle branch block rate 78 bpm. CXR without acute findings.   At the time of my evaluation this AM, patient is resting in bed with son at bedside. They state she currently lives at home but has 24/7 care.  Last night she sustained at fall (suspect mechanical, denies any dizziness/LOC). They report that she has been stable from a cardiac perspective over the last few months. She has random episode of chest heaviness typically in the mornings after a coughing spell, these occur sporadically and not every day. She denies any exertional chest pain or SOB.   Review of systems complete and found to be negative unless listed above    Past Medical History:  Diagnosis Date   Asthma    Carotid stenosis    Headache    MIGRAINES   History of MI (myocardial infarction)    Hyperglycemia    Hyperlipidemia    Hypertension    Ischemic cardiomyopathy    Myocardial infarction [facility]) 1962   Osteoporosis    Parathyroid adenoma     Past Surgical History:  Procedure Laterality Date   ABDOMINAL HYSTERECTOMY     APPENDECTOMY     CATARACT EXTRACTION Bilateral     ESOPHAGOGASTRODUODENOSCOPY (EGD) WITH PROPOFOL  N/A 04/10/2020   Procedure: ESOPHAGOGASTRODUODENOSCOPY (EGD) WITH PROPOFOL ;  Surgeon: Toledo, Ladell POUR, MD;  Location: [facility] ENDOSCOPY;  Service: Gastroenterology;  Laterality: N/A;   ESOPHAGOGASTRODUODENOSCOPY (EGD) WITH PROPOFOL  N/A 06/06/2021   Procedure: ESOPHAGOGASTRODUODENOSCOPY (EGD) WITH PROPOFOL ;  Surgeon: Therisa Bi, MD;  Location: [facility] ENDOSCOPY;  Service: Gastroenterology;  Laterality: N/A;   ESOPHAGOGASTRODUODENOSCOPY (EGD) WITH PROPOFOL  N/A 06/21/2021   Procedure: ESOPHAGOGASTRODUODENOSCOPY (EGD) WITH PROPOFOL ;  Surgeon: Toledo, Ladell POUR, MD;  Location: [facility] ENDOSCOPY;  Service: Gastroenterology;  Laterality: N/A;   EYE SURGERY     FRACTURE SURGERY     ORIF ANKLE FRACTURE Left 04/17/2015   Procedure: OPEN REDUCTION INTERNAL FIXATION (ORIF) ANKLE FRACTURE;  Surgeon: Norleen JINNY Maltos, MD;  Location: [facility] ORS;  Service: Orthopedics;  Laterality: Left;    Medications Prior to Admission  Medication Sig Dispense Refill Last Dose/Taking   albuterol  (VENTOLIN  HFA) 108 (90 Base) MCG/ACT inhaler Inhale 2 puffs into the lungs every 4 (four) hours as needed for wheezing.   Unknown   amLODipine  (NORVASC ) 2.5 MG tablet Take 2.5 mg by mouth daily.   05/10/2024 Morning   carvedilol  (COREG ) 3.125 MG tablet Take 3.125 mg by mouth 2 (two) times daily with a meal.   05/10/2024 at  7:00 PM   Cholecalciferol  25 MCG (1000 UT) tablet Take 1,000 Units by mouth 2 (two) times daily.   05/10/2024 at  7:00 PM   feeding supplement (  ENSURE ENLIVE / ENSURE PLUS) LIQD Take 237 mLs by mouth 3 (three) times daily between meals. (Patient taking differently: Take 237 mLs by mouth 2 (two) times daily between meals.) 237 mL 12 Taking Differently   nitroGLYCERIN  (NITROSTAT ) 0.4 MG SL tablet Place 0.4 mg under the tongue every 5 (five) minutes as needed for chest pain.   Unknown   omeprazole (PRILOSEC) 10 MG capsule Take 10 mg by mouth daily.   05/10/2024 at  8:00 AM   ondansetron   (ZOFRAN -ODT) 4 MG disintegrating tablet Allow 1-2 tablets to dissolve in your mouth every 8 hours as needed for nausea/vomiting 30 tablet 0 Unknown   potassium chloride  (KLOR-CON ) 10 MEQ tablet Take 10 mEq by mouth at bedtime.   05/09/2024 at  7:00 PM   senna (SENOKOT) 8.6 MG TABS tablet Take 1 tablet by mouth 2 (two) times daily.   05/10/2024 at  7:00 PM   torsemide  (DEMADEX ) 20 MG tablet Take 20 mg by mouth daily.   05/10/2024 at  8:00 AM   WIXELA INHUB 100-50 MCG/ACT AEPB Inhale 1 puff into the lungs at bedtime.   05/09/2024 Bedtime   Social History   Socioeconomic History   Marital status: Widowed    Spouse name: Not on file   Number of children: Not on file   Years of education: Not on file   Highest education level: Not on file  Occupational History   Not on file  Tobacco Use   Smoking status: Never   Smokeless tobacco: Never  Vaping Use   Vaping status: Never Used  Substance and Sexual Activity   Alcohol use: No   Drug use: Never   Sexual activity: Never  Other Topics Concern   Not on file  Social History Narrative   Not on file   Social Drivers of Health   Tobacco Use: Low Risk (05/10/2024)   Patient History    Smoking Tobacco Use: Never    Smokeless Tobacco Use: Never    Passive Exposure: Not on file  Financial Resource Strain: Low Risk  (02/11/2023)   Received from [facility]   Overall Financial Resource Strain (CARDIA)    Difficulty of Paying Living Expenses: Not hard at all  Food Insecurity: No Food Insecurity (05/11/2024)   Epic    Worried About Radiation Protection Practitioner of Food in the Last Year: Never true    Ran Out of Food in the Last Year: Never true  Transportation Needs: No Transportation Needs (05/11/2024)   Epic    Lack of Transportation (Medical): No    Lack of Transportation (Non-Medical): No  Physical Activity: Not on file  Stress: Not on file  Social Connections: Moderately Integrated (05/11/2024)   Social Connection and Isolation Panel     Frequency of Communication with Friends and Family: More than three times a week    Frequency of Social Gatherings with Friends and Family: More than three times a week    Attends Religious Services: More than 4 times per year    Active Member of [employer] or Organizations: Yes    Attends Banker Meetings: More than 4 times per year    Marital Status: Widowed  Intimate Partner Violence: Not At Risk (05/11/2024)   Epic    Fear of Current or Ex-Partner: No    Emotionally Abused: No    Physically Abused: No    Sexually Abused: No  Depression (PHQ2-9): Not on file  Alcohol Screen: Not on file  Housing: Low Risk (  05/11/2024)   Epic    Unable to Pay for Housing in the Last Year: No    Number of Times Moved in the Last Year: 0    Homeless in the Last Year: No  Utilities: Not At Risk (05/11/2024)   Epic    Threatened with loss of utilities: No  Health Literacy: Not on file    Family History  Problem Relation Age of Onset   Heart disease Mother    Heart disease Father      Vitals:   05/11/24 0000 05/11/24 0020 05/11/24 0102 05/11/24 0425  BP: 129/75 (!) 156/69  121/66  Pulse: 66 89  84  Resp: 18 16  18   Temp:  (!) 97.5 F (36.4 C)  97.6 F (36.4 C)  TempSrc:  Oral  Oral  SpO2: 99% 99%  99%  Weight:   44.1 kg   Height:   4' 11 (1.499 m)     PHYSICAL EXAM General: Chronically ill appearing elderly female, well nourished, in no acute distress. HEENT: Normocephalic and atraumatic. Neck: No JVD.  Lungs: Normal respiratory effort on 2L [place]. Clear bilaterally to auscultation. No wheezes, crackles, rhonchi.  Heart: HRRR. Normal S1 and S2 without gallops or murmurs.  Abdomen: Non-distended appearing.  Msk: Normal strength and tone for age. Extremities: Warm and well perfused. No clubbing, cyanosis. No edema.  Neuro: Alert and oriented X 3. Psych: Answers questions appropriately.   Labs: Basic Metabolic Panel: Recent Labs    05/10/24 2237  NA 140  K 4.0  CL  102  CO2 28  GLUCOSE 189*  BUN 49*  CREATININE 1.34*  CALCIUM 8.7*   Liver Function Tests: Recent Labs    05/10/24 2237  AST 14*  ALT 17  ALKPHOS 76  BILITOT 0.4  PROT 7.1  ALBUMIN 3.8   No results for input(s): LIPASE, AMYLASE in the last 72 hours. CBC: Recent Labs    05/10/24 2237  WBC 12.4*  NEUTROABS 9.5*  HGB 12.5  HCT 38.4  MCV 93.0  PLT 243   Cardiac Enzymes: No results for input(s): CKTOTAL, CKMB, CKMBINDEX, TROPONINIHS in the last 72 hours. BNP: No results for input(s): BNP in the last 72 hours. D-Dimer: No results for input(s): DDIMER in the last 72 hours. Hemoglobin A1C: No results for input(s): HGBA1C in the last 72 hours. Fasting Lipid Panel: No results for input(s): CHOL, HDL, LDLCALC, TRIG, CHOLHDL, LDLDIRECT in the last 72 hours. Thyroid  Function Tests: No results for input(s): TSH, T4TOTAL, T3FREE, THYROIDAB in the last 72 hours.  Invalid input(s): FREET3 Anemia Panel: No results for input(s): VITAMINB12, FOLATE, FERRITIN, TIBC, IRON, RETICCTPCT in the last 72 hours.   Radiology: [facility] Chest Port 1 View Result Date: 05/11/2024 EXAM: 1 VIEW(S) XRAY OF THE CHEST 05/10/2024 10:54:57 PM COMPARISON: 10/28/2023 CLINICAL HISTORY: chest pain after fall FINDINGS: LUNGS AND PLEURA: COPD with hyperinflation. No focal pulmonary opacity. No pleural effusion. No pneumothorax. HEART AND MEDIASTINUM: Aortic arch calcifications. No acute abnormality of the cardiac and mediastinal silhouettes. BONES AND SOFT TISSUES: Old healed right humeral neck fracture. IMPRESSION: 1. No acute findings. Electronically signed by: Franky Stanford MD 05/11/2024 01:46 AM EST RP Workstation: [id number]   DG Hip Unilat W or Wo Pelvis 2-3 Views Left Result Date: 05/11/2024 EXAM: 2 OR MORE VIEW(S) XRAY OF THE PELVIS AND LEFT HIP 05/10/2024 10:54:57 PM COMPARISON: None available. CLINICAL HISTORY: fall, w pain and shortening/external rotation  FINDINGS: BONES AND JOINTS: SI joints are symmetric. Partially visualized intramedullary nail fixation  of the right hip in place. Acute comminuted intertrochanteric fracture of the left hip with varus angulation. SOFT TISSUES: Vascular calcifications. IMPRESSION: 1. Acute comminuted intertrochanteric fracture of the left hip with varus angulation. 2. Partially visualized intramedullary nail fixation of the right hip in place. Electronically signed by: Franky Stanford MD 05/11/2024 01:45 AM EST RP Workstation: [id number]   Echo 06/2022 [facility]:   1. Technically very difficult study.    2. The left ventricle is normal in size with normal wall thickness.    3. The left ventricular systolic function is severely decreased, LVEF is  visually estimated at 30-35%.    4. There is anterior, septal, and apical hypokinesis. This pattern can be  seen in stress-induced cardiomyopathy however clinical correlation required.    5. Mitral annular calcification is present (moderate).    6. There is mild aortic regurgitation.    7. The right ventricle is normal in size, with mildly reduced systolic  function.   ECHO 10/2023: 1. Left ventricular ejection fraction, by estimation, is 35 to 40%. The  left ventricle has moderately decreased function. The left ventricle  demonstrates global hypokinesis. There is moderate concentric left  ventricular hypertrophy. Left ventricular  diastolic parameters are consistent with Grade I diastolic dysfunction  (impaired relaxation).   2. Right ventricular systolic function is normal. The right ventricular  size is normal.   3. The mitral valve is myxomatous. Mild mitral valve regurgitation.   4. The aortic valve is grossly normal. Aortic valve regurgitation is  mild. Aortic valve sclerosis/calcification is present, without any  evidence of aortic stenosis.   LHC 2013 DUH: DIAGNOSTIC SUMMARY   Coronary Artery Disease                    RCA system:  normal       Left Main:  normal                      No significant CAD indicated       LAD system:  normal                Left Ventriculogram       LCX system:  normal                    Ejection Fraction:   72%   TELEMETRY (personally reviewed): not on tele  EKG (personally reviewed): normal sinus rhythm chronic left bundle branch block rate 78 bpm  Data reviewed by me 05/11/2024: last 24h vitals tele labs imaging I/O ED provider note, admission H&P  Principal Problem:   Closed left hip fracture (HCC) Active Problems:   Hypertension   Asthma, chronic   CAD (coronary artery disease)   Myocardial injury   Chronic kidney disease, stage 3a (HCC)   Atrial fibrillation, chronic (HCC)   Protein-calorie malnutrition, severe   Fall at home, initial encounter   Chronic combined systolic and diastolic CHF (congestive heart failure) (HCC)    ASSESSMENT AND PLAN:  Anna Barrett is a 89 y.o. female  with a past medical history of chronic HFrEF, paroxysmal A-fib not on anticoagulation, hypertension, chronic kidney disease who presented to the ED on 05/10/2024 for fall and left hip pain.  Found to have fracture and orthopedics recommended surgery.  Cardiology was consulted for further evaluation.   # Preoperative cardiac evaluation # Chronic HFrEF # Paroxysmal atrial fibrillation # Hypertension # Chronic kidney disease Patient presented after fall at home. Has  L hip fx, ortho planning for surgery today. No recent exertional angina or SOB. EKG without acute ischemic changes.  -Given patient's age, baseline functional status, comorbid conditions, she will be at increased, intermediate-high risk for surgery. This is not prohibitive. Would not recommend any further cardiac evaluation prior to surgery.  -Minimal and flat troponin most consistent with demand/supply mismatch and not ACS.   This patient's plan of care was discussed and created with Dr. Wilburn and he is in agreement.  Signed: Danita Bloch, PA-C  05/11/2024, 7:15  AM [facility] Cardiology      "

## 2024-05-11 NOTE — Anesthesia Postprocedure Evaluation (Signed)
"   Anesthesia Post Note  Patient: Anna Barrett  Procedure(s) Performed: FIXATION, FRACTURE, INTERTROCHANTERIC, WITH INTRAMEDULLARY ROD (Left: Hip)  Patient location during evaluation: PACU Anesthesia Type: General Level of consciousness: awake and alert Pain management: pain level controlled Vital Signs Assessment: post-procedure vital signs reviewed and stable Respiratory status: spontaneous breathing, nonlabored ventilation, respiratory function stable and patient connected to nasal cannula oxygen Cardiovascular status: blood pressure returned to baseline and stable Postop Assessment: no apparent nausea or vomiting and spinal receding Anesthetic complications: no   No notable events documented.   Last Vitals:  Vitals:   05/11/24 1341 05/11/24 1345  BP: 99/74 122/62  Pulse: 74 77  Resp: 19 19  Temp:    SpO2: 100% 98%    Last Pain:  Vitals:   05/11/24 1009  TempSrc: Temporal  PainSc: 0-No pain                 [name]      "

## 2024-05-11 NOTE — Progress Notes (Signed)
 Initial Nutrition Assessment  DOCUMENTATION CODES:   Not applicable  INTERVENTION:   Glucerna Shake po TID, each supplement provides 220 kcal and 10 grams of protein   Magic cup TID with meals, each supplement provides 290 kcal and 9 grams of protein  MVI po daily   Dysphagia 3 diet   Pt at high refeed risk; recommend monitor potassium, magnesium  and phosphorus labs daily until stable  Daily weights   NUTRITION DIAGNOSIS:   Increased nutrient needs related to hip fracture as evidenced by estimated needs.  GOAL:   Patient will meet greater than or equal to 90% of their needs  MONITOR:   PO intake, Supplement acceptance, Labs, Weight trends, Skin, I & O's  REASON FOR ASSESSMENT:   Consult Hip fracture protocol  ASSESSMENT:   89 y/o female with h/o CHF, HTN, CAD, MI, Afib, asthma, ischemic cardiomyopathy, parathyroid adenoma and CKD III who is admitted with L hip fracture after a fall.  RD working remotely.  Pt in the OR at time of RD visit today. Pt is known to this RD from a recent previous admission. Son reports pt with poor appetite and oral intake at baseline. Son reports that patient will sometimes eat a whole sandwich and will sometimes skip meals. Pt just mainly snacks throughout the day. Pt does sometimes drink strawberry Ensure at home. Pt enjoys ice cream and sweets but is not big on yogurt or soups. Pt eating only bites during her last admission. Pt requires her food to be chopped. RD will add supplements and MVI to help pt meet her estimated needs. Pt is likely at refeed risk. Per chart, pt is up ~ 12lbs from her last admission if her bed weight is correct. Pt previously diagnosed with severe malnutrition. RD will obtain history and exam at follow up.    Medications reviewed and include: aspirin , D3, protonix , senokot, lokelma , torsemide , LRS @75ml /hr  Labs reviewed: K 5.3(H), BUN 51(H), creat 1.48(H)l  NUTRITION - FOCUSED PHYSICAL EXAM: Unable to perform  at this time  Diet Order:   Diet Order             Diet NPO time specified Except for: Sips with Meds, Ice Chips  Diet effective midnight                  EDUCATION NEEDS:   Not appropriate for education at this time  Skin:  Skin Assessment: Reviewed RN Assessment  Last BM:  12/26- per pt  Height:   Ht Readings from Last 1 Encounters:  05/11/24 4' 11 (1.499 m)    Weight:   Wt Readings from Last 1 Encounters:  05/11/24 44.1 kg    Ideal Body Weight:  44.5 kg  BMI:  Body mass index is 19.64 kg/m.  Estimated Nutritional Needs:   Kcal:  1100-1300kcal/day  Protein:  55-65g/day  Fluid:  1.1-1.3L/day  Augustin Shams MS, RD, LDN If unable to be reached, please send secure chat to RD inpatient available from 8:00a-4:00p daily

## 2024-05-11 NOTE — Plan of Care (Signed)

## 2024-05-11 NOTE — Anesthesia Preprocedure Evaluation (Addendum)
 "                                  Anesthesia Evaluation  Patient identified by MRN, date of birth, ID band Patient confused  General Assessment Comment:Pt awake. Aware to rough location and name. Unaware of current event. Follows commands.   Reviewed: Allergy & Precautions, H&P , NPO status , Patient's Chart, lab work & pertinent test results  Airway Mallampati: II  TM Distance: >3 FB Neck ROM: full    Dental  (+) Poor Dentition, Missing, Chipped   Pulmonary asthma   2L [place]         Cardiovascular hypertension, + CAD, + Past MI, + Peripheral Vascular Disease and +CHF  Normal cardiovascular exam+ dysrhythmias (pAF, LBBB)   Echo 06/2022 [facility]:   1. Technically very difficult study.    2. The left ventricle is normal in size with normal wall thickness.    3. The left ventricular systolic function is severely decreased, LVEF is  visually estimated at 30-35%.    4. There is anterior, septal, and apical hypokinesis. This pattern can be  seen in stress-induced cardiomyopathy however clinical correlation required.    5. Mitral annular calcification is present (moderate).    6. There is mild aortic regurgitation.    7. The right ventricle is normal in size, with mildly reduced systolic  function.    ECHO 10/2023: 1. Left ventricular ejection fraction, by estimation, is 35 to 40%. The  left ventricle has moderately decreased function. The left ventricle  demonstrates global hypokinesis. There is moderate concentric left  ventricular hypertrophy. Left ventricular  diastolic parameters are consistent with Grade I diastolic dysfunction  (impaired relaxation).   2. Right ventricular systolic function is normal. The right ventricular  size is normal.   3. The mitral valve is myxomatous. Mild mitral valve regurgitation.   4. The aortic valve is grossly normal. Aortic valve regurgitation is  mild. Aortic valve sclerosis/calcification is present, without any  evidence of aortic  stenosis.    LHC 2013 DU[facility]: DIAGNOSTIC SUMMARY   Coronary Artery Disease                    RCA system:  normal       Left Main:  normal                     No significant CAD indicated       LAD system:  normal                Left Ventriculogram       LCX system:  normal                    Ejection Fraction:   72%    TELEMETRY (personally reviewed): not on tele   EKG (personally reviewed): normal sinus rhythm chronic left bundle branch block rate 78 bpm    Neuro/Psych negative neurological ROS  negative psych ROS   GI/Hepatic negative GI ROS, Neg liver ROS,,,  Endo/Other  negative endocrine ROS    Renal/GU CRFRenal disease     Musculoskeletal   Abdominal Normal abdominal exam  (+)   Peds  Hematology negative hematology ROS (+)   Anesthesia Other Findings Per Cardiology: # Preoperative cardiac evaluation # Chronic HFrEF # Paroxysmal atrial fibrillation # Hypertension # Chronic kidney disease Patient presented after fall at home. Has L  hip fx, ortho planning for surgery today. No recent exertional angina or SOB. EKG without acute ischemic changes.  -Given patient's age, baseline functional status, comorbid conditions, she will be at increased, intermediate-high risk for surgery. This is not prohibitive. Would not recommend any further cardiac evaluation prior to surgery.  -Minimal and flat troponin most consistent with demand/supply mismatch and not ACS.   Past Medical History: No date: Asthma No date: Carotid stenosis No date: Headache     Comment:  MIGRAINES No date: History of MI (myocardial infarction) No date: Hyperglycemia No date: Hyperlipidemia No date: Hypertension No date: Ischemic cardiomyopathy 1962: Myocardial infarction (HCC) No date: Osteoporosis No date: Parathyroid adenoma  Past Surgical History: No date: ABDOMINAL HYSTERECTOMY No date: APPENDECTOMY No date: CATARACT EXTRACTION; Bilateral 04/10/2020: ESOPHAGOGASTRODUODENOSCOPY (EGD) WITH  PROPOFOL ; N/A     Comment:  Procedure: ESOPHAGOGASTRODUODENOSCOPY (EGD) WITH               PROPOFOL ;  Surgeon: Toledo, Ladell POUR, MD;  Location:               [facility] ENDOSCOPY;  Service: Gastroenterology;  Laterality:               N/A; 06/06/2021: ESOPHAGOGASTRODUODENOSCOPY (EGD) WITH PROPOFOL ; N/A     Comment:  Procedure: ESOPHAGOGASTRODUODENOSCOPY (EGD) WITH               PROPOFOL ;  Surgeon: Therisa Bi, MD;  Location: [facility]               ENDOSCOPY;  Service: Gastroenterology;  Laterality: N/A; 06/21/2021: ESOPHAGOGASTRODUODENOSCOPY (EGD) WITH PROPOFOL ; N/A     Comment:  Procedure: ESOPHAGOGASTRODUODENOSCOPY (EGD) WITH               PROPOFOL ;  Surgeon: Toledo, Ladell POUR, MD;  Location:               [facility] ENDOSCOPY;  Service: Gastroenterology;  Laterality:               N/A; No date: EYE SURGERY No date: FRACTURE SURGERY 04/17/2015: ORIF ANKLE FRACTURE; Left     Comment:  Procedure: OPEN REDUCTION INTERNAL FIXATION (ORIF) ANKLE              FRACTURE;  Surgeon: Norleen JINNY Maltos, MD;  Location: [facility]               ORS;  Service: Orthopedics;  Laterality: Left;  BMI    Body Mass Index: 19.64 kg/m      Reproductive/Obstetrics negative OB ROS                              Anesthesia Physical Anesthesia Plan  ASA: 3  Anesthesia Plan: General ETT   Post-op Pain Management: Ofirmev  IV (intra-op)* and Regional block*   Induction: Intravenous  PONV Risk Score and Plan: 2 and Ondansetron  and Dexamethasone   Airway Management Planned: Oral ETT  Additional Equipment:   Intra-op Plan:   Post-operative Plan: Extubation in OR  Informed Consent: I have reviewed the patients History and Physical, chart, labs and discussed the procedure including the risks, benefits and alternatives for the proposed anesthesia with the patient or authorized representative who has indicated his/her understanding and acceptance.   Patient has DNR.  Discussed DNR with power of attorney.    Dental Advisory Given and Consent reviewed with POA  Plan Discussed with: CRNA and Surgeon  Anesthesia Plan Comments: (Son was present with consent. He wishes  patient does not have chest compressions if needed. He allows cardioversion/defib. )         Anesthesia Quick Evaluation  "

## 2024-05-11 NOTE — Transfer of Care (Signed)
 Immediate Anesthesia Transfer of Care Note  Patient: Anna Barrett  Procedure(s) Performed: FIXATION, FRACTURE, INTERTROCHANTERIC, WITH INTRAMEDULLARY ROD (Left: Hip)  Patient Location: PACU  Anesthesia Type:Spinal  Level of Consciousness: awake and alert   Airway & Oxygen Therapy: Patient Spontanous Breathing  Post-op Assessment: Report given to RN and Post -op Vital signs reviewed and stable  Post vital signs: Reviewed and stable  Last Vitals:  Vitals Value Taken Time  BP 99/74 05/11/24 13:41  Temp    Pulse 75 05/11/24 13:42  Resp 16 05/11/24 13:42  SpO2 100 % 05/11/24 13:42  Vitals shown include unfiled device data.  Last Pain:  Vitals:   05/11/24 1009  TempSrc: Temporal  PainSc: 0-No pain         Complications: No notable events documented.

## 2024-05-11 NOTE — Anesthesia Procedure Notes (Addendum)
 Spinal  Patient location during procedure: OR Start time: 05/11/2024 10:45 AM End time: 05/11/2024 10:51 AM Reason for block: surgical anesthesia  Staffing Performed: anesthesiologist  Authorized by: Vicci Camellia Glatter, MD   Performed by: Belinda, Summer, CRNA  Preanesthetic Checklist Completed: patient identified, IV checked, site marked, risks and benefits discussed, surgical consent, monitors and equipment checked and pre-op evaluation Spinal Block Patient position: left lateral decubitus Prep: ChloraPrep Patient monitoring: heart rate, continuous pulse ox and blood pressure Approach: midline Location: L3-4 Injection technique: single-shot Needle Needle type: Pencan  Needle gauge: 24 G Needle length: 10 cm  Additional Notes IV functioning, monitors applied to pt. Expiration date of kit checked and confirmed to be in date. Sterile prep and drape, hand hygiene and sterile gloved used. Pt was positioned and spine was prepped in sterile fashion. Skin was anesthetized with lidocaine . Free flow of clear CSF obtained prior to injecting local anesthetic into CSF x 1 attempt. Needle was carefully withdrawn, and pt tolerated procedure well. Loss of motor and sensory on exam post injection.

## 2024-05-11 NOTE — TOC CM/SW Note (Signed)
 Transition of Care [facility]) CM/SW Note    Transition of Care [facility]) - Inpatient Brief Assessment   Patient Details  Name: Anna Barrett MRN: [id number] Date of Birth: [birth year]  Transition of Care [facility]) CM/SW Contact:    Alvaro Louder, LCSW Phone Number: 05/11/2024, 12:54 PM   Clinical Narrative:  Per chart review TOC consulted for SNF Placement/ Home health. LCSWA to follow recommendations placed by PT and OT.  TOC to follow for discharge  Transition of Care Asessment: Insurance and Status: Insurance coverage has been reviewed Patient has primary care physician: Yes Home environment has been reviewed: Single family home Prior level of function:: Ox1 Prior/Current Home Services: No current home services Social Drivers of Health Review: SDOH reviewed no interventions necessary Readmission risk has been reviewed: Yes Transition of care needs: no transition of care needs at this time

## 2024-05-11 NOTE — Op Note (Signed)
 DATE OF SURGERY: 05/11/2024  PREOPERATIVE DIAGNOSIS: Left intertrochanteric hip fracture  POSTOPERATIVE DIAGNOSIS: Left intertrochanteric hip fracture  PROCEDURE: Intramedullary nailing of left femur with cephalomedullary device (CPT [phone])  SURGEON: Jackquline CANDIE Barrack, MD  ANESTHESIA: MAC, spinal  EBL: 100 cc  IVF: per anesthesia record  COMPONENTS:  Implant Name Type Inv. Item Serial No. Manufacturer Lot No. LRB No. Used Action  NAIL LOCK CANN [id number] [id number] LT - [id number] Nail NAIL LOCK CANN [id number] 130D LT  [employer] [id number] Left 1 Implanted  SCREW LAG COMPR KIT 100/95 - [id number] Screw SCREW LAG COMPR KIT 100/95  [employer] [id number] Left 1 Implanted  SCREW TRIGEN LOW PROF 5.0X37.5 - [id number] Screw SCREW TRIGEN LOW PROF 5.0X37.5  [employer] [id number] Left 1 Implanted     INDICATIONS: Anna Barrett is a 89 y.o. female who sustained an intertrochanteric fracture after a fall. Risks and benefits of intramedullary nailing were explained to the patient and family. Risks include but are not limited to bleeding, infection, injury to tissues, nerves, vessels, nonunion/malunion, hardware failure, limb length discrepancy/hip rotation mismatch and risks of anesthesia. The patient and family understand these risks, have completed an informed consent, and wish to proceed.   PROCEDURE:  The patient was brought into the operating room. After administering anesthesia, the patient was placed in the supine position on the Hana table. The uninjured leg was placed in an extended position while the injured lower extremity was placed in longitudinal traction. The fracture was reduced using longitudinal traction and internal rotation. The adequacy of reduction was verified fluoroscopically in AP and lateral projections and found to be acceptable. The lateral aspect of the hip and thigh were prepped with ChloraPrep solution before being draped  sterilely. Preoperative IV antibiotics and TXA were administered. A timeout was performed to verify the appropriate surgical site, patient, and procedure.    The greater trochanter was identified and an incision was made about 3 fingerbreadths above the tip of the greater trochanter. The incision was carried down through the subcutaneous tissues to expose the gluteal fascia. This was split the length of the incision, providing access to the tip of the trochanter. Under fluoroscopic guidance, a guidewire was drilled through the tip of the trochanter into the proximal metaphysis to the level of the lesser trochanter. After verifying its position fluoroscopically in AP and lateral projections, it was overreamed with the opening reamer to the level of the lesser trochanter. At this stage the guide wire was pushed into the femur and a long kocher was used to retrieve it.  A ball tipped guide wire was then placed through the opening of the GT to the level just proximal to the patella. A measuring device was used to select the correct length nail. A reamer was used over the guide wire to prepare a path for the nail. The nail was assembled on the back table and placed along the path of the wire to the appropriate depth with ease.   The guide system for the lag screw was positioned and advanced through an incision over the lateral aspect of the proximal femur. The guidewire was drilled up through the femoral nail and into the femoral neck. After verifying its position in the femoral neck and head in both AP and lateral projections, the guidewire was measured and appropriate sized lag screw was selected.  The channel for the compression screw was drilled and antirotation bar was placed.  Lag screw was drilled  and placed in appropriate position.  The compression screw was then placed.  Appropriate compression was achieved.  The set screw was locked in place. Again, the adequacy of hardware position and fracture reduction  was verified fluoroscopically in AP and lateral projections.  Distally, perfect circles were obtained and a drill hole through the proximal portion of the oblong hole was placed. A screw was measured and placed through the nail. Flouroscopy was used to confirm accurate placement of the distal interlocking screw through the nail. Final flouroscopic shots were the obtained.    The wounds were irrigated thoroughly with sterile saline solution. Local anesthetic was injected into the wounds. Deep fascia was closed with 0-Vicryl. The subcutaneous tissues were closed using 2-0 Vicryl interrupted sutures. The skin was closed using staples. Sterile occlusive dressings were applied to all wounds. The patient was then transferred to the recovery room in satisfactory condition.   POSTOPERATIVE PLAN: The patient will be WBAT on the operative extremity. Lovenox  40mg /day x 4 weeks to start on POD#1. Perioperative IV antibiotics x 24 hours. PT/OT on POD#1.

## 2024-05-12 DIAGNOSIS — D62 Acute posthemorrhagic anemia: Secondary | ICD-10-CM | POA: Insufficient documentation

## 2024-05-12 DIAGNOSIS — S72002A Fracture of unspecified part of neck of left femur, initial encounter for closed fracture: Secondary | ICD-10-CM | POA: Diagnosis not present

## 2024-05-12 LAB — CBC
HCT: 24.8 % — ABNORMAL LOW (ref 36.0–46.0)
Hemoglobin: 8.2 g/dL — ABNORMAL LOW (ref 12.0–15.0)
MCH: 30.7 pg (ref 26.0–34.0)
MCHC: 33.1 g/dL (ref 30.0–36.0)
MCV: 92.9 fL (ref 80.0–100.0)
Platelets: 166 K/uL (ref 150–400)
RBC: 2.67 MIL/uL — ABNORMAL LOW (ref 3.87–5.11)
RDW: 13.1 % (ref 11.5–15.5)
WBC: 6.6 K/uL (ref 4.0–10.5)
nRBC: 0 % (ref 0.0–0.2)

## 2024-05-12 LAB — BASIC METABOLIC PANEL WITH GFR
Anion gap: 8 (ref 5–15)
BUN: 49 mg/dL — ABNORMAL HIGH (ref 8–23)
CO2: 29 mmol/L (ref 22–32)
Calcium: 8.5 mg/dL — ABNORMAL LOW (ref 8.9–10.3)
Chloride: 104 mmol/L (ref 98–111)
Creatinine, Ser: 1.86 mg/dL — ABNORMAL HIGH (ref 0.44–1.00)
GFR, Estimated: 24 mL/min — ABNORMAL LOW
Glucose, Bld: 156 mg/dL — ABNORMAL HIGH (ref 70–99)
Potassium: 4.6 mmol/L (ref 3.5–5.1)
Sodium: 141 mmol/L (ref 135–145)

## 2024-05-12 MED ORDER — ENOXAPARIN SODIUM 30 MG/0.3ML IJ SOSY
30.0000 mg | PREFILLED_SYRINGE | INTRAMUSCULAR | Status: DC
  Administered 2024-05-12: 30 mg via SUBCUTANEOUS
  Filled 2024-05-12: qty 0.3

## 2024-05-12 MED ORDER — CHLORHEXIDINE GLUCONATE CLOTH 2 % EX PADS
6.0000 | MEDICATED_PAD | Freq: Every day | CUTANEOUS | Status: DC
  Administered 2024-05-12 – 2024-05-17 (×3): 6 via TOPICAL

## 2024-05-12 MED ORDER — ACETAMINOPHEN 10 MG/ML IV SOLN
1000.0000 mg | Freq: Once | INTRAVENOUS | Status: AC
  Administered 2024-05-12: 1000 mg via INTRAVENOUS
  Filled 2024-05-12: qty 100

## 2024-05-12 MED ORDER — LACTULOSE 10 GM/15ML PO SOLN
30.0000 g | Freq: Once | ORAL | Status: AC
  Administered 2024-05-13: 30 g via ORAL
  Filled 2024-05-12 (×2): qty 60

## 2024-05-12 MED ORDER — POLYETHYLENE GLYCOL 3350 17 G PO PACK
34.0000 g | PACK | Freq: Every day | ORAL | Status: DC
  Administered 2024-05-13 – 2024-05-17 (×5): 34 g via ORAL
  Filled 2024-05-12 (×6): qty 2

## 2024-05-12 MED ORDER — POLYETHYLENE GLYCOL 3350 17 G PO PACK: 34.0000 g | PACK | Freq: Every day | ORAL | Status: DC

## 2024-05-12 MED ORDER — SODIUM CHLORIDE 0.9 % IV SOLN: INTRAVENOUS | Status: DC

## 2024-05-12 NOTE — Progress Notes (Signed)
 Nutrition Follow-up  DOCUMENTATION CODES:   Severe malnutrition in context of chronic illness  INTERVENTION:   -Continue dysphagia 3 diet -Continue Glucerna Shake po TID, each supplement provides 220 kcal and 10 grams of protein  -Continue Magic cup TID with meals, each supplement provides 290 kcal and 9 grams of protein  -Continue MVI with minerals daily -Encourage adequate oral intake; allow outside food if desired   NUTRITION DIAGNOSIS:   Severe Malnutrition related to chronic illness (CHF) as evidenced by severe fat depletion, severe muscle depletion.  Ongoing  GOAL:   Patient will meet greater than or equal to 90% of their needs  Progressing   MONITOR:   PO intake, Supplement acceptance  REASON FOR ASSESSMENT:   Consult Hip fracture protocol  ASSESSMENT:   89 y/o female with h/o CHF, HTN, CAD, MI, Afib, asthma, ischemic cardiomyopathy, parathyroid adenoma and CKD III who is admitted with L hip fracture after a fall.  12/30- s/p Intramedullary nailing of left femur with cephalomedullary device, advanced to dysphagia 3 diet  Reviewed I/O's: +600 ml x 24 hours  UOP: 450 ml x 24 hours  Spoke with patient and son at bedside. Patient deferred most of the history to son. Per son, patient is woozy due to pain medications. Patient has not been eating much- noted lunch tray at bedside which was untouched. Son reports patient has been distracted and sleepy today secondary to medications.   PTA patient has never been a big eater. Patient generally consumes 3 meals per day (Breakfast: biscuit with sausage or jelly and coffee; Lunch: candy; Dinner: 1/4 of meat, starch, and vegetable). Per son, patient loves sweets and will sometimes snack on candy. Patient often consumes minced foods secondary to ease of intake.   Patient denies any weight loss. Per son, patient has always been small framed and petite. Over the past 6 months, weight has ranged from 38.4-43.5 kg. Noted  patient with history of CHF and suspect fluctuations in weight related to fluid status.   Discussed importance of good meal and supplement intake to promote healing. Reviewed plan of care; patient and son amenable to to care plan. Also gave permission to bring outside food if desired. Patient's granddaughter is going to bring strawberry Boost, which patient prefers.   Medications reviewed and include vitamin D3 and senokot.   Labs reviewed.    NUTRITION - FOCUSED PHYSICAL EXAM:  Flowsheet Row Most Recent Value  Orbital Region Severe depletion  Upper Arm Region Severe depletion  Thoracic and Lumbar Region Severe depletion  Buccal Region Severe depletion  Temple Region Severe depletion  Clavicle Bone Region Severe depletion  Clavicle and Acromion Bone Region Severe depletion  Scapular Bone Region Severe depletion  Dorsal Hand Severe depletion  Patellar Region Severe depletion  Anterior Thigh Region Severe depletion  Posterior Calf Region Severe depletion  Edema (RD Assessment) None  Hair Reviewed  Eyes Reviewed  Mouth Reviewed  Skin Reviewed  Nails Reviewed    Diet Order:   Diet Order             DIET DYS 3 Room service appropriate? Yes; Fluid consistency: Thin  Diet effective now                   EDUCATION NEEDS:   Education needs have been addressed  Skin:  Skin Assessment: Skin Integrity Issues: Skin Integrity Issues:: Incisions Incisions: closed left hip  Last BM:  05/07/24  Height:   Ht Readings from Last 1 Encounters:  05/11/24 4' 11 (1.499 m)    Weight:   Wt Readings from Last 1 Encounters:  05/12/24 43.5 kg    Ideal Body Weight:  44.5 kg  BMI:  Body mass index is 19.37 kg/m.  Estimated Nutritional Needs:   Kcal:  1100-1300kcal/day  Protein:  55-65g/day  Fluid:  1.1-1.3L/day    Margery ORN, RD, LDN, CDCES Registered Dietitian III Certified Diabetes Care and Education Specialist If unable to reach this RD, please use RD  Inpatient group chat on secure chat between hours of 8am-4 pm daily

## 2024-05-12 NOTE — Progress Notes (Signed)
 " [facility] CLINIC CARDIOLOGY PROGRESS NOTE       Patient ID: Anna Barrett MRN: [id number] DOB/AGE: 1922-05-17 89 y.o.  Admit date: 05/10/2024 Referring Physician Dr. Caleb Exon Primary Physician Lenon Layman ORN, MD  Primary Cardiologist None Reason for Consultation POC  HPI: MAURINA FAWAZ is a 89 y.o. female  with a past medical history of chronic HFrEF, paroxysmal A-fib not on anticoagulation, hypertension, chronic kidney disease who presented to the ED on 05/10/2024 for fall and left hip pain.  Found to have fracture and orthopedics recommended surgery.  Cardiology was consulted for further evaluation.   Interval history: -Patient seen and examined this AM, resting in bed with son at bedside.  -No complaints during my visit. Son states she has not complained of any chest pain, SOB.  -BP and HR remain stable.   Review of systems complete and found to be negative unless listed above    Past Medical History:  Diagnosis Date   Asthma    Carotid stenosis    Headache    MIGRAINES   History of MI (myocardial infarction)    Hyperglycemia    Hyperlipidemia    Hypertension    Ischemic cardiomyopathy    Myocardial infarction [facility]) 1962   Osteoporosis    Parathyroid adenoma     Past Surgical History:  Procedure Laterality Date   ABDOMINAL HYSTERECTOMY     APPENDECTOMY     CATARACT EXTRACTION Bilateral    ESOPHAGOGASTRODUODENOSCOPY (EGD) WITH PROPOFOL  N/A 04/10/2020   Procedure: ESOPHAGOGASTRODUODENOSCOPY (EGD) WITH PROPOFOL ;  Surgeon: Toledo, Ladell POUR, MD;  Location: [facility] ENDOSCOPY;  Service: Gastroenterology;  Laterality: N/A;   ESOPHAGOGASTRODUODENOSCOPY (EGD) WITH PROPOFOL  N/A 06/06/2021   Procedure: ESOPHAGOGASTRODUODENOSCOPY (EGD) WITH PROPOFOL ;  Surgeon: Therisa Bi, MD;  Location: [facility] ENDOSCOPY;  Service: Gastroenterology;  Laterality: N/A;   ESOPHAGOGASTRODUODENOSCOPY (EGD) WITH PROPOFOL  N/A 06/21/2021   Procedure: ESOPHAGOGASTRODUODENOSCOPY (EGD) WITH PROPOFOL ;   Surgeon: Toledo, Ladell POUR, MD;  Location: [facility] ENDOSCOPY;  Service: Gastroenterology;  Laterality: N/A;   EYE SURGERY     FRACTURE SURGERY     INTRAMEDULLARY (IM) NAIL INTERTROCHANTERIC Left 05/11/2024   Procedure: FIXATION, FRACTURE, INTERTROCHANTERIC, WITH INTRAMEDULLARY ROD;  Surgeon: Ezra Jackquline RAMAN, MD;  Location: [facility] ORS;  Service: Orthopedics;  Laterality: Left;   ORIF ANKLE FRACTURE Left 04/17/2015   Procedure: OPEN REDUCTION INTERNAL FIXATION (ORIF) ANKLE FRACTURE;  Surgeon: Norleen JINNY Maltos, MD;  Location: [facility] ORS;  Service: Orthopedics;  Laterality: Left;    Medications Prior to Admission  Medication Sig Dispense Refill Last Dose/Taking   albuterol  (VENTOLIN  HFA) 108 (90 Base) MCG/ACT inhaler Inhale 2 puffs into the lungs every 4 (four) hours as needed for wheezing.   Unknown   amLODipine  (NORVASC ) 2.5 MG tablet Take 2.5 mg by mouth daily.   05/10/2024 Morning   carvedilol  (COREG ) 3.125 MG tablet Take 3.125 mg by mouth 2 (two) times daily with a meal.   05/10/2024 at  7:00 PM   Cholecalciferol  25 MCG (1000 UT) tablet Take 1,000 Units by mouth 2 (two) times daily.   05/10/2024 at  7:00 PM   feeding supplement (ENSURE ENLIVE / ENSURE PLUS) LIQD Take 237 mLs by mouth 3 (three) times daily between meals. (Patient taking differently: Take 237 mLs by mouth 2 (two) times daily between meals.) 237 mL 12 Taking Differently   nitroGLYCERIN  (NITROSTAT ) 0.4 MG SL tablet Place 0.4 mg under the tongue every 5 (five) minutes as needed for chest pain.   Unknown   omeprazole (PRILOSEC) 10 MG  capsule Take 10 mg by mouth daily.   05/10/2024 at  8:00 AM   ondansetron  (ZOFRAN -ODT) 4 MG disintegrating tablet Allow 1-2 tablets to dissolve in your mouth every 8 hours as needed for nausea/vomiting 30 tablet 0 Unknown   potassium chloride  (KLOR-CON ) 10 MEQ tablet Take 10 mEq by mouth at bedtime.   05/09/2024 at  7:00 PM   senna (SENOKOT) 8.6 MG TABS tablet Take 1 tablet by mouth 2 (two) times daily.   05/10/2024  at  7:00 PM   torsemide  (DEMADEX ) 20 MG tablet Take 20 mg by mouth daily.   05/10/2024 at  8:00 AM   WIXELA INHUB 100-50 MCG/ACT AEPB Inhale 1 puff into the lungs at bedtime.   05/09/2024 Bedtime   Social History   Socioeconomic History   Marital status: Widowed    Spouse name: Not on file   Number of children: Not on file   Years of education: Not on file   Highest education level: Not on file  Occupational History   Not on file  Tobacco Use   Smoking status: Never   Smokeless tobacco: Never  Vaping Use   Vaping status: Never Used  Substance and Sexual Activity   Alcohol use: No   Drug use: Never   Sexual activity: Never  Other Topics Concern   Not on file  Social History Narrative   Not on file   Social Drivers of Health   Tobacco Use: Low Risk (05/10/2024)   Patient History    Smoking Tobacco Use: Never    Smokeless Tobacco Use: Never    Passive Exposure: Not on file  Financial Resource Strain: Low Risk  (02/11/2023)   Received from [facility]   Overall Financial Resource Strain (CARDIA)    Difficulty of Paying Living Expenses: Not hard at all  Food Insecurity: No Food Insecurity (05/11/2024)   Epic    Worried About Radiation Protection Practitioner of Food in the Last Year: Never true    Ran Out of Food in the Last Year: Never true  Transportation Needs: No Transportation Needs (05/11/2024)   Epic    Lack of Transportation (Medical): No    Lack of Transportation (Non-Medical): No  Physical Activity: Not on file  Stress: Not on file  Social Connections: Moderately Integrated (05/11/2024)   Social Connection and Isolation Panel    Frequency of Communication with Friends and Family: More than three times a week    Frequency of Social Gatherings with Friends and Family: More than three times a week    Attends Religious Services: More than 4 times per year    Active Member of [employer] or Organizations: Yes    Attends Banker Meetings: More than 4 times per  year    Marital Status: Widowed  Intimate Partner Violence: Not At Risk (05/11/2024)   Epic    Fear of Current or Ex-Partner: No    Emotionally Abused: No    Physically Abused: No    Sexually Abused: No  Depression (PHQ2-9): Not on file  Alcohol Screen: Not on file  Housing: Low Risk (05/11/2024)   Epic    Unable to Pay for Housing in the Last Year: No    Number of Times Moved in the Last Year: 0    Homeless in the Last Year: No  Utilities: Not At Risk (05/11/2024)   Epic    Threatened with loss of utilities: No  Health Literacy: Not on file    Family History  Problem Relation Age of Onset   Heart disease Mother    Heart disease Father      Vitals:   05/11/24 1938 05/12/24 0200 05/12/24 0420 05/12/24 0806  BP: (!) 115/54 (!) 128/56 130/77 (!) 137/43  Pulse: 85 94 91 89  Resp: 18  18 17   Temp: 98.1 F (36.7 C)  98 F (36.7 C) 98.6 F (37 C)  TempSrc: Oral     SpO2: 96% 94% 100% 100%  Weight:   43.5 kg   Height:        PHYSICAL EXAM General: Chronically ill appearing elderly female, well nourished, in no acute distress. HEENT: Normocephalic and atraumatic. Neck: No JVD.  Lungs: Normal respiratory effort on 2L [place]. Clear bilaterally to auscultation. No wheezes, crackles, rhonchi.  Heart: HRRR. Normal S1 and S2 without gallops or murmurs.  Abdomen: Non-distended appearing.  Msk: Normal strength and tone for age. Extremities: Warm and well perfused. No clubbing, cyanosis. No edema.  Neuro: Alert and oriented X 3. Psych: Answers questions appropriately.   Labs: Basic Metabolic Panel: Recent Labs    05/11/24 0829 05/12/24 0605  NA 141 141  K 5.3* 4.6  CL 102 104  CO2 31 29  GLUCOSE 167* 156*  BUN 51* 49*  CREATININE 1.48* 1.86*  CALCIUM 9.2 8.5*   Liver Function Tests: Recent Labs    05/10/24 2237  AST 14*  ALT 17  ALKPHOS 76  BILITOT 0.4  PROT 7.1  ALBUMIN 3.8   No results for input(s): LIPASE, AMYLASE in the last 72 hours. CBC: Recent  Labs    05/10/24 2237 05/11/24 0829 05/12/24 0605  WBC 12.4* 9.6 6.6  NEUTROABS 9.5*  --   --   HGB 12.5 11.4* 8.2*  HCT 38.4 35.8* 24.8*  MCV 93.0 94.5 92.9  PLT 243 217 166   Cardiac Enzymes: No results for input(s): CKTOTAL, CKMB, CKMBINDEX, TROPONINIHS in the last 72 hours. BNP: No results for input(s): BNP in the last 72 hours. D-Dimer: No results for input(s): DDIMER in the last 72 hours. Hemoglobin A1C: No results for input(s): HGBA1C in the last 72 hours. Fasting Lipid Panel: No results for input(s): CHOL, HDL, LDLCALC, TRIG, CHOLHDL, LDLDIRECT in the last 72 hours. Thyroid  Function Tests: No results for input(s): TSH, T4TOTAL, T3FREE, THYROIDAB in the last 72 hours.  Invalid input(s): FREET3 Anemia Panel: No results for input(s): VITAMINB12, FOLATE, FERRITIN, TIBC, IRON, RETICCTPCT in the last 72 hours.   Radiology: DG HIP UNILAT WITH PELVIS 2-3 VIEWS LEFT Result Date: 05/11/2024 CLINICAL DATA:  Elective surgery. EXAM: DG HIP (WITH OR WITHOUT PELVIS) 2-3V LEFT COMPARISON:  Preoperative imaging FINDINGS: Five fluoroscopic spot views of the left hip and femur submitted from the operating room. Femoral intramedullary nail with trans trochanteric and distal locking screw fixation traverse comminuted proximal femur fracture. Fluoroscopy time 5 minutes 55 seconds. Dose 40.68 mGy IMPRESSION: Intraoperative fluoroscopy during left femur fracture ORIF. Electronically Signed   By: Andrea Gasman M.D.   On: 05/11/2024 17:12   DG C-Arm 1-60 Min-No Report Result Date: 05/11/2024 Fluoroscopy was utilized by the requesting physician.  No radiographic interpretation.   DG C-Arm 1-60 Min-No Report Result Date: 05/11/2024 Fluoroscopy was utilized by the requesting physician.  No radiographic interpretation.   DG C-Arm 1-60 Min-No Report Result Date: 05/11/2024 Fluoroscopy was utilized by the requesting physician.  No radiographic  interpretation.   DG Chest Port 1 View Result Date: 05/11/2024 EXAM: 1 VIEW(S) XRAY OF THE CHEST 05/10/2024 10:54:57 PM COMPARISON:  10/28/2023 CLINICAL HISTORY: chest pain after fall FINDINGS: LUNGS AND PLEURA: COPD with hyperinflation. No focal pulmonary opacity. No pleural effusion. No pneumothorax. HEART AND MEDIASTINUM: Aortic arch calcifications. No acute abnormality of the cardiac and mediastinal silhouettes. BONES AND SOFT TISSUES: Old healed right humeral neck fracture. IMPRESSION: 1. No acute findings. Electronically signed by: Franky Stanford MD 05/11/2024 01:46 AM EST RP Workstation: [id number]   DG Hip Unilat W or Wo Pelvis 2-3 Views Left Result Date: 05/11/2024 EXAM: 2 OR MORE VIEW(S) XRAY OF THE PELVIS AND LEFT HIP 05/10/2024 10:54:57 PM COMPARISON: None available. CLINICAL HISTORY: fall, w pain and shortening/external rotation FINDINGS: BONES AND JOINTS: SI joints are symmetric. Partially visualized intramedullary nail fixation of the right hip in place. Acute comminuted intertrochanteric fracture of the left hip with varus angulation. SOFT TISSUES: Vascular calcifications. IMPRESSION: 1. Acute comminuted intertrochanteric fracture of the left hip with varus angulation. 2. Partially visualized intramedullary nail fixation of the right hip in place. Electronically signed by: Franky Stanford MD 05/11/2024 01:45 AM EST RP Workstation: [id number]   Echo 06/2022 [facility]:   1. Technically very difficult study.    2. The left ventricle is normal in size with normal wall thickness.    3. The left ventricular systolic function is severely decreased, LVEF is  visually estimated at 30-35%.    4. There is anterior, septal, and apical hypokinesis. This pattern can be  seen in stress-induced cardiomyopathy however clinical correlation required.    5. Mitral annular calcification is present (moderate).    6. There is mild aortic regurgitation.    7. The right ventricle is normal in size, with mildly reduced  systolic  function.   ECHO 10/2023: 1. Left ventricular ejection fraction, by estimation, is 35 to 40%. The  left ventricle has moderately decreased function. The left ventricle  demonstrates global hypokinesis. There is moderate concentric left  ventricular hypertrophy. Left ventricular  diastolic parameters are consistent with Grade I diastolic dysfunction  (impaired relaxation).   2. Right ventricular systolic function is normal. The right ventricular  size is normal.   3. The mitral valve is myxomatous. Mild mitral valve regurgitation.   4. The aortic valve is grossly normal. Aortic valve regurgitation is  mild. Aortic valve sclerosis/calcification is present, without any  evidence of aortic stenosis.   LHC 2013 DU[facility]: DIAGNOSTIC SUMMARY   Coronary Artery Disease                    RCA system:  normal       Left Main:  normal                     No significant CAD indicated       LAD system:  normal                Left Ventriculogram       LCX system:  normal                    Ejection Fraction:   72%   TELEMETRY (personally reviewed): not on tele  EKG (personally reviewed): normal sinus rhythm chronic left bundle branch block rate 78 bpm  Data reviewed by me 05/12/2024: last 24h vitals tele labs imaging I/O ED provider note, admission H&P  Principal Problem:   Closed left hip fracture (HCC) Active Problems:   Hypertension   Asthma, chronic   CAD (coronary artery disease)   Myocardial injury   Chronic kidney disease, stage  3a (HCC)   Atrial fibrillation, chronic (HCC)   Protein-calorie malnutrition, severe   Fall at home, initial encounter   Chronic combined systolic and diastolic CHF (congestive heart failure) (HCC)    ASSESSMENT AND PLAN:  CARSYN BOSTER is a 89 y.o. female  with a past medical history of chronic HFrEF, paroxysmal A-fib not on anticoagulation, hypertension, chronic kidney disease who presented to the ED on 05/10/2024 for fall and left hip pain.  Found  to have fracture and orthopedics recommended surgery.  Cardiology was consulted for further evaluation.   # Preoperative cardiac evaluation # Chronic HFrEF # Paroxysmal atrial fibrillation # Hypertension # Chronic kidney disease Patient presented after fall at home. Has L hip fx, ortho planning for surgery today. No recent exertional angina or SOB. EKG without acute ischemic changes.  -No cardiac issues post-operatively. No further recommendations from cardiac perspective.  -Minimal and flat troponin most consistent with demand/supply mismatch and not ACS.  Cardiology will sign off. Please haiku with questions or re-engage if needed. Follow up with Tinnie Maiden, NP in 1 month.   This patient's plan of care was discussed and created with Dr. Wilburn and he is in agreement.  Signed: Danita Bloch, PA-C  05/12/2024, 11:14 AM [facility] Cardiology      "

## 2024-05-12 NOTE — Progress Notes (Signed)
 PHARMACIST - PHYSICIAN COMMUNICATION  CONCERNING:  Enoxaparin  (Lovenox ) for DVT Prophylaxis    RECOMMENDATION: Patient was prescribed enoxaprin 40mg  q24 hours for VTE prophylaxis.   Filed Weights   05/11/24 0102 05/12/24 0420  Weight: 44.1 kg (97 lb 3.6 oz) 43.5 kg (95 lb 14.4 oz)    Body mass index is 19.37 kg/m.  Estimated Creatinine Clearance: 11.2 mL/min (A) (by C-G formula based on SCr of 1.86 mg/dL (H)).  Patient is candidate for enoxaparin  30mg  every 24 hours based on CrCl <50ml/min or Weight <45kg  DESCRIPTION: Pharmacy has adjusted enoxaparin  dose per [facility] policy.  Patient is now receiving enoxaparin  30 mg every 24 hours    Lum VEAR Mania, PharmD Clinical Pharmacist  05/12/2024 8:23 AM

## 2024-05-12 NOTE — Plan of Care (Signed)

## 2024-05-12 NOTE — Progress Notes (Signed)
 " PROGRESS NOTE    Anna Barrett  FMW:[id number] DOB: 1923/05/09 DOA: 05/10/2024 PCP: Lenon Layman ORN, MD  Chief Complaint  Patient presents with   [place] Course:  Anna Barrett is a 89 y.o. female with medical history significant of  sCHF with EF 35-40%, HTN,  HLD, CAD, MI, asthma, GERD, CKD-3A, A-fib not on anticoagulants, anemia, who presents with mechanical fall and left hip pain.    Per his son and granddaughter at the bedside, patient fell accidentally when she was walking at home round 10:00 p.m.  No LOC.  Her granddaughter is very sure that the patient did not have head or neck injury.  Family does not want to do CT scan of the head and neck.  Patient developed left hip pain, which is constant, severe, sharp, nonradiating, aggravated by movement.  Left leg is shortened and externally rotated.  No chest pain, cough, SOB.  Patient has nausea, no vomiting, diarrhea or abdominal pain.  No symptoms of UTI.  Workup revealed intertrochanteric fracture of the left hip, hospital course as below  Subjective:  Sedated. Mod left hip pain. No bm. Minimal po today   Objective: Vitals:   05/12/24 0200 05/12/24 0420 05/12/24 0806 05/12/24 1515  BP: (!) 128/56 130/77 (!) 137/43 132/72  Pulse: 94 91 89 81  Resp:  18 17 15   Temp:  98 F (36.7 C) 98.6 F (37 C) 98.2 F (36.8 C)  TempSrc:      SpO2: 94% 100% 100% 97%  Weight:  43.5 kg    Height:        Intake/Output Summary (Last 24 hours) at 05/12/2024 1657 Last data filed at 05/12/2024 1500 Gross per 24 hour  Intake 250 ml  Output --  Net 250 ml   Filed Weights   05/11/24 0102 05/12/24 0420  Weight: 44.1 kg 43.5 kg    Examination: General: Not in acute distress Cardiac: S1/S2, RRR, No murmurs,  Respiratory: No rales, wheezing, rhonchi or rubs GI: Soft, nondistended, nontender  Ext: No pitting leg edema bilaterally.   Musculoskeletal: not assessed Skin: No rashes. Dressing left lateral hip c/d/i Neuro:  Drowsy    Assessment & Plan:  Closed left hip fracture (HCC) X-ray showed intertrochanteric fracture of the left hip S/p left hip cephalomedullary nailing 12/30 Pain management with Tylenol , low-dose opioids PT/OT advising snf, toc engaged  Constipation Son reports no bm one week. Only on senna here - add miralax/lactulose  Mild hyperkalemia Lokelma  1 dose, resolved  Acute blood loss anemia Hgb 8.2 today, will plan to transfuse if drops below 8  Chronic kidney disease, stage 3a (HCC):  Baseline cr 1.1-1.2, slightly higher than normal ~1.4 Hold torsemide  Start gentle fluids until taking good po   Chronic combined systolic and diastolic CHF (congestive heart failure) (HCC) 2D echo on 10/30/2023 showed EF of 35-40% with grade 1 diastolic dysfunction. CHF seems to be compensated. Home torsemide  on hold due to being NPO, mildly elevated Cr   CAD (coronary artery disease) and myocardial injury: Troponin 27, no chest pain.  Likely demand ischemia. Patient stopped taking aspirin  Continue Coreg  Trend troponin   Hypertension IV hydralazine  as needed Continue Coreg  Home amlodipine  on hold   Asthma, chronic: Stable Bronchodilators, and prn Mucinex   Atrial fibrillation, chronic (HCC): Patient is not taking anticoagulants. -Continue Coreg  3 125 mg twice daily   Protein-calorie malnutrition, severe: Body weight 38.4 kg, BMI 19.6 - Ensure - Nutrition consult   DVT prophylaxis: lovenox   Code Status: Limited: Do not attempt resuscitation (DNR) -DNR-LIMITED -Do Not Intubate/DNI  Disposition: snf  Consultants:  Treatment Team:  Consulting Physician: Ezra Jackquline RAMAN, MD Cardiology  Procedures:  S/p left hip cephalomedullary nailing 12/30  Antimicrobials:  Anti-infectives (From admission, onward)    Start     Dose/Rate Route Frequency Ordered Stop   05/11/24 1400  ceFAZolin  (ANCEF ) IVPB 2g/100 mL premix        2 g 200 mL/hr over 30 Minutes Intravenous Every 8 hours  05/11/24 0937 05/12/24 0604   05/10/24 2330  azithromycin  (ZITHROMAX ) 500 mg in sodium chloride  0.9 % 250 mL IVPB  Status:  Discontinued        500 mg 250 mL/hr over 60 Minutes Intravenous  Once 05/10/24 2316 05/10/24 2316   05/10/24 2330  cefTRIAXone  (ROCEPHIN ) 1 g in sodium chloride  0.9 % 100 mL IVPB  Status:  Discontinued        1 g 200 mL/hr over 30 Minutes Intravenous  Once 05/10/24 2316 05/10/24 2316       Data Reviewed: I have personally reviewed following labs and imaging studies CBC: Recent Labs  Lab 05/10/24 2237 05/11/24 0829 05/12/24 0605  WBC 12.4* 9.6 6.6  NEUTROABS 9.5*  --   --   HGB 12.5 11.4* 8.2*  HCT 38.4 35.8* 24.8*  MCV 93.0 94.5 92.9  PLT 243 217 166   Basic Metabolic Panel: Recent Labs  Lab 05/10/24 2237 05/11/24 0829 05/12/24 0605  NA 140 141 141  K 4.0 5.3* 4.6  CL 102 102 104  CO2 28 31 29   GLUCOSE 189* 167* 156*  BUN 49* 51* 49*  CREATININE 1.34* 1.48* 1.86*  CALCIUM 8.7* 9.2 8.5*   GFR: Estimated Creatinine Clearance: 11.2 mL/min (A) (by C-G formula based on SCr of 1.86 mg/dL (H)). Liver Function Tests: Recent Labs  Lab 05/10/24 2237  AST 14*  ALT 17  ALKPHOS 76  BILITOT 0.4  PROT 7.1  ALBUMIN 3.8   CBG: No results for input(s): GLUCAP in the last 168 hours.  No results found for this or any previous visit (from the past 240 hours).   Radiology Studies: DG HIP UNILAT WITH PELVIS 2-3 VIEWS LEFT Result Date: 05/11/2024 CLINICAL DATA:  Elective surgery. EXAM: DG HIP (WITH OR WITHOUT PELVIS) 2-3V LEFT COMPARISON:  Preoperative imaging FINDINGS: Five fluoroscopic spot views of the left hip and femur submitted from the operating room. Femoral intramedullary nail with trans trochanteric and distal locking screw fixation traverse comminuted proximal femur fracture. Fluoroscopy time 5 minutes 55 seconds. Dose 40.68 mGy IMPRESSION: Intraoperative fluoroscopy during left femur fracture ORIF. Electronically Signed   By: Andrea Gasman  M.D.   On: 05/11/2024 17:12   DG C-Arm 1-60 Min-No Report Result Date: 05/11/2024 Fluoroscopy was utilized by the requesting physician.  No radiographic interpretation.   DG C-Arm 1-60 Min-No Report Result Date: 05/11/2024 Fluoroscopy was utilized by the requesting physician.  No radiographic interpretation.   DG C-Arm 1-60 Min-No Report Result Date: 05/11/2024 Fluoroscopy was utilized by the requesting physician.  No radiographic interpretation.   DG Chest Port 1 View Result Date: 05/11/2024 EXAM: 1 VIEW(S) XRAY OF THE CHEST 05/10/2024 10:54:57 PM COMPARISON: 10/28/2023 CLINICAL HISTORY: chest pain after fall FINDINGS: LUNGS AND PLEURA: COPD with hyperinflation. No focal pulmonary opacity. No pleural effusion. No pneumothorax. HEART AND MEDIASTINUM: Aortic arch calcifications. No acute abnormality of the cardiac and mediastinal silhouettes. BONES AND SOFT TISSUES: Old healed right humeral neck fracture. IMPRESSION: 1. No acute findings.  Electronically signed by: Franky Stanford MD 05/11/2024 01:46 AM EST RP Workstation: [id number]   DG Hip Unilat W or Wo Pelvis 2-3 Views Left Result Date: 05/11/2024 EXAM: 2 OR MORE VIEW(S) XRAY OF THE PELVIS AND LEFT HIP 05/10/2024 10:54:57 PM COMPARISON: None available. CLINICAL HISTORY: fall, w pain and shortening/external rotation FINDINGS: BONES AND JOINTS: SI joints are symmetric. Partially visualized intramedullary nail fixation of the right hip in place. Acute comminuted intertrochanteric fracture of the left hip with varus angulation. SOFT TISSUES: Vascular calcifications. IMPRESSION: 1. Acute comminuted intertrochanteric fracture of the left hip with varus angulation. 2. Partially visualized intramedullary nail fixation of the right hip in place. Electronically signed by: Franky Stanford MD 05/11/2024 01:45 AM EST RP Workstation: [id number]    Scheduled Meds:  acetaminophen   650 mg Oral TID   amLODipine   2.5 mg Oral Daily   carvedilol   3.125 mg Oral BID  WC   Chlorhexidine Gluconate Cloth  6 each Topical Daily   cholecalciferol   1,000 Units Oral BID   enoxaparin  (LOVENOX ) injection  30 mg Subcutaneous Q24H   feeding supplement (GLUCERNA SHAKE)  237 mL Oral TID BM   fluticasone  furoate-vilanterol  1 puff Inhalation Daily   lidocaine   1 patch Transdermal Q24H   multivitamin with minerals  1 tablet Oral Daily   pantoprazole   40 mg Oral Daily   senna  1 tablet Oral BID   Continuous Infusions:   LOS: 2 days   Devaughn KATHEE Ban, MD [facility]  To contact the attending physician between 7A-7P please use Epic Chat. To contact the covering physician during after hours 7P-7A, please review Amion.  05/12/2024, 4:57 PM      "

## 2024-05-12 NOTE — Progress Notes (Signed)
 Physical Therapy Evaluation Patient Details Name: Anna Barrett MRN: [id number] DOB: 1922/09/11 Today's Date: 05/12/2024  History of Present Illness  Patient is a 89 year old female with mechanical fall and hip pain. S/p intramedullary rod left hip for hip fracture. PMH: sCHF with EF 35-40%, HTN,  HLD, CAD, MI, asthma, GERD, CKD-3A, A-fib not on anticoagulants, anemia.   Clinical Impression  PT evaluation completed. Supportive family at the beside provides the history. Patient is Mod I with rollator at baseline and has 24/7 supervision/assist at home at baseline. She can complete most of her ADLs without assistance typically.  Today the patient is lethargic but is alert with stimulation. She has pain in the left hip with movement. She required Total assistance + 2 person for bed mobility. Sitting balance is poor with posterior and right lean for off loading the left hip for comfort. Unable to progress standing today. The patient is far from her baseline level of functional independence. Recommend rehabilitation < 3 hours/day after this hospital stay. Family is in agreement. PT will continue to follow.       If plan is discharge home, recommend the following: Two people to help with walking and/or transfers;Two people to help with bathing/dressing/bathroom;Assist for transportation;Help with stairs or ramp for entrance;Assistance with cooking/housework   Can travel by private vehicle   No    Equipment Recommendations  (to be determined at next level of care)  Recommendations for Other Services       Functional Status Assessment Patient has had a recent decline in their functional status and demonstrates the ability to make significant improvements in function in a reasonable and predictable amount of time.     Precautions / Restrictions Precautions Precautions: Fall Recall of Precautions/Restrictions: Impaired Restrictions Weight Bearing Restrictions Per Provider Order: No       Mobility  Bed Mobility Overal bed mobility: Needs Assistance Bed Mobility: Supine to Sit, Sit to Supine     Supine to sit: Total assist, +2 for physical assistance Sit to supine: Total assist, +2 for physical assistance   General bed mobility comments: significant assistance for bed mobility    Transfers                   General transfer comment: unable to safely attempt standing due to poor standing tolerance    Ambulation/Gait                  Stairs            Wheelchair Mobility     Tilt Bed    Modified Rankin (Stroke Patients Only)       Balance Overall balance assessment: Needs assistance Sitting-balance support: Feet supported Sitting balance-Leahy Scale: Poor Sitting balance - Comments: external support required for posterior lean in sitting. patient also intermittent leaning to the right for off loading the right hip. eyes closed intermittently Postural control: Posterior lean, Right lateral lean                                   Pertinent Vitals/Pain Pain Assessment Pain Assessment: Faces Faces Pain Scale: Hurts whole lot Pain Location: L hip with movement Pain Descriptors / Indicators: Discomfort, Guarding, Grimacing Pain Intervention(s): Limited activity within patient's tolerance, Monitored during session, Repositioned, Ice applied    Home Living Family/patient expects to be discharged to:: Skilled nursing facility Living Arrangements: Children;Other (Comment) (family rotates for 24/7 care)  Available Help at Discharge: Family Type of Home: House           Home Equipment: Rollator (4 wheels);Shower seat;Grab bars - toilet;Grab bars - tub/shower Additional Comments: family provide rotating 24/7 care for pt at home    Prior Function Prior Level of Function : Needs assist;History of Falls (last six months)             Mobility Comments: amb with AD ADLs Comments: family provides assist for IADL,  bathing     Extremity/Trunk Assessment   Upper Extremity Assessment Upper Extremity Assessment: Generalized weakness    Lower Extremity Assessment Lower Extremity Assessment: Generalized weakness;LLE deficits/detail LLE Deficits / Details: pain with movement of him. minimal active movement for formal assessment LLE: Unable to fully assess due to pain       Communication   Communication Communication: Impaired Factors Affecting Communication: Hearing impaired    Cognition Arousal: Lethargic Behavior During Therapy: Flat affect   PT - Cognitive impairments: Difficult to assess Difficult to assess due to: Impaired communication, Level of arousal                     PT - Cognition Comments: patient is drowsy during session. multi modal cues for command following Following commands: Impaired Following commands impaired: Follows one step commands with increased time     Cueing Cueing Techniques: Verbal cues, Tactile cues     General Comments General comments (skin integrity, edema, etc.): family is very supportive and is requesting short term rehab at discharge    Exercises     Assessment/Plan    PT Assessment Patient needs continued PT services  PT Problem List Decreased strength;Decreased range of motion;Decreased activity tolerance;Decreased balance;Decreased mobility;Decreased cognition;Decreased safety awareness;Decreased knowledge of use of DME;Decreased knowledge of precautions;Pain       PT Treatment Interventions DME instruction;Gait training;Stair training;Functional mobility training;Therapeutic activities;Therapeutic exercise;Balance training;Neuromuscular re-education;Cognitive remediation;Patient/family education;Wheelchair mobility training    PT Goals (Current goals can be found in the Care Plan section)  Acute Rehab PT Goals Patient Stated Goal: family goal is SNF PT Goal Formulation: With family Time For Goal Achievement: 05/26/24 Potential  to Achieve Goals: Fair    Frequency 7X/week     Co-evaluation PT/OT/SLP Co-Evaluation/Treatment: Yes Reason for Co-Treatment: To address functional/ADL transfers PT goals addressed during session: Mobility/safety with mobility OT goals addressed during session: ADL's and self-care       AM-PAC PT 6 Clicks Mobility  Outcome Measure Help needed turning from your back to your side while in a flat bed without using bedrails?: Total Help needed moving from lying on your back to sitting on the side of a flat bed without using bedrails?: Total Help needed moving to and from a bed to a chair (including a wheelchair)?: Total Help needed standing up from a chair using your arms (e.g., wheelchair or bedside chair)?: Total Help needed to walk in hospital room?: Total Help needed climbing 3-5 steps with a railing? : Total 6 Click Score: 6    End of Session   Activity Tolerance: Patient tolerated treatment well Patient left: in bed;with call bell/phone within reach;with bed alarm set (ice pack L hip) Nurse Communication: Mobility status PT Visit Diagnosis: Difficulty in walking, not elsewhere classified (R26.2);Pain Pain - Right/Left: Left Pain - part of body: Hip    Time: 9090-9055 PT Time Calculation (min) (ACUTE ONLY): 35 min   Charges:   PT Evaluation $PT Eval Moderate Complexity: 1 Mod PT Treatments $Therapeutic Activity:  8-22 mins PT General Charges $$ ACUTE PT VISIT: 1 Visit         Randine Essex, PT, MPT   Randine LULLA Essex 05/12/2024, 11:10 AM

## 2024-05-12 NOTE — Progress Notes (Signed)
" ° °  Subjective: 1 Day Post-Op Procedures (LRB): FIXATION, FRACTURE, INTERTROCHANTERIC, WITH INTRAMEDULLARY ROD (Left) Patient reports pain as mild.   Patient is well, and has had no acute complaints or problems Denies any CP, SOB, ABD pain. We will continue therapy today.  Plan is to go Skilled nursing facility after hospital stay.  Objective: Vital signs in last 24 hours: Temp:  [97.5 F (36.4 C)-98.6 F (37 C)] 98.6 F (37 C) (12/31 0806) Pulse Rate:  [73-98] 89 (12/31 0806) Resp:  [10-20] 17 (12/31 0806) BP: (68-167)/(43-112) 137/43 (12/31 0806) SpO2:  [90 %-100 %] 100 % (12/31 0806) Weight:  [43.5 kg] 43.5 kg (12/31 0420)  Intake/Output from previous day: 12/30 0701 - 12/31 0700 In: 1100 [I.V.:1000; IV Piggyback:100] Out: 500 [Urine:450; Blood:50] Intake/Output this shift: No intake/output data recorded.  Recent Labs    05/10/24 2237 05/11/24 0829 05/12/24 0605  HGB 12.5 11.4* 8.2*   Recent Labs    05/11/24 0829 05/12/24 0605  WBC 9.6 6.6  RBC 3.79* 2.67*  HCT 35.8* 24.8*  PLT 217 166   Recent Labs    05/11/24 0829 05/12/24 0605  NA 141 141  K 5.3* 4.6  CL 102 104  CO2 31 29  BUN 51* 49*  CREATININE 1.48* 1.86*  GLUCOSE 167* 156*  CALCIUM 9.2 8.5*   Recent Labs    05/11/24 0129  INR 1.1    EXAM General - Patient is Alert, Appropriate, and Oriented Extremity - Neurovascular intact Sensation intact distally Intact pulses distally Dorsiflexion/Plantar flexion intact Dressing - dressing C/D/I and no drainage Motor Function - intact, moving foot and toes well on exam.   Past Medical History:  Diagnosis Date   Asthma    Carotid stenosis    Headache    MIGRAINES   History of MI (myocardial infarction)    Hyperglycemia    Hyperlipidemia    Hypertension    Ischemic cardiomyopathy    Myocardial infarction (HCC) 1962   Osteoporosis    Parathyroid adenoma     Assessment/Plan:   1 Day Post-Op Procedures (LRB): FIXATION, FRACTURE,  INTERTROCHANTERIC, WITH INTRAMEDULLARY ROD (Left) Principal Problem:   Closed left hip fracture (HCC) Active Problems:   Hypertension   Asthma, chronic   CAD (coronary artery disease)   Myocardial injury   Chronic kidney disease, stage 3a (HCC)   Atrial fibrillation, chronic (HCC)   Protein-calorie malnutrition, severe   Fall at home, initial encounter   Chronic combined systolic and diastolic CHF (congestive heart failure) (HCC)  Estimated body mass index is 19.37 kg/m as calculated from the following:   Height as of this encounter: 4' 11 (1.499 m).   Weight as of this encounter: 43.5 kg. Advance diet Up with therapy Pain well controlled VSS Hgb 8.2, start Fe supplement. Recheck Hgb in the am. Consider transfusion if Hgb continues to drop CM to assist with discharge to SNF  Follow up with [facility] in 2 weeeks  DVT Prophylaxis - Lovenox , TED hose, and SCDs Weight-Bearing as tolerated to left leg   T. Medford Amber, PA-C [facility] 05/12/2024, 8:29 AM   "

## 2024-05-12 NOTE — Evaluation (Signed)
 Occupational Therapy Evaluation Patient Details Name: Anna Barrett MRN: [id number] DOB: Dec 26, 1922 Today's Date: 05/12/2024   History of Present Illness   Patient is a 89 year old female with mechanical fall and hip pain. S/p intramedullary rod left hip for hip fracture. PMH: sCHF with EF 35-40%, HTN,  HLD, CAD, MI, asthma, GERD, CKD-3A, A-fib not on anticoagulants, anemia.     Clinical Impressions Pt was seen for OT evaluation this date. Prior to hospital admission, pt was living with family providing rotating 24/7 care, using AD for mobility, and had assist for IADL and minimal ADL. Pt a bit lethargic, wakes to name but demo'd difficulty maintaining alertness to answer questions and follow simple commands. Pt presents with deficits in strength, LLE pain, activity tolerance, and balance limiting her ability to perform ADL management at baseline level. Pt currently requires TOTAL A for LB ADL, toileting, MAX A for UB ADL, and MAX A for self feeding/grooming. Attempted hand over hand assist for taking sip of Boost shake from cup with and without straw but unsuccessful. Pt alertness impeding functional independence in addition to pain. Pt required TOTAL A +2 for sup<>sit EOB but did tolerate EOB for ~43min with CGA to MOD A for sitting balance/back support. Pt would benefit from skilled OT services to address noted impairments and functional limitations (see below for any additional details) in order to maximize safety and independence while minimizing future risk of falls, injury, and readmission. Anticipate the need for follow up OT services upon acute hospital DC.    If plan is discharge home, recommend the following:   Two people to help with walking and/or transfers;Two people to help with bathing/dressing/bathroom;Assistance with cooking/housework;Assist for transportation;Help with stairs or ramp for entrance;Assistance with feeding;Direct supervision/assist for medications management;Direct  supervision/assist for financial management;Supervision due to cognitive status     Functional Status Assessment   Patient has had a recent decline in their functional status and demonstrates the ability to make significant improvements in function in a reasonable and predictable amount of time.     Equipment Recommendations   Other (comment) (defer)     Recommendations for Other Services         Precautions/Restrictions   Precautions Precautions: Fall Recall of Precautions/Restrictions: Impaired Restrictions Weight Bearing Restrictions Per Provider Order: Yes LLE Weight Bearing Per Provider Order: Weight bearing as tolerated     Mobility Bed Mobility Overal bed mobility: Needs Assistance Bed Mobility: Supine to Sit, Sit to Supine     Supine to sit: +2 for physical assistance, Total assist Sit to supine: Total assist, +2 for physical assistance        Transfers                   General transfer comment: unsafe to attempt at this time      Balance Overall balance assessment: Needs assistance Sitting-balance support: Feet unsupported, No upper extremity supported, Single extremity supported, Bilateral upper extremity supported Sitting balance-Leahy Scale: Poor Sitting balance - Comments: requiring up to MOD A to maintain, improving intermittently to CGA Postural control: Posterior lean                                 ADL either performed or assessed with clinical judgement   ADL Overall ADL's : Needs assistance/impaired  General ADL Comments: Pt currently requires TOTAL A for LB ADL, toileting, MAX A for UB ADL, and MAX A for self feeding/grooming. Attempted hand over hand assist for taking sip of Boost shake from cup with and without straw but unsuccessful. Pt alertness impeding functional independence in addition to pain.     Vision         Perception         Praxis          Pertinent Vitals/Pain Pain Assessment Pain Assessment: Faces Faces Pain Scale: Hurts whole lot Pain Location: LLE Pain Descriptors / Indicators: Grimacing, Guarding, Moaning Pain Intervention(s): Limited activity within patient's tolerance, Monitored during session, Premedicated before session, Repositioned, Patient requesting pain meds-RN notified, Ice applied     Extremity/Trunk Assessment Upper Extremity Assessment Upper Extremity Assessment: Generalized weakness   Lower Extremity Assessment Lower Extremity Assessment: Generalized weakness;LLE deficits/detail LLE: Unable to fully assess due to pain       Communication Communication Communication: Impaired Factors Affecting Communication: Hearing impaired   Cognition Arousal: Lethargic Behavior During Therapy: Flat affect Cognition: Difficult to assess Difficult to assess due to: Level of arousal                             Following commands: Impaired Following commands impaired: Follows one step commands inconsistently, Follows one step commands with increased time     Cueing  General Comments   Cueing Techniques: Verbal cues;Tactile cues      Exercises Other Exercises Other Exercises: Pt tolerated sitting EOB with support for ~37min   Shoulder Instructions      Home Living Family/patient expects to be discharged to:: Skilled nursing facility Living Arrangements: Children;Other (Comment) (family rotates for 24/7 care) Available Help at Discharge: Family Type of Home: House                       Home Equipment: Rollator (4 wheels);Shower seat;Grab bars - toilet;Grab bars - tub/shower   Additional Comments: family provide rotating 24/7 care for pt at home      Prior Functioning/Environment Prior Level of Function : Needs assist;History of Falls (last six months)             Mobility Comments: amb with AD ADLs Comments: family provides assist for IADL, bathing    OT  Problem List: Decreased strength;Pain;Decreased activity tolerance;Impaired balance (sitting and/or standing);Decreased knowledge of use of DME or AE   OT Treatment/Interventions: Self-care/ADL training;Therapeutic exercise;Therapeutic activities;DME and/or AE instruction;Patient/family education;Balance training      OT Goals(Current goals can be found in the care plan section)   Acute Rehab OT Goals Patient Stated Goal: get better and walk again OT Goal Formulation: With patient/family Time For Goal Achievement: 05/26/24 Potential to Achieve Goals: Good ADL Goals Pt Will Perform Eating: with modified independence;sitting (supported sitting EOB) Pt Will Perform Grooming: sitting;with set-up;with supervision Pt Will Transfer to Toilet: with max assist;stand pivot transfer;bedside commode (LRAD)   OT Frequency:  Min 2X/week    Co-evaluation PT/OT/SLP Co-Evaluation/Treatment: Yes Reason for Co-Treatment: To address functional/ADL transfers PT goals addressed during session: Mobility/safety with mobility OT goals addressed during session: ADL's and self-care      AM-PAC OT 6 Clicks Daily Activity     Outcome Measure Help from another person eating meals?: A Lot Help from another person taking care of personal grooming?: A Lot Help from another person toileting, which includes using toliet, bedpan, or  urinal?: Total Help from another person bathing (including washing, rinsing, drying)?: A Lot Help from another person to put on and taking off regular upper body clothing?: A Lot Help from another person to put on and taking off regular lower body clothing?: A Lot 6 Click Score: 11   End of Session Nurse Communication: Mobility status  Activity Tolerance: Patient limited by pain;Patient limited by lethargy Patient left: in bed;with call bell/phone within reach;with bed alarm set;with family/visitor present  OT Visit Diagnosis: Other abnormalities of gait and mobility  (R26.89);Muscle weakness (generalized) (M62.81);History of falling (Z91.81);Pain Pain - Right/Left: Left Pain - part of body: Hip;Leg                Time: 9091-9055 OT Time Calculation (min): 36 min Charges:  OT General Charges $OT Visit: 1 Visit OT Evaluation $OT Eval Moderate Complexity: 1 Mod OT Treatments $Self Care/Home Management : 8-22 mins  Warren SAUNDERS., MPH, MS, OTR/L ascom [phone] 05/12/2024, 10:58 AM

## 2024-05-13 DIAGNOSIS — S72002A Fracture of unspecified part of neck of left femur, initial encounter for closed fracture: Secondary | ICD-10-CM | POA: Diagnosis not present

## 2024-05-13 LAB — CBC
HCT: 22.5 % — ABNORMAL LOW (ref 36.0–46.0)
HCT: 25.4 % — ABNORMAL LOW (ref 36.0–46.0)
HCT: 28.3 % — ABNORMAL LOW (ref 36.0–46.0)
Hemoglobin: 7.2 g/dL — ABNORMAL LOW (ref 12.0–15.0)
Hemoglobin: 8.6 g/dL — ABNORMAL LOW (ref 12.0–15.0)
Hemoglobin: 9.5 g/dL — ABNORMAL LOW (ref 12.0–15.0)
MCH: 30.4 pg (ref 26.0–34.0)
MCH: 31.2 pg (ref 26.0–34.0)
MCH: 30.7 pg (ref 26.0–34.0)
MCHC: 32 g/dL (ref 30.0–36.0)
MCHC: 33.9 g/dL (ref 30.0–36.0)
MCHC: 33.6 g/dL (ref 30.0–36.0)
MCV: 94.9 fL (ref 80.0–100.0)
MCV: 92 fL (ref 80.0–100.0)
MCV: 91.6 fL (ref 80.0–100.0)
Platelets: 148 K/uL — ABNORMAL LOW (ref 150–400)
Platelets: 135 K/uL — ABNORMAL LOW (ref 150–400)
Platelets: 156 K/uL (ref 150–400)
RBC: 2.37 MIL/uL — ABNORMAL LOW (ref 3.87–5.11)
RBC: 2.76 MIL/uL — ABNORMAL LOW (ref 3.87–5.11)
RBC: 3.09 MIL/uL — ABNORMAL LOW (ref 3.87–5.11)
RDW: 13.2 % (ref 11.5–15.5)
RDW: 13.9 % (ref 11.5–15.5)
RDW: 13.9 % (ref 11.5–15.5)
WBC: 5.5 K/uL (ref 4.0–10.5)
WBC: 6.6 K/uL (ref 4.0–10.5)
WBC: 7.9 K/uL (ref 4.0–10.5)
nRBC: 0 % (ref 0.0–0.2)
nRBC: 0 % (ref 0.0–0.2)
nRBC: 0 % (ref 0.0–0.2)

## 2024-05-13 LAB — BASIC METABOLIC PANEL WITH GFR
Anion gap: 7 (ref 5–15)
BUN: 57 mg/dL — ABNORMAL HIGH (ref 8–23)
CO2: 29 mmol/L (ref 22–32)
Calcium: 8.1 mg/dL — ABNORMAL LOW (ref 8.9–10.3)
Chloride: 107 mmol/L (ref 98–111)
Creatinine, Ser: 1.75 mg/dL — ABNORMAL HIGH (ref 0.44–1.00)
GFR, Estimated: 26 mL/min — ABNORMAL LOW
Glucose, Bld: 119 mg/dL — ABNORMAL HIGH (ref 70–99)
Potassium: 4.1 mmol/L (ref 3.5–5.1)
Sodium: 142 mmol/L (ref 135–145)

## 2024-05-13 LAB — ABO/RH: ABO/RH(D): A POS

## 2024-05-13 LAB — PREPARE RBC (CROSSMATCH)

## 2024-05-13 MED ORDER — SODIUM CHLORIDE 0.9% IV SOLUTION: Freq: Once | INTRAVENOUS | Status: DC

## 2024-05-13 MED ORDER — ENOXAPARIN SODIUM 30 MG/0.3ML IJ SOSY: 30.0000 mg | PREFILLED_SYRINGE | INTRAMUSCULAR | 0 refills | Status: DC

## 2024-05-13 MED ORDER — ACETAMINOPHEN 325 MG PO TABS: 650.0000 mg | ORAL_TABLET | Freq: Three times a day (TID) | ORAL | Status: DC

## 2024-05-13 MED ORDER — AMLODIPINE BESYLATE 5 MG PO TABS
2.5000 mg | ORAL_TABLET | Freq: Every day | ORAL | Status: DC
  Administered 2024-05-13 – 2024-05-14 (×2): 2.5 mg via ORAL
  Filled 2024-05-13 (×2): qty 1

## 2024-05-13 MED ORDER — SODIUM CHLORIDE 0.9 % IV SOLN: INTRAVENOUS | Status: DC

## 2024-05-13 MED ORDER — ENOXAPARIN SODIUM 30 MG/0.3ML IJ SOSY
30.0000 mg | PREFILLED_SYRINGE | INTRAMUSCULAR | Status: DC
  Administered 2024-05-13 – 2024-05-16 (×4): 30 mg via SUBCUTANEOUS
  Filled 2024-05-13 (×3): qty 0.3

## 2024-05-13 MED ORDER — OXYCODONE HCL 5 MG PO TABS: 2.5000 mg | ORAL_TABLET | Freq: Three times a day (TID) | ORAL | 0 refills | Status: DC | PRN

## 2024-05-13 NOTE — Progress Notes (Signed)
 Physical Therapy Treatment Patient Details Name: Anna Barrett MRN: [id number] DOB: Sep 29, 1922 Today's Date: 05/13/2024   History of Present Illness Patient is a 89 year old female with mechanical fall and hip pain. S/p intramedullary rod left hip for hip fracture. PMH: sCHF with EF 35-40%, HTN,  HLD, CAD, MI, asthma, GERD, CKD-3A, A-fib not on anticoagulants, anemia.    PT Comments  Pt received in bed this pm, ready for PT, Tylenol  and Robaxin  on board, granddaughter at bedside. Pt tolerated AA/AROM L LE prior to sitting EOB. MaxA for all mobility, transfers, balance while standing at RW and side stepping bed<>chair due to pain. Did not feel safe leaving pt up in chair due to heavy assist. Pt positioned in chair position in bed and comfortable. Pt remains very motivated and cooperative, may need to pursue other pain med options in order to progress mobility.    If plan is discharge home, recommend the following: Two people to help with walking and/or transfers;Two people to help with bathing/dressing/bathroom;Assist for transportation;Help with stairs or ramp for entrance;Assistance with cooking/housework   Can travel by private vehicle     No  Equipment Recommendations  None recommended by PT (TBD, pt has a Rollator at home)    Recommendations for Other Services       Precautions / Restrictions Precautions Precautions: Fall Recall of Precautions/Restrictions: Impaired Restrictions Weight Bearing Restrictions Per Provider Order: Yes LLE Weight Bearing Per Provider Order: Weight bearing as tolerated Other Position/Activity Restrictions:  (L Hip ORIF)     Mobility  Bed Mobility Overal bed mobility: Needs Assistance Bed Mobility: Supine to Sit, Sit to Supine     Supine to sit: Max assist Sit to supine: Total assist   General bed mobility comments: significant assistance for bed mobility    Transfers Overall transfer level: Needs assistance Equipment used: Rolling walker (2  wheels) Transfers: Sit to/from Stand, Bed to chair/wheelchair/BSC Sit to Stand: Max assist   Step pivot transfers: Mod assist       General transfer comment:  (MaxA to stand from bed and recliner, Mod/MaxA to side step bed<>chair with Youth RW)    Ambulation/Gait               General Gait Details:  (Unable to tolerate due to pain)   Stairs             Wheelchair Mobility     Tilt Bed    Modified Rankin (Stroke Patients Only)       Balance Overall balance assessment: Needs assistance Sitting-balance support: Feet supported, Bilateral upper extremity supported Sitting balance-Leahy Scale: Fair Sitting balance - Comments:  (Able to sit EOB with SBA)   Standing balance support: Bilateral upper extremity supported, During functional activity, Reliant on assistive device for balance Standing balance-Leahy Scale: Poor Standing balance comment:  (ModA for static standing at [employer])                            Communication Communication Communication: Impaired Factors Affecting Communication: Hearing impaired  Cognition Arousal: Alert Behavior During Therapy: WFL for tasks assessed/performed   PT - Cognitive impairments: History of cognitive impairments, Memory, Sequencing                         Following commands: Impaired Following commands impaired: Follows one step commands inconsistently, Follows one step commands with increased time    Cueing Cueing Techniques: Verbal  cues, Tactile cues  Exercises Total Joint Exercises Ankle Circles/Pumps: AROM, Both, 10 reps Quad Sets: AROM, Both, 5 reps Heel Slides: AAROM, Left, 5 reps Hip ABduction/ADduction: AAROM, Left, 5 reps    General Comments General comments (skin integrity, edema, etc.):  (Pt premedicated with Tylenol  and Robaxin  prior to session)      Pertinent Vitals/Pain Pain Assessment Pain Assessment: Faces Faces Pain Scale: Hurts whole lot Pain Location: LLE Pain  Descriptors / Indicators: Grimacing, Guarding, Moaning Pain Intervention(s): Limited activity within patient's tolerance, Premedicated before session    Home Living                          Prior Function            PT Goals (current goals can now be found in the care plan section) Acute Rehab PT Goals Patient Stated Goal: family goal is SNF    Frequency    7X/week      PT Plan      Co-evaluation              AM-PAC PT 6 Clicks Mobility   Outcome Measure  Help needed turning from your back to your side while in a flat bed without using bedrails?: Total Help needed moving from lying on your back to sitting on the side of a flat bed without using bedrails?: Total Help needed moving to and from a bed to a chair (including a wheelchair)?: Total Help needed standing up from a chair using your arms (e.g., wheelchair or bedside chair)?: Total Help needed to walk in hospital room?: Total Help needed climbing 3-5 steps with a railing? : Total 6 Click Score: 6    End of Session Equipment Utilized During Treatment: Gait belt Activity Tolerance: Patient limited by pain Patient left: in bed;with call bell/phone within reach;with family/visitor present Nurse Communication: Mobility status PT Visit Diagnosis: Difficulty in walking, not elsewhere classified (R26.2);Pain Pain - Right/Left: Left Pain - part of body: Hip     Time: 8360-8281 PT Time Calculation (min) (ACUTE ONLY): 39 min  Charges:    $Therapeutic Exercise: 8-22 mins $Therapeutic Activity: 23-37 mins PT General Charges $$ ACUTE PT VISIT: 1 Visit                    Darice Bohr, PTA  Darice JAYSON Bohr 05/13/2024, 5:36 PM

## 2024-05-13 NOTE — [place] FL2 (Signed)
 " [place]  MEDICAID FL2 LEVEL OF CARE FORM     IDENTIFICATION  Patient Name: Anna Barrett Birthdate: 1922-12-02 Sex: female Admission Date (Current Location): 05/10/2024  [facility] and [place] Number:  Chiropodist and Address:  [facility], [address], [place], [place] [address]      Provider Number: [id number]  Attending Physician Name and Address:  Kandis Devaughn Sayres, MD  Relative Name and Phone Number:  TISA WEISEL [phone]    Current Level of Care: Hospital Recommended Level of Care: Skilled Nursing Facility Prior Approval Number:    Date Approved/Denied:   PASRR Number: [id number]  Discharge Plan: SNF    Current Diagnoses: Patient Active Problem List   Diagnosis Date Noted   Acute blood loss anemia 05/12/2024   Closed left hip fracture (HCC) 05/10/2024   Fall at home, initial encounter 05/10/2024   Chronic combined systolic and diastolic CHF (congestive heart failure) (HCC) 05/10/2024   Acute on chronic systolic CHF (congestive heart failure) (HCC) 10/28/2023   Asthma, chronic 10/28/2023   CAD (coronary artery disease) 10/28/2023   Myocardial injury 10/28/2023   Chronic kidney disease, stage 3a (HCC) 10/28/2023   Atrial fibrillation, chronic (HCC) 10/28/2023   CAP (community acquired pneumonia) 10/28/2023   Protein-calorie malnutrition, severe 10/28/2023   Dysphagia 06/07/2021   Esophageal stricture 06/05/2021   Asthma without status asthmaticus 06/03/2017   Carotid artery disease 06/03/2017   Hypercholesterolemia with hypertriglyceridemia 06/03/2017   Hyperglycemia, unspecified 06/03/2017   Hypertension 06/03/2017   Ischemic cardiomyopathy 06/03/2017   Parathyroid adenoma 06/03/2017   Aortic atherosclerosis 12/24/2016   Closed left ankle fracture 04/16/2015   Senile purpura 01/10/2015   CKD (chronic kidney disease) stage 3, GFR 30-59 ml/min (HCC) 09/28/2013   A-fib (HCC) 09/24/2013    Orientation  RESPIRATION BLADDER Height & Weight     Self  Normal Continent Weight: 44.2 kg Height:  4' 11 (149.9 cm)  BEHAVIORAL SYMPTOMS/MOOD NEUROLOGICAL BOWEL NUTRITION STATUS      Continent Diet (DYS 3)  AMBULATORY STATUS COMMUNICATION OF NEEDS Skin   Extensive Assist Verbally Surgical wounds, Skin abrasions (Abrasion- LT lower arm, dressing in place. Surgical- LT hip, dressing in place)                       Personal Care Assistance Level of Assistance  Bathing, Feeding, Dressing Bathing Assistance: Maximum assistance Feeding assistance: Maximum assistance Dressing Assistance: Maximum assistance     Functional Limitations Info             SPECIAL CARE FACTORS FREQUENCY  PT (By licensed PT), OT (By licensed OT)     PT Frequency: 5x week OT Frequency: 5x week            Contractures      Additional Factors Info  Code Status, Allergies Code Status Info: DNR- Limited Allergies Info: Shellfish Allergy, Atorvastatin, Iodine           Current Medications (05/13/2024):  This is the current hospital active medication list Current Facility-Administered Medications  Medication Dose Route Frequency Provider Last Rate Last Admin   0.9 %  sodium chloride  infusion (Manually program via Guardrails IV Fluids)   Intravenous Once Donati-Garmon, Natalie M, NP   Stopped at 05/13/24 0712   0.9 %  sodium chloride  infusion   Intravenous Continuous Kandis Devaughn Sayres, MD 75 mL/hr at 05/12/24 1714 New Bag at 05/12/24 1714   acetaminophen  (TYLENOL ) tablet 650 mg  650 mg Oral  TID Ponnala, Shruthi, MD   650 mg at 05/13/24 0754   albuterol  (PROVENTIL ) (2.5 MG/3ML) 0.083% nebulizer solution 2.5 mg  2.5 mg Inhalation Q4H PRN Ezra Jackquline RAMAN, MD       carvedilol  (COREG ) tablet 3.125 mg  3.125 mg Oral BID WC Dalton, Stewart S, MD   3.125 mg at 05/13/24 9245   Chlorhexidine Gluconate Cloth 2 % PADS 6 each  6 each Topical Daily Kandis Devaughn Sayres, MD   6 each at 05/13/24 0935   cholecalciferol   (VITAMIN D3) 25 MCG (1000 UNIT) tablet 1,000 Units  1,000 Units Oral BID Ezra Jackquline RAMAN, MD   1,000 Units at 05/13/24 9095   dextromethorphan-guaiFENesin  (MUCINEX  DM) 30-600 MG per 12 hr tablet 1 tablet  1 tablet Oral BID PRN Ezra Jackquline RAMAN, MD       enoxaparin  (LOVENOX ) injection 30 mg  30 mg Subcutaneous Q24H Charlene Debby BROCKS, PA-C   30 mg at 05/13/24 1140   feeding supplement (GLUCERNA SHAKE) (GLUCERNA SHAKE) liquid 237 mL  237 mL Oral TID BM Ezra Jackquline RAMAN, MD   237 mL at 05/13/24 9094   fluticasone  furoate-vilanterol (BREO ELLIPTA ) 100-25 MCG/ACT 1 puff  1 puff Inhalation Daily Ezra Jackquline RAMAN, MD   1 puff at 05/13/24 0800   hydrALAZINE  (APRESOLINE ) injection 5 mg  5 mg Intravenous Q2H PRN Ezra Jackquline RAMAN, MD       lactulose (CHRONULAC) 10 GM/15ML solution 30 g  30 g Oral Once Wouk, Devaughn Sayres, MD       lidocaine  (LIDODERM ) 5 % 1 patch  1 patch Transdermal Q24H Ezra Jackquline RAMAN, MD   1 patch at 05/13/24 9364   methocarbamol  (ROBAXIN ) tablet 500 mg  500 mg Oral Q8H PRN Ezra Jackquline RAMAN, MD   500 mg at 05/11/24 0039   multivitamin with minerals tablet 1 tablet  1 tablet Oral Daily Ezra Jackquline RAMAN, MD   1 tablet at 05/13/24 9095   nitroGLYCERIN  (NITROSTAT ) SL tablet 0.4 mg  0.4 mg Sublingual Q5 min PRN Ezra Jackquline RAMAN, MD       ondansetron  (ZOFRAN ) injection 4 mg  4 mg Intravenous Q8H PRN Ezra Jackquline RAMAN, MD   4 mg at 05/12/24 1032   oxyCODONE  (Oxy IR/ROXICODONE ) immediate release tablet 2.5 mg  2.5 mg Oral Q4H PRN Ponnala, Shruthi, MD   2.5 mg at 05/11/24 2122   oxyCODONE  (Oxy IR/ROXICODONE ) immediate release tablet 5 mg  5 mg Oral Q4H PRN Ponnala, Shruthi, MD   5 mg at 05/11/24 1631   pantoprazole  (PROTONIX ) EC tablet 40 mg  40 mg Oral Daily Ezra Jackquline RAMAN, MD   40 mg at 05/13/24 0904   polyethylene glycol (MIRALAX / GLYCOLAX) packet 34 g  34 g Oral Daily Kandis Devaughn Sayres, MD   34 g at 05/13/24 9094   senna (SENOKOT) tablet 8.6 mg  1 tablet Oral BID Ezra Jackquline RAMAN,  MD   8.6 mg at 05/13/24 9095     Discharge Medications: Please see discharge summary for a list of discharge medications.  Relevant Imaging Results:  Relevant Lab Results:   Additional Information SS# [id number]  Daved JONETTA Hamilton, RN     "

## 2024-05-13 NOTE — Plan of Care (Signed)

## 2024-05-13 NOTE — Progress Notes (Addendum)
 " PROGRESS NOTE    Anna Barrett  FMW:[id number] DOB: 1923/02/25 DOA: 05/10/2024 PCP: Lenon Layman ORN, MD  Chief Complaint  Patient presents with   [facility] Course:  Anna Barrett is a 89 y.o. female with medical history significant of  sCHF with EF 35-40%, HTN,  HLD, CAD, MI, asthma, GERD, CKD-3A, A-fib not on anticoagulants, anemia, who presents with mechanical fall and left hip pain.    Per his son and granddaughter at the bedside, patient fell accidentally when she was walking at home round 10:00 p.m.  No LOC.  Her granddaughter is very sure that the patient did not have head or neck injury.  Family does not want to do CT scan of the head and neck.  Patient developed left hip pain, which is constant, severe, sharp, nonradiating, aggravated by movement.  Left leg is shortened and externally rotated.  No chest pain, cough, SOB.  Patient has nausea, no vomiting, diarrhea or abdominal pain.  No symptoms of UTI.  Workup revealed intertrochanteric fracture of the left hip, hospital course as below  Subjective:  Awake and alert. Not complaining of pain. No bm   Objective: Vitals:   05/13/24 0604 05/13/24 0712 05/13/24 0739 05/13/24 0754  BP:   (!) 151/67 (!) 151/67  Pulse: 86  83   Resp: 16  16   Temp: 98.6 F (37 C)  98.1 F (36.7 C)   TempSrc: Oral     SpO2: 97%  97%   Weight:  44.2 kg    Height:        Intake/Output Summary (Last 24 hours) at 05/13/2024 1334 Last data filed at 05/13/2024 0610 Gross per 24 hour  Intake 506 ml  Output 200 ml  Net 306 ml   Filed Weights   05/11/24 0102 05/12/24 0420 05/13/24 0712  Weight: 44.1 kg 43.5 kg 44.2 kg    Examination: General: Not in acute distress Cardiac: S1/S2, RRR, No murmurs,  Respiratory: No rales, wheezing, rhonchi or rubs GI: Soft, nondistended, nontender  Ext: No pitting leg edema bilaterally.   Musculoskeletal: not assessed Skin: No rashes. Dressing left lateral hip c/d/i Neuro: Drowsy    Assessment  & Plan:  Closed left hip fracture (HCC) X-ray showed intertrochanteric fracture of the left hip S/p left hip cephalomedullary nailing 12/30 Pain management with Tylenol , low-dose opioids PT/OT advising snf, toc engaged Will query why still has foley, likely can be discontinued  Constipation Son reports no bm one week.   - continue senna/miralax - lactulose ordered yesterday but not given, will speak with nursing  Mild hyperkalemia Lokelma  1 dose, resolved  Acute blood loss anemia 2/2 fracture and surgery. Hgb trended to 7s overnight, transfused 1 unit, appropriate response today to 8s. Low suspicion for ongoing bleeding   akiChronic kidney disease, stage 3a (HCC):  Baseline cr 1.1-1.2, today 1.7s slight improvement Hold torsemide  Continue gentle fluids today   Chronic combined systolic and diastolic CHF (congestive heart failure) (HCC) 2D echo on 10/30/2023 showed EF of 35-40% with grade 1 diastolic dysfunction. CHF seems to be compensated. Home torsemide  on hold due to being NPO, mildly elevated Cr   CAD (coronary artery disease) and myocardial injury:  Troponin 27>29, no chest pain.  Likely demand ischemia. Patient stopped taking aspirin  Continue Coreg   Hypertension Elevated today Continue Coreg  Resume home amlodipine  on hold   Asthma, chronic: Stable Bronchodilators, and prn Mucinex   Atrial fibrillation, chronic (HCC): Patient is not taking anticoagulants. -Continue Coreg  3  125 mg twice daily   Protein-calorie malnutrition, severe: Body weight 38.4 kg, BMI 19.6 - Ensure - Nutrition consult   DVT prophylaxis: lovenox    Code Status: Limited: Do not attempt resuscitation (DNR) -DNR-LIMITED -Do Not Intubate/DNI  Disposition: snf Communication: Niece updated @ bedside 1/1  Consultants:  Treatment Team:  Consulting Physician: Ezra Jackquline RAMAN, MD Cardiology  Procedures:  S/p left hip cephalomedullary nailing 12/30  Antimicrobials:  Anti-infectives (From  admission, onward)    Start     Dose/Rate Route Frequency Ordered Stop   05/11/24 1400  ceFAZolin  (ANCEF ) IVPB 2g/100 mL premix        2 g 200 mL/hr over 30 Minutes Intravenous Every 8 hours 05/11/24 0937 05/12/24 0604   05/10/24 2330  azithromycin  (ZITHROMAX ) 500 mg in sodium chloride  0.9 % 250 mL IVPB  Status:  Discontinued        500 mg 250 mL/hr over 60 Minutes Intravenous  Once 05/10/24 2316 05/10/24 2316   05/10/24 2330  cefTRIAXone  (ROCEPHIN ) 1 g in sodium chloride  0.9 % 100 mL IVPB  Status:  Discontinued        1 g 200 mL/hr over 30 Minutes Intravenous  Once 05/10/24 2316 05/10/24 2316       Data Reviewed: I have personally reviewed following labs and imaging studies CBC: Recent Labs  Lab 05/10/24 2237 05/11/24 0829 05/12/24 0605 05/13/24 0017 05/13/24 1126  WBC 12.4* 9.6 6.6 5.5 6.6  NEUTROABS 9.5*  --   --   --   --   HGB 12.5 11.4* 8.2* 7.2* 8.6*  HCT 38.4 35.8* 24.8* 22.5* 25.4*  MCV 93.0 94.5 92.9 94.9 92.0  PLT 243 217 166 148* 135*   Basic Metabolic Panel: Recent Labs  Lab 05/10/24 2237 05/11/24 0829 05/12/24 0605 05/13/24 1126  NA 140 141 141 142  K 4.0 5.3* 4.6 4.1  CL 102 102 104 107  CO2 28 31 29 29   GLUCOSE 189* 167* 156* 119*  BUN 49* 51* 49* 57*  CREATININE 1.34* 1.48* 1.86* 1.75*  CALCIUM 8.7* 9.2 8.5* 8.1*   GFR: Estimated Creatinine Clearance: 11.9 mL/min (A) (by C-G formula based on SCr of 1.75 mg/dL (H)). Liver Function Tests: Recent Labs  Lab 05/10/24 2237  AST 14*  ALT 17  ALKPHOS 76  BILITOT 0.4  PROT 7.1  ALBUMIN 3.8   CBG: No results for input(s): GLUCAP in the last 168 hours.  No results found for this or any previous visit (from the past 240 hours).   Radiology Studies: No results found.   Scheduled Meds:  sodium chloride    Intravenous Once   acetaminophen   650 mg Oral TID   carvedilol   3.125 mg Oral BID WC   Chlorhexidine Gluconate Cloth  6 each Topical Daily   cholecalciferol   1,000 Units Oral BID    enoxaparin  (LOVENOX ) injection  30 mg Subcutaneous Q24H   feeding supplement (GLUCERNA SHAKE)  237 mL Oral TID BM   fluticasone  furoate-vilanterol  1 puff Inhalation Daily   lactulose  30 g Oral Once   lidocaine   1 patch Transdermal Q24H   multivitamin with minerals  1 tablet Oral Daily   pantoprazole   40 mg Oral Daily   polyethylene glycol  34 g Oral Daily   senna  1 tablet Oral BID   Continuous Infusions:  sodium chloride  75 mL/hr at 05/12/24 1714     LOS: 3 days   Devaughn KATHEE Ban, MD [facility]  To contact the attending physician between 7A-7P  please use Epic Chat. To contact the covering physician during after hours 7P-7A, please review Amion.  05/13/2024, 1:34 PM      "

## 2024-05-13 NOTE — Progress Notes (Signed)
"  ° °      Overnight   NAME: Anna Barrett MRN: [id number] DOB : 1922-09-07    Date of Service   05/13/2024   HPI/Events of Note   HPI: 89 year old female with medical history significant for CHF with ejection fraction of 35 to 40%, hypertension, hyperlipidemia, CAD, MI, asthma, GERD, CKD-3A, A-fib not on anticoagulation, anemia who presented to the ED with a mechanical fall and left hip pain.  Patient is 1 day postoperative  intramedullary rod placement in the left hip.   Overnight: Symptomatic anemia requiring 1 unit of packed red blood cells.  RN informed me of a hemoglobin drop from 11.4 on 12/30-8.2 on 12/31.  Repeat CBC to verify.  Repeat CBC with hemoglobin of 7.2.  RN reports hypotension at the time.  On assessment patient has no obvious signs of bleeding, incision site dressing clean dry and intact.      Interventions/ Plan   Transfuse 1 unit of packed red blood cells, and repeat CBC posttransfusion Monitor blood pressure closely.    [name] BSN RN CCRN AGACNP-BC Acute Care Nurse Practitioner [facility]  "

## 2024-05-13 NOTE — Progress Notes (Signed)
" ° °  Subjective: 2 Days Post-Op Procedures (LRB): FIXATION, FRACTURE, INTERTROCHANTERIC, WITH INTRAMEDULLARY ROD (Left) Patient is sleeping.  Family at bedside.  Son states mental status improving, seems to be more coherent.  Pain seems to be well-controlled. Patient is well, and has had no acute complaints or problems 1 unit of blood given last night.  CBC pending this morning vital signs stable We will continue therapy today.  Plan is to go Skilled nursing facility after hospital stay.  Objective: Vital signs in last 24 hours: Temp:  [97.5 F (36.4 C)-98.6 F (37 C)] 98.1 F (36.7 C) (01/01 0739) Pulse Rate:  [71-91] 83 (01/01 0739) Resp:  [15-20] 16 (01/01 0739) BP: (87-151)/(43-77) 151/67 (01/01 0754) SpO2:  [92 %-100 %] 97 % (01/01 0739) Weight:  [44.2 kg] 44.2 kg (01/01 0712)  Intake/Output from previous day: 12/31 0701 - 01/01 0700 In: 556 [P.O.:250; Blood:306] Out: 200 [Urine:200] Intake/Output this shift: No intake/output data recorded.  Recent Labs    05/10/24 2237 05/11/24 0829 05/12/24 0605 05/13/24 0017  HGB 12.5 11.4* 8.2* 7.2*   Recent Labs    05/12/24 0605 05/13/24 0017  WBC 6.6 5.5  RBC 2.67* 2.37*  HCT 24.8* 22.5*  PLT 166 148*   Recent Labs    05/11/24 0829 05/12/24 0605  NA 141 141  K 5.3* 4.6  CL 102 104  CO2 31 29  BUN 51* 49*  CREATININE 1.48* 1.86*  GLUCOSE 167* 156*  CALCIUM 9.2 8.5*   Recent Labs    05/11/24 0129  INR 1.1    EXAM General - Patient is sleeping Extremity - Neurovascular intact Intact pulses distally Thigh is soft, no swelling or ecchymosis. Negative Homans' sign bilaterally Dressing - dressing C/D/I and no drainage Motor Function - intact, moving foot and toes well on exam.   Past Medical History:  Diagnosis Date   Asthma    Carotid stenosis    Headache    MIGRAINES   History of MI (myocardial infarction)    Hyperglycemia    Hyperlipidemia    Hypertension    Ischemic cardiomyopathy     Myocardial infarction (HCC) 1962   Osteoporosis    Parathyroid adenoma     Assessment/Plan:   2 Days Post-Op Procedures (LRB): FIXATION, FRACTURE, INTERTROCHANTERIC, WITH INTRAMEDULLARY ROD (Left) Principal Problem:   Closed left hip fracture (HCC) Active Problems:   Hypertension   Asthma, chronic   CAD (coronary artery disease)   Myocardial injury   Chronic kidney disease, stage 3a (HCC)   Atrial fibrillation, chronic (HCC)   Protein-calorie malnutrition, severe   Fall at home, initial encounter   Chronic combined systolic and diastolic CHF (congestive heart failure) (HCC)   Acute blood loss anemia  Estimated body mass index is 19.68 kg/m as calculated from the following:   Height as of this encounter: 4' 11 (1.499 m).   Weight as of this encounter: 44.2 kg. Advance diet Up with therapy Pain well controlled, mostly Tylenol  VSS Continue to work on bowel movement Hgb 7.2 last night, 1 unit of packed red blood cells given overnight.  Post transfusion CBC pending.  CM to assist with discharge to SNF  Follow up with [facility] in 2 weeeks  DVT Prophylaxis - Lovenox , TED hose, and SCDs Weight-Bearing as tolerated to left leg   T. Medford Amber, PA-C [facility] 05/13/2024, 11:16 AM   "

## 2024-05-13 NOTE — Plan of Care (Signed)
" °  Problem: Pain Managment: Goal: General experience of comfort will improve and/or be controlled Outcome: Progressing   Problem: Clinical Measurements: Goal: Postoperative complications will be avoided or minimized Outcome: Progressing   Problem: Skin Integrity: Goal: Demonstrates signs of wound healing without infection Outcome: Progressing   Problem: Urinary Elimination: Goal: Will remain free from infection Outcome: Progressing   Problem: Urinary Elimination: Goal: Ability to achieve and maintain adequate urine output Outcome: Progressing   "

## 2024-05-13 NOTE — Care Management Important Message (Signed)
 Important Message  Patient Details  Name: Anna Barrett MRN: [id number] Date of Birth: Jan 21, 1923   Important Message Given:  Yes - Medicare IM     [name], CMA 05/13/2024, 11:56 AM

## 2024-05-14 DIAGNOSIS — S72002A Fracture of unspecified part of neck of left femur, initial encounter for closed fracture: Secondary | ICD-10-CM | POA: Diagnosis not present

## 2024-05-14 LAB — BASIC METABOLIC PANEL WITH GFR
Anion gap: 6 (ref 5–15)
BUN: 52 mg/dL — ABNORMAL HIGH (ref 8–23)
CO2: 28 mmol/L (ref 22–32)
Calcium: 8.1 mg/dL — ABNORMAL LOW (ref 8.9–10.3)
Chloride: 111 mmol/L (ref 98–111)
Creatinine, Ser: 1.38 mg/dL — ABNORMAL HIGH (ref 0.44–1.00)
GFR, Estimated: 34 mL/min — ABNORMAL LOW
Glucose, Bld: 103 mg/dL — ABNORMAL HIGH (ref 70–99)
Potassium: 4.2 mmol/L (ref 3.5–5.1)
Sodium: 145 mmol/L (ref 135–145)

## 2024-05-14 LAB — TYPE AND SCREEN
ABO/RH(D): A POS
Antibody Screen: NEGATIVE
Unit division: 0

## 2024-05-14 LAB — BPAM RBC
Blood Product Expiration Date: 202601312359
ISSUE DATE / TIME: 202601010327
Unit Type and Rh: 6200

## 2024-05-14 LAB — CBC
HCT: 28 % — ABNORMAL LOW (ref 36.0–46.0)
Hemoglobin: 8.9 g/dL — ABNORMAL LOW (ref 12.0–15.0)
MCH: 30.5 pg (ref 26.0–34.0)
MCHC: 31.8 g/dL (ref 30.0–36.0)
MCV: 95.9 fL (ref 80.0–100.0)
Platelets: 153 K/uL (ref 150–400)
RBC: 2.92 MIL/uL — ABNORMAL LOW (ref 3.87–5.11)
RDW: 13.8 % (ref 11.5–15.5)
WBC: 6.5 K/uL (ref 4.0–10.5)
nRBC: 0 % (ref 0.0–0.2)

## 2024-05-14 MED ORDER — LACTULOSE 10 GM/15ML PO SOLN
30.0000 g | Freq: Once | ORAL | Status: AC
  Administered 2024-05-14: 30 g via ORAL
  Filled 2024-05-14: qty 60

## 2024-05-14 MED ORDER — GLYCERIN (LAXATIVE) 2 G RE SUPP
1.0000 | Freq: Once | RECTAL | Status: AC
  Administered 2024-05-14: 1 via RECTAL
  Filled 2024-05-14 (×2): qty 1

## 2024-05-14 MED ORDER — ACETAMINOPHEN 325 MG PO TABS: 650.0000 mg | ORAL_TABLET | Freq: Four times a day (QID) | ORAL | Status: DC | PRN

## 2024-05-14 NOTE — Progress Notes (Signed)
" ° °  Subjective: 3 Days Post-Op Procedures (LRB): FIXATION, FRACTURE, INTERTROCHANTERIC, WITH INTRAMEDULLARY ROD (Left) Patient is doing well. Urinary retention, mild discomfort. Pending urine cath.  Left Hip pain seems to be well-controlled. Patient is well, and has had no acute complaints or problems 1 unit of blood given last night.  CBC pending this morning vital signs stable We will continue therapy today.  Plan is to go Skilled nursing facility after hospital stay.  Objective: Vital signs in last 24 hours: Temp:  [97.4 F (36.3 C)-99.5 F (37.5 C)] 97.4 F (36.3 C) (01/02 0741) Pulse Rate:  [47-87] 87 (01/02 0741) Resp:  [15-18] 17 (01/02 0741) BP: (129-151)/(54-59) 151/56 (01/02 0741) SpO2:  [89 %-93 %] 89 % (01/02 0741)  Intake/Output from previous day: 01/01 0701 - 01/02 0700 In: 212.7 [I.V.:212.7] Out: 400 [Urine:400] Intake/Output this shift: No intake/output data recorded.  Recent Labs    05/11/24 0829 05/12/24 0605 05/13/24 0017 05/13/24 1126 05/13/24 1406  HGB 11.4* 8.2* 7.2* 8.6* 9.5*   Recent Labs    05/13/24 1126 05/13/24 1406  WBC 6.6 7.9  RBC 2.76* 3.09*  HCT 25.4* 28.3*  PLT 135* 156   Recent Labs    05/13/24 1126 05/14/24 0503  NA 142 145  K 4.1 4.2  CL 107 111  CO2 29 28  BUN 57* 52*  CREATININE 1.75* 1.38*  GLUCOSE 119* 103*  CALCIUM 8.1* 8.1*   No results for input(s): LABPT, INR in the last 72 hours.   EXAM General - Patient is alert and oriented Extremity - Neurovascular intact Intact pulses distally Thigh is soft, no swelling or ecchymosis. Negative Homans' sign bilaterally Dressing - dressing C/D/I and no drainage Motor Function - intact, moving foot and toes well on exam.   Past Medical History:  Diagnosis Date   Asthma    Carotid stenosis    Headache    MIGRAINES   History of MI (myocardial infarction)    Hyperglycemia    Hyperlipidemia    Hypertension    Ischemic cardiomyopathy    Myocardial infarction  (HCC) 1962   Osteoporosis    Parathyroid adenoma     Assessment/Plan:   3 Days Post-Op Procedures (LRB): FIXATION, FRACTURE, INTERTROCHANTERIC, WITH INTRAMEDULLARY ROD (Left) Principal Problem:   Closed left hip fracture (HCC) Active Problems:   Hypertension   Asthma, chronic   CAD (coronary artery disease)   Myocardial injury   Chronic kidney disease, stage 3a (HCC)   Atrial fibrillation, chronic (HCC)   Protein-calorie malnutrition, severe   Fall at home, initial encounter   Chronic combined systolic and diastolic CHF (congestive heart failure) (HCC)   Acute blood loss anemia  Estimated body mass index is 19.68 kg/m as calculated from the following:   Height as of this encounter: 4' 11 (1.499 m).   Weight as of this encounter: 44.2 kg. Advance diet Up with therapy Pain well controlled, mostly Tylenol  VSS Continue to work on bowel movement Hgb 9.5 s/p 1 unit of packed red blood cells 05/13/24. CBC pending this am CM to assist with discharge to SNF  Follow up with [facility] in 2 weeeks Lovenox  30 mg SQ daily x 2 weeks at discharge  DVT Prophylaxis - Lovenox , TED hose, and SCDs Weight-Bearing as tolerated to left leg   T. Medford Amber, PA-C [facility] 05/14/2024, 8:16 AM   "

## 2024-05-14 NOTE — Progress Notes (Signed)
 " PROGRESS NOTE    Anna Barrett  FMW:[id number] DOB: 02-17-1923 DOA: 05/10/2024 PCP: Lenon Layman ORN, MD  Chief Complaint  Patient presents with   [facility] Course:  Anna Barrett is a 89 y.o. female with medical history significant of  sCHF with EF 35-40%, HTN,  HLD, CAD, MI, asthma, GERD, CKD-3A, A-fib not on anticoagulants, anemia, who presents with mechanical fall and left hip pain.    Per his son and granddaughter at the bedside, patient fell accidentally when she was walking at home round 10:00 p.m.  No LOC.  Her granddaughter is very sure that the patient did not have head or neck injury.  Family does not want to do CT scan of the head and neck.  Patient developed left hip pain, which is constant, severe, sharp, nonradiating, aggravated by movement.  Left leg is shortened and externally rotated.  No chest pain, cough, SOB.  Patient has nausea, no vomiting, diarrhea or abdominal pain.  No symptoms of UTI.  Workup revealed intertrochanteric fracture of the left hip, hospital course as below  Subjective:  Awake and alert. Not complaining of pain. No bm yet. More awake and alert. Tolerating diet   Objective: Vitals:   05/13/24 1643 05/13/24 2034 05/14/24 0318 05/14/24 0741  BP: (!) 133/55 (!) 134/54 (!) 129/59 (!) 151/56  Pulse: (!) 47 85 84 87  Resp: 18 15 16 17   Temp: 98.4 F (36.9 C) 99.5 F (37.5 C) (!) 97.4 F (36.3 C) (!) 97.4 F (36.3 C)  TempSrc:  Axillary Axillary Oral  SpO2: 92% 93% 92% (!) 89%  Weight:      Height:        Intake/Output Summary (Last 24 hours) at 05/14/2024 1225 Last data filed at 05/14/2024 0900 Gross per 24 hour  Intake 432.66 ml  Output 400 ml  Net 32.66 ml   Filed Weights   05/11/24 0102 05/12/24 0420 05/13/24 0712  Weight: 44.1 kg 43.5 kg 44.2 kg    Examination: General: Not in acute distress Cardiac: S1/S2, RRR, No murmurs,  Respiratory: No rales, wheezing, rhonchi or rubs GI: Soft, nondistended, nontender  Ext: No  pitting leg edema bilaterally.   Musculoskeletal: not assessed Skin: No rashes. Dressing left lateral hip c/d/i Neuro: Drowsy    Assessment & Plan:  Closed left hip fracture (HCC) X-ray showed intertrochanteric fracture of the left hip S/p left hip cephalomedullary nailing 12/30 Pain management with Tylenol , low-dose opioids PT/OT advising snf, toc engaged Will query why still has foley, likely can be discontinued  Constipation Son reports no bm one week.   - continue senna/miralax - repeat lactulose - enema tomorrow if no bm  Acute urinary retention Foley discontinued yesterday, retaining this morning, nursing failed I/o cath, son reports since then patient has voided twice - repeat bladder scan - urology advises foley if still retaining  Mild hyperkalemia Lokelma  1 dose, resolved  Acute blood loss anemia 2/2 fracture and surgery. Hgb trended to 7s overnight, transfused 1 unit, appropriate response   Aki on Chronic kidney disease, stage 3a [facility]):  Resolved Home torsemide  on hold Will monitor off fluids   Chronic combined systolic and diastolic CHF (congestive heart failure) (HCC) 2D echo on 10/30/2023 showed EF of 35-40% with grade 1 diastolic dysfunction. CHF seems to be compensated. Home torsemide  on hold due to being NPO, mildly elevated Cr   CAD (coronary artery disease) and myocardial injury:  Troponin 27>29, no chest pain.  Likely demand ischemia.  Patient stopped taking aspirin  Continue Coreg   Hypertension Mild elevation today Continue Coreg  Resumed home amlodipine     Asthma, chronic: Stable Bronchodilators, and prn Mucinex   Atrial fibrillation, chronic (HCC): Patient is not taking anticoagulants. -Continue Coreg  3 125 mg twice daily   Protein-calorie malnutrition, severe: Body weight 38.4 kg, BMI 19.6 - Ensure - Nutrition consult   DVT prophylaxis: lovenox    Code Status: Limited: Do not attempt resuscitation (DNR) -DNR-LIMITED -Do Not Intubate/DNI   Disposition: snf Communication: son updated @ bedside 1/2  Consultants:  Treatment Team:  Consulting Physician: Ezra Jackquline RAMAN, MD Cardiology  Procedures:  S/p left hip cephalomedullary nailing 12/30  Antimicrobials:  Anti-infectives (From admission, onward)    Start     Dose/Rate Route Frequency Ordered Stop   05/11/24 1400  ceFAZolin  (ANCEF ) IVPB 2g/100 mL premix        2 g 200 mL/hr over 30 Minutes Intravenous Every 8 hours 05/11/24 0937 05/12/24 0604   05/10/24 2330  azithromycin  (ZITHROMAX ) 500 mg in sodium chloride  0.9 % 250 mL IVPB  Status:  Discontinued        500 mg 250 mL/hr over 60 Minutes Intravenous  Once 05/10/24 2316 05/10/24 2316   05/10/24 2330  cefTRIAXone  (ROCEPHIN ) 1 g in sodium chloride  0.9 % 100 mL IVPB  Status:  Discontinued        1 g 200 mL/hr over 30 Minutes Intravenous  Once 05/10/24 2316 05/10/24 2316       Data Reviewed: I have personally reviewed following labs and imaging studies CBC: Recent Labs  Lab 05/10/24 2237 05/11/24 0829 05/12/24 0605 05/13/24 0017 05/13/24 1126 05/13/24 1406 05/14/24 1134  WBC 12.4*   < > 6.6 5.5 6.6 7.9 6.5  NEUTROABS 9.5*  --   --   --   --   --   --   HGB 12.5   < > 8.2* 7.2* 8.6* 9.5* 8.9*  HCT 38.4   < > 24.8* 22.5* 25.4* 28.3* 28.0*  MCV 93.0   < > 92.9 94.9 92.0 91.6 95.9  PLT 243   < > 166 148* 135* 156 153   < > = values in this interval not displayed.   Basic Metabolic Panel: Recent Labs  Lab 05/10/24 2237 05/11/24 0829 05/12/24 0605 05/13/24 1126 05/14/24 0503  NA 140 141 141 142 145  K 4.0 5.3* 4.6 4.1 4.2  CL 102 102 104 107 111  CO2 28 31 29 29 28   GLUCOSE 189* 167* 156* 119* 103*  BUN 49* 51* 49* 57* 52*  CREATININE 1.34* 1.48* 1.86* 1.75* 1.38*  CALCIUM 8.7* 9.2 8.5* 8.1* 8.1*   GFR: Estimated Creatinine Clearance: 15.2 mL/min (A) (by C-G formula based on SCr of 1.38 mg/dL (H)). Liver Function Tests: Recent Labs  Lab 05/10/24 2237  AST 14*  ALT 17  ALKPHOS 76  BILITOT  0.4  PROT 7.1  ALBUMIN 3.8   CBG: No results for input(s): GLUCAP in the last 168 hours.  No results found for this or any previous visit (from the past 240 hours).   Radiology Studies: No results found.   Scheduled Meds:  sodium chloride    Intravenous Once   acetaminophen   650 mg Oral TID   amLODipine   2.5 mg Oral Daily   carvedilol   3.125 mg Oral BID WC   Chlorhexidine Gluconate Cloth  6 each Topical Daily   cholecalciferol   1,000 Units Oral BID   enoxaparin  (LOVENOX ) injection  30 mg Subcutaneous Q24H   feeding supplement (  GLUCERNA SHAKE)  237 mL Oral TID BM   fluticasone  furoate-vilanterol  1 puff Inhalation Daily   lidocaine   1 patch Transdermal Q24H   multivitamin with minerals  1 tablet Oral Daily   pantoprazole   40 mg Oral Daily   polyethylene glycol  34 g Oral Daily   senna  1 tablet Oral BID   Continuous Infusions:     LOS: 4 days   Devaughn KATHEE Ban, MD [facility]  To contact the attending physician between 7A-7P please use Epic Chat. To contact the covering physician during after hours 7P-7A, please review Amion.  05/14/2024, 12:25 PM      "

## 2024-05-14 NOTE — Plan of Care (Signed)

## 2024-05-14 NOTE — Progress Notes (Signed)
 Occupational Therapy Treatment Patient Details Name: Anna Barrett MRN: [id number] DOB: 1922/11/11 Today's Date: 05/14/2024   History of present illness Patient is a 89 year old female with mechanical fall and hip pain. S/p intramedullary rod left hip for hip fracture. PMH: sCHF with EF 35-40%, HTN,  HLD, CAD, MI, asthma, GERD, CKD-3A, A-fib not on anticoagulants, anemia.   OT comments  Pt seen for OT co-tx with PTA to optimize safety with ADL mobility. Pt premedicated and agreeable to session. Pt required +2 assist for bed mobility, STS, and step pivot from EOB to the recliner but overall progressing since last OT session. Tolerated sitting EOB and able to maintain fair static sitting balance. Pt continues to benefit from skilled OT services to maximize return to PLOF.       If plan is discharge home, recommend the following:  Two people to help with walking and/or transfers;Two people to help with bathing/dressing/bathroom;Assistance with cooking/housework;Assist for transportation;Help with stairs or ramp for entrance;Assistance with feeding;Direct supervision/assist for medications management;Direct supervision/assist for financial management;Supervision due to cognitive status   Equipment Recommendations  Other (comment) (defer)    Recommendations for Other Services      Precautions / Restrictions Precautions Precautions: Fall Recall of Precautions/Restrictions: Impaired Restrictions Weight Bearing Restrictions Per Provider Order: Yes LLE Weight Bearing Per Provider Order: Weight bearing as tolerated       Mobility Bed Mobility Overal bed mobility: Needs Assistance Bed Mobility: Supine to Sit     Supine to sit: Max assist, +2 for physical assistance          Transfers Overall transfer level: Needs assistance Equipment used: Rolling walker (2 wheels) Transfers: Sit to/from Stand, Bed to chair/wheelchair/BSC Sit to Stand: Mod assist, +2 physical assistance     Step  pivot transfers: Mod assist, +2 physical assistance           Balance Overall balance assessment: Needs assistance Sitting-balance support: Feet supported, Bilateral upper extremity supported Sitting balance-Leahy Scale: Fair     Standing balance support: Bilateral upper extremity supported, During functional activity, Reliant on assistive device for balance Standing balance-Leahy Scale: Poor                             ADL either performed or assessed with clinical judgement   ADL Overall ADL's : Needs assistance/impaired Eating/Feeding: Sitting;Set up Eating/Feeding Details (indicate cue type and reason): set up/CGA for taking sips of water                                        Extremity/Trunk Assessment              Vision       Perception     Praxis     Communication Communication Communication: Impaired Factors Affecting Communication: Hearing impaired   Cognition Arousal: Alert Behavior During Therapy: WFL for tasks assessed/performed                                 Following commands: Impaired Following commands impaired: Follows one step commands inconsistently, Follows one step commands with increased time      Cueing   Cueing Techniques: Verbal cues, Tactile cues  Exercises      Shoulder Instructions       General Comments  (  Pt and son educated on role of acute PT, benefits of OOB, transfer techniques to increase function and reduce pain)    Pertinent Vitals/ Pain       Pain Assessment Pain Assessment: Faces Faces Pain Scale: Hurts even more Pain Location: LLE Pain Descriptors / Indicators: Grimacing, Guarding, Moaning Pain Intervention(s): Premedicated before session, Monitored during session, Limited activity within patient's tolerance, Repositioned  Home Living                                          Prior Functioning/Environment              Frequency  Min 2X/week         Progress Toward Goals  OT Goals(current goals can now be found in the care plan section)  Progress towards OT goals: Progressing toward goals  Acute Rehab OT Goals Patient Stated Goal: get better and walk again OT Goal Formulation: With patient/family Time For Goal Achievement: 05/26/24 Potential to Achieve Goals: Good  Plan      Co-evaluation    PT/OT/SLP Co-Evaluation/Treatment: Yes Reason for Co-Treatment: Complexity of the patient's impairments (multi-system involvement);For patient/therapist safety PT goals addressed during session: Mobility/safety with mobility OT goals addressed during session: ADL's and self-care      AM-PAC OT 6 Clicks Daily Activity     Outcome Measure   Help from another person eating meals?: A Lot Help from another person taking care of personal grooming?: A Lot Help from another person toileting, which includes using toliet, bedpan, or urinal?: Total Help from another person bathing (including washing, rinsing, drying)?: A Lot Help from another person to put on and taking off regular upper body clothing?: A Lot Help from another person to put on and taking off regular lower body clothing?: A Lot 6 Click Score: 11    End of Session Equipment Utilized During Treatment: Gait belt;Rolling walker (2 wheels)  OT Visit Diagnosis: Other abnormalities of gait and mobility (R26.89);Muscle weakness (generalized) (M62.81);History of falling (Z91.81);Pain Pain - Right/Left: Left Pain - part of body: Hip;Leg   Activity Tolerance Patient tolerated treatment well   Patient Left in chair;with call bell/phone within reach;with chair alarm set;with family/visitor present;with nursing/sitter in room   Nurse Communication Other (comment) (bandage on L elbow saturated)        Time: 8875-8847 OT Time Calculation (min): 28 min  Charges: OT General Charges $OT Visit: 1 Visit OT Treatments $Therapeutic Activity: 8-22 mins  Warren SAUNDERS., MPH, MS,  OTR/L ascom [phone] 05/14/2024, 12:54 PM

## 2024-05-14 NOTE — TOC Progression Note (Signed)
 Transition of Care [facility]) - Progression Note    Patient Details  Name: Anna Barrett MRN: [id number] Date of Birth: 04-12-1923  Transition of Care [facility]) CM/SW Contact  Alvaro Louder, [place] Phone Number: 05/14/2024, 12:21 PM  Clinical Narrative:  Patient and family selected SNF [facility]. LCSWA reached out to [employer] to start auth to admit to facility. Awaiting auth.   TOC to follow for discharge                       Expected Discharge Plan and Services                                               Social Drivers of Health (SDOH) Interventions SDOH Screenings   Food Insecurity: No Food Insecurity (05/11/2024)  Housing: Low Risk (05/11/2024)  Transportation Needs: No Transportation Needs (05/11/2024)  Utilities: Not At Risk (05/11/2024)  Financial Resource Strain: Low Risk  (02/11/2023)   Received from [facility]  Social Connections: Moderately Integrated (05/11/2024)  Tobacco Use: Low Risk (05/10/2024)    Readmission Risk Interventions     No data to display

## 2024-05-14 NOTE — Progress Notes (Signed)
 Physical Therapy Treatment Patient Details Name: Anna Barrett MRN: [id number] DOB: 01-08-1923 Today's Date: 05/14/2024   History of Present Illness Patient is a 89 year old female with mechanical fall and hip pain. S/p intramedullary rod left hip for hip fracture. PMH: sCHF with EF 35-40%, HTN,  HLD, CAD, MI, asthma, GERD, CKD-3A, A-fib not on anticoagulants, anemia.    PT Comments  Pt received in bed for co-tx session with OT due to +2 assist for safe transfers and mobility. Pt required MaxA to transition to EOB. Fair static sitting balance with feet supported, no dizziness. Pt stood from slightly raised bed with ModAx2 to RW. Verbal and manual cues to facilitate upright standing and wt shifting onto L LE. ModAx2 to shuffle a few steps to bedside chair due to poor wt acceptance through L LE and assist for RW management. Pt tolerated session better with 5mg  of Oxy on board. Will continue to progress as tolerated.   If plan is discharge home, recommend the following: Two people to help with walking and/or transfers;Two people to help with bathing/dressing/bathroom;Assist for transportation;Help with stairs or ramp for entrance;Assistance with cooking/housework   Can travel by private vehicle     No  Equipment Recommendations  None recommended by PT (TBD, pt has a Rollator at home)    Recommendations for Other Services       Precautions / Restrictions Precautions Precautions: Fall Recall of Precautions/Restrictions: Impaired Restrictions Weight Bearing Restrictions Per Provider Order: Yes LLE Weight Bearing Per Provider Order: Weight bearing as tolerated     Mobility  Bed Mobility Overal bed mobility: Needs Assistance Bed Mobility: Supine to Sit     Supine to sit: Max assist     General bed mobility comments:  (Improved tolerance for bed mobility this date)    Transfers Overall transfer level: Needs assistance Equipment used: Rolling walker (2 wheels) Transfers: Sit to/from  Stand, Bed to chair/wheelchair/BSC Sit to Stand: Mod assist   Step pivot transfers: Mod assist, +2 physical assistance       General transfer comment:  (+2 assist to side shuffle from bed to chair for balance and RW management)    Ambulation/Gait               General Gait Details:  (Unable to tolerate this date due to pain)   Stairs             Wheelchair Mobility     Tilt Bed    Modified Rankin (Stroke Patients Only)       Balance Overall balance assessment: Needs assistance Sitting-balance support: Feet supported, Bilateral upper extremity supported Sitting balance-Leahy Scale: Fair Sitting balance - Comments:  (SBA to maintain static sitting EOB)   Standing balance support: Bilateral upper extremity supported, During functional activity, Reliant on assistive device for balance Standing balance-Leahy Scale: Poor Standing balance comment:  (+2 for safe standing at RW, pt tends to buckle when wt shifting onto L LE)                            Communication Communication Communication: Impaired Factors Affecting Communication: Hearing impaired  Cognition Arousal: Alert Behavior During Therapy: WFL for tasks assessed/performed   PT - Cognitive impairments: History of cognitive impairments, Memory, Sequencing                         Following commands: Impaired Following commands impaired: Follows one step commands  inconsistently, Follows one step commands with increased time    Cueing Cueing Techniques: Verbal cues, Tactile cues  Exercises Total Joint Exercises Ankle Circles/Pumps: AROM, Both, 5 reps Heel Slides: AAROM, Left, 5 reps    General Comments General comments (skin integrity, edema, etc.):  (Pt and son educated on role of acute PT, benefits of OOB, transfer techniques to increase function and reduce pain)      Pertinent Vitals/Pain Pain Assessment Pain Assessment: Faces Faces Pain Scale: Hurts even more Pain  Location: LLE Pain Descriptors / Indicators: Grimacing, Guarding, Moaning Pain Intervention(s): Premedicated before session, Limited activity within patient's tolerance    Home Living                          Prior Function            PT Goals (current goals can now be found in the care plan section) Acute Rehab PT Goals Patient Stated Goal: family goal is SNF Progress towards PT goals: Progressing toward goals    Frequency    7X/week      PT Plan      Co-evaluation PT/OT/SLP Co-Evaluation/Treatment: Yes Reason for Co-Treatment: Complexity of the patient's impairments (multi-system involvement);For patient/therapist safety PT goals addressed during session: Mobility/safety with mobility OT goals addressed during session: ADL's and self-care      AM-PAC PT 6 Clicks Mobility   Outcome Measure  Help needed turning from your back to your side while in a flat bed without using bedrails?: Total Help needed moving from lying on your back to sitting on the side of a flat bed without using bedrails?: Total Help needed moving to and from a bed to a chair (including a wheelchair)?: Total Help needed standing up from a chair using your arms (e.g., wheelchair or bedside chair)?: Total Help needed to walk in hospital room?: Total Help needed climbing 3-5 steps with a railing? : Total 6 Click Score: 6    End of Session Equipment Utilized During Treatment: Gait belt Activity Tolerance: Patient tolerated treatment well;Patient limited by pain Patient left: in chair;with call bell/phone within reach;with family/visitor present;with nursing/sitter in room Nurse Communication: Mobility status PT Visit Diagnosis: Difficulty in walking, not elsewhere classified (R26.2);Pain Pain - Right/Left: Left Pain - part of body: Hip     Time: 8875-8847 PT Time Calculation (min) (ACUTE ONLY): 28 min  Charges:    $Therapeutic Activity: 8-22 mins PT General Charges $$ ACUTE PT  VISIT: 1 Visit                    Darice Bohr, PTA  Darice JAYSON Bohr 05/14/2024, 12:28 PM

## 2024-05-15 DIAGNOSIS — S72002A Fracture of unspecified part of neck of left femur, initial encounter for closed fracture: Secondary | ICD-10-CM | POA: Diagnosis not present

## 2024-05-15 LAB — BASIC METABOLIC PANEL WITH GFR
Anion gap: 6 (ref 5–15)
BUN: 47 mg/dL — ABNORMAL HIGH (ref 8–23)
CO2: 28 mmol/L (ref 22–32)
Calcium: 8.5 mg/dL — ABNORMAL LOW (ref 8.9–10.3)
Chloride: 109 mmol/L (ref 98–111)
Creatinine, Ser: 1.22 mg/dL — ABNORMAL HIGH (ref 0.44–1.00)
GFR, Estimated: 40 mL/min — ABNORMAL LOW
Glucose, Bld: 117 mg/dL — ABNORMAL HIGH (ref 70–99)
Potassium: 4.6 mmol/L (ref 3.5–5.1)
Sodium: 143 mmol/L (ref 135–145)

## 2024-05-15 MED ORDER — AMLODIPINE BESYLATE 5 MG PO TABS
2.5000 mg | ORAL_TABLET | Freq: Every day | ORAL | Status: DC
  Administered 2024-05-15 – 2024-05-17 (×3): 2.5 mg via ORAL
  Filled 2024-05-15: qty 1
  Filled 2024-05-15: qty 0.5
  Filled 2024-05-15 (×2): qty 1

## 2024-05-15 MED ORDER — ACETAMINOPHEN 500 MG PO TABS
1000.0000 mg | ORAL_TABLET | Freq: Three times a day (TID) | ORAL | Status: DC
  Administered 2024-05-15 – 2024-05-17 (×6): 1000 mg via ORAL
  Filled 2024-05-15 (×6): qty 2

## 2024-05-15 MED ORDER — ACETAMINOPHEN 500 MG PO TABS: 1000.0000 mg | ORAL_TABLET | Freq: Three times a day (TID) | ORAL | Status: AC

## 2024-05-15 MED ORDER — OXYCODONE HCL 5 MG PO TABS
2.5000 mg | ORAL_TABLET | Freq: Four times a day (QID) | ORAL | Status: DC | PRN
  Administered 2024-05-15 – 2024-05-17 (×2): 2.5 mg via ORAL
  Filled 2024-05-15 (×2): qty 1

## 2024-05-15 MED ORDER — OXYCODONE HCL 5 MG PO TABS: 2.5000 mg | ORAL_TABLET | Freq: Three times a day (TID) | ORAL | 0 refills | Status: DC | PRN

## 2024-05-15 MED ORDER — OXYCODONE HCL 5 MG PO TABS: 2.5000 mg | ORAL_TABLET | Freq: Four times a day (QID) | ORAL | Status: DC | PRN

## 2024-05-15 NOTE — Plan of Care (Signed)
   Problem: Clinical Measurements: Goal: Will remain free from infection Outcome: Progressing   Problem: Clinical Measurements: Goal: Diagnostic test results will improve Outcome: Progressing   Problem: Activity: Goal: Risk for activity intolerance will decrease Outcome: Progressing   Problem: Nutrition: Goal: Adequate nutrition will be maintained Outcome: Progressing

## 2024-05-15 NOTE — Plan of Care (Signed)

## 2024-05-15 NOTE — Progress Notes (Signed)
 Physical Therapy Treatment Patient Details Name: Anna Barrett MRN: [id number] DOB: [birth year] Today's Date: 05/15/2024   History of Present Illness Patient is a 89 year old female with mechanical fall and hip pain. S/p intramedullary rod left hip for hip fracture. PMH: sCHF with EF 35-40%, HTN,  HLD, CAD, MI, asthma, GERD, CKD-3A, A-fib not on anticoagulants, anemia.    PT Comments  Patient found supine in bed with grandaughter present in room. Patient denies pain upon beginning of session. Patient completed bed mobility performing supine to EOB with assistance at mod a requiring cuing for hand and foot placement requiring extended time to follow through with commands. Upon attempting to perform STS from EOB patient requiring min a x 2 with verbal cuing for hand placement and increasing anterior WS upon ascending with patient presenting with consistent bouts of knee buckling in LLE requiring frequent cuing to increase active L knee extension. Patient performing marching in place with patient displaying ability to clear BLE from floor upon performing but requiring mod A to maintain static standing position due to excessive retrograde leaning. Patient guided through SPT to recliner towards patients right side at [facility] a x 2 with improvement in reduction of knee buckling in LLE upon pivoting. Patient completed seated ther-ex with emphasis towards LLE consisting of LAQ, marches, and AAROM hip abduction with patient tolerating well, but does report increase in achy sensation of LLE. Patient left seated in recliner in room with BLE elevated with grandaughter present. Patient call bell, remote, and tray table within reach at end of session.                Precautions / Restrictions Precautions Precautions: Fall Recall of Precautions/Restrictions: Impaired Restrictions LLE Weight Bearing Per Provider Order: Weight bearing as tolerated     Mobility  Bed Mobility Overal bed mobility: Needs Assistance Bed  Mobility: Supine to Sit     Supine to sit: Mod assist          Transfers Overall transfer level: Needs assistance Equipment used: Rolling walker (2 wheels) Transfers: Sit to/from Stand, Bed to chair/wheelchair/BSC Sit to Stand: Min assist, +2 physical assistance   Step pivot transfers: Min assist, +2 physical assistance       General transfer comment: Inconsistent knee buckling during SPT and STS in LLE           Communication Communication Communication: Impaired Factors Affecting Communication: Hearing impaired  Cognition Arousal: Alert Behavior During Therapy: WFL for tasks assessed/performed   PT - Cognitive impairments: History of cognitive impairments, Memory, Sequencing                       PT - Cognition Comments: Patient appears to be alert during session Following commands: Intact Following commands impaired: Follows one step commands inconsistently    Cueing Cueing Techniques: Verbal cues, Tactile cues  Exercises Total Joint Exercises Hip ABduction/ADduction: AAROM, Left, 10 reps Long Arc Quad: AROM, 10 reps, Left Marching in Standing: 5 reps, Both    General Comments        Pertinent Vitals/Pain Pain Assessment Pain Assessment: No/denies pain Pain Location: LLE Pain Descriptors / Indicators: Aching Pain Intervention(s): Premedicated before session     PT Goals (current goals can now be found in the care plan section) Acute Rehab PT Goals Patient Stated Goal: family goal is SNF PT Goal Formulation: With family Potential to Achieve Goals: Fair Progress towards PT goals: Progressing toward goals    Frequency  7X/WEEK        AM-PAC PT 6 Clicks Mobility   Outcome Measure  Help needed turning from your back to your side while in a flat bed without using bedrails?: A Lot Help needed moving from lying on your back to sitting on the side of a flat bed without using bedrails?: A Lot Help needed moving to and from a bed to  a chair (including a wheelchair)?: A Lot Help needed standing up from a chair using your arms (e.g., wheelchair or bedside chair)?: A Lot Help needed to walk in hospital room?: A Lot   6 Click Score: 10         Time: 1355-1415 PT Time Calculation (min) (ACUTE ONLY): 20 min  Charges:    $Therapeutic Exercise: 8-22 mins PT General Charges $$ ACUTE PT VISIT: 1 Visit                     Waddell Lesches, PTA

## 2024-05-15 NOTE — Progress Notes (Signed)
" ° °  Subjective: 4 Days Post-Op Procedures (LRB): FIXATION, FRACTURE, INTERTROCHANTERIC, WITH INTRAMEDULLARY ROD (Left) Patient doing well this am with no complaints. Son at bedside, Altered mental status last night that resolved. 5 mg oxy given x 2. + BM Plan is to go Skilled nursing facility after hospital stay.  Objective: Vital signs in last 24 hours: Temp:  [97.5 F (36.4 C)-98.2 F (36.8 C)] 97.8 F (36.6 C) (01/03 0825) Pulse Rate:  [79-93] 88 (01/03 0825) Resp:  [15-18] 15 (01/03 0825) BP: (106-150)/(48-65) 150/60 (01/03 0825) SpO2:  [89 %-99 %] 99 % (01/03 0825) Weight:  [45.4 kg] 45.4 kg (01/03 0459)  Intake/Output from previous day: 01/02 0701 - 01/03 0700 In: 400 [P.O.:400] Out: 200 [Urine:200] Intake/Output this shift: Total I/O In: 120 [P.O.:120] Out: -   Recent Labs    05/13/24 0017 05/13/24 1126 05/13/24 1406 05/14/24 1134  HGB 7.2* 8.6* 9.5* 8.9*   Recent Labs    05/13/24 1406 05/14/24 1134  WBC 7.9 6.5  RBC 3.09* 2.92*  HCT 28.3* 28.0*  PLT 156 153   Recent Labs    05/14/24 0503 05/15/24 0457  NA 145 143  K 4.2 4.6  CL 111 109  CO2 28 28  BUN 52* 47*  CREATININE 1.38* 1.22*  GLUCOSE 103* 117*  CALCIUM 8.1* 8.5*   No results for input(s): LABPT, INR in the last 72 hours.   EXAM General - Patient is alert and oriented x 2 Extremity - Neurovascular intact Intact pulses distally Thigh is soft, no swelling or ecchymosis. Negative Homans' sign bilaterally Dressing - dressing C/D/I and no drainage Motor Function - intact, moving foot and toes well on exam.   Past Medical History:  Diagnosis Date   Asthma    Carotid stenosis    Headache    MIGRAINES   History of MI (myocardial infarction)    Hyperglycemia    Hyperlipidemia    Hypertension    Ischemic cardiomyopathy    Myocardial infarction (HCC) 1962   Osteoporosis    Parathyroid adenoma     Assessment/Plan:   4 Days Post-Op Procedures (LRB): FIXATION, FRACTURE,  INTERTROCHANTERIC, WITH INTRAMEDULLARY ROD (Left) Principal Problem:   Closed left hip fracture (HCC) Active Problems:   Hypertension   Asthma, chronic   CAD (coronary artery disease)   Myocardial injury   Chronic kidney disease, stage 3a (HCC)   Atrial fibrillation, chronic (HCC)   Protein-calorie malnutrition, severe   Fall at home, initial encounter   Chronic combined systolic and diastolic CHF (congestive heart failure) (HCC)   Acute blood loss anemia  Estimated body mass index is 20.22 kg/m as calculated from the following:   Height as of this encounter: 4' 11 (1.499 m).   Weight as of this encounter: 45.4 kg. Advance diet Up with therapy Pain well controlled. Will schedule Tylenol  q 8 hrs. Reduce oxycodone  to 2.5 mg PRN severe pain VSS + BM CM to assist with discharge to SNF  Follow up with [facility] in 2 weeeks Lovenox  30 mg SQ daily x 2 weeks at discharge  DVT Prophylaxis - Lovenox , TED hose, and SCDs Weight-Bearing as tolerated to left leg   T. Medford Amber, PA-C [facility] 05/15/2024, 11:00 AM   "

## 2024-05-15 NOTE — Progress Notes (Signed)
 " PROGRESS NOTE    Anna Barrett  FMW:[id number] DOB: 1923/02/22 DOA: 05/10/2024 PCP: Lenon Layman ORN, MD  Chief Complaint  Patient presents with   [facility] Course:  Anna Barrett is a 89 y.o. female with medical history significant of  sCHF with EF 35-40%, HTN,  HLD, CAD, MI, asthma, GERD, CKD-3A, A-fib not on anticoagulants, anemia, who presents with mechanical fall and left hip pain.    Per his son and granddaughter at the bedside, patient fell accidentally when she was walking at home round 10:00 p.m.  No LOC.  Her granddaughter is very sure that the patient did not have head or neck injury.  Family does not want to do CT scan of the head and neck.  Patient developed left hip pain, which is constant, severe, sharp, nonradiating, aggravated by movement.  Left leg is shortened and externally rotated.  No chest pain, cough, SOB.  Patient has nausea, no vomiting, diarrhea or abdominal pain.  No symptoms of UTI.  Workup revealed intertrochanteric fracture of the left hip, hospital course as below  Subjective:  Awake and alert. Not complaining of pain. Tolerating diet. Bm last night   Objective: Vitals:   05/14/24 1936 05/15/24 0411 05/15/24 0459 05/15/24 0825  BP: 116/65 (!) 145/53  (!) 150/60  Pulse: 93 88  88  Resp: 17 18  15   Temp: 98.1 F (36.7 C) 98.2 F (36.8 C)  97.8 F (36.6 C)  TempSrc:      SpO2: 96% (!) 89%  99%  Weight:   45.4 kg   Height:        Intake/Output Summary (Last 24 hours) at 05/15/2024 1357 Last data filed at 05/15/2024 1300 Gross per 24 hour  Intake 270 ml  Output 50 ml  Net 220 ml   Filed Weights   05/13/24 0712 05/14/24 0714 05/15/24 0459  Weight: 44.2 kg 44.5 kg 45.4 kg    Examination: General: Not in acute distress Cardiac: S1/S2, RRR, No murmurs,  Respiratory: No rales, wheezing, rhonchi or rubs GI: Soft, nondistended, nontender  Ext: No pitting leg edema bilaterally.   Skin: No rashes. Dressing left lateral hip  c/d/i Neuro: alert, moving all 4    Assessment & Plan:  Closed left hip fracture (HCC) X-ray showed intertrochanteric fracture of the left hip S/p left hip cephalomedullary nailing 12/30 Pain management with Tylenol , low-dose opioids PT/OT advising snf, toc engaged, has bed, auth pending  Constipation Resolved, bm last night - continue bowel regimen  Acute urinary retention Foley discontinued pod2, retained, now resolved - monitor  Mild hyperkalemia Lokelma  1 dose, resolved  Acute blood loss anemia 2/2 fracture and surgery. Hgb trended to 7s, transfused 1 unit, appropriate response after that  Aki on Chronic kidney disease, stage 3a [facility]):  Resolved Home torsemide  on hold   Chronic combined systolic and diastolic CHF (congestive heart failure) (HCC) 2D echo on 10/30/2023 showed EF of 35-40% with grade 1 diastolic dysfunction. CHF seems to be compensated. Home torsemide  on hold, can likely resume in the next day or two if po remains good   CAD (coronary artery disease) and myocardial injury:  Troponin 27>29, no chest pain.  Likely demand ischemia. Patient stopped taking aspirin  Continue Coreg   Hypertension Mild elevation today Continue Coreg  Resumed home amlodipine     Asthma, chronic: Stable Bronchodilators, and prn Mucinex   Atrial fibrillation, chronic (HCC):  Patient is not taking anticoagulants. -Continue Coreg  3 125 mg twice daily   Protein-calorie malnutrition,  severe: Body weight 38.4 kg, BMI 19.6 - Ensure - Nutrition consult   DVT prophylaxis: lovenox    Code Status: Limited: Do not attempt resuscitation (DNR) -DNR-LIMITED -Do Not Intubate/DNI  Disposition: snf Communication: niece updated @ bedside 1/3  Consultants:  Treatment Team:  Consulting Physician: Ezra Jackquline RAMAN, MD Cardiology  Procedures:  S/p left hip cephalomedullary nailing 12/30  Antimicrobials:  Anti-infectives (From admission, onward)    Start     Dose/Rate Route Frequency  Ordered Stop   05/11/24 1400  ceFAZolin  (ANCEF ) IVPB 2g/100 mL premix        2 g 200 mL/hr over 30 Minutes Intravenous Every 8 hours 05/11/24 0937 05/12/24 0604   05/10/24 2330  azithromycin  (ZITHROMAX ) 500 mg in sodium chloride  0.9 % 250 mL IVPB  Status:  Discontinued        500 mg 250 mL/hr over 60 Minutes Intravenous  Once 05/10/24 2316 05/10/24 2316   05/10/24 2330  cefTRIAXone  (ROCEPHIN ) 1 g in sodium chloride  0.9 % 100 mL IVPB  Status:  Discontinued        1 g 200 mL/hr over 30 Minutes Intravenous  Once 05/10/24 2316 05/10/24 2316       Data Reviewed: I have personally reviewed following labs and imaging studies CBC: Recent Labs  Lab 05/10/24 2237 05/11/24 0829 05/12/24 0605 05/13/24 0017 05/13/24 1126 05/13/24 1406 05/14/24 1134  WBC 12.4*   < > 6.6 5.5 6.6 7.9 6.5  NEUTROABS 9.5*  --   --   --   --   --   --   HGB 12.5   < > 8.2* 7.2* 8.6* 9.5* 8.9*  HCT 38.4   < > 24.8* 22.5* 25.4* 28.3* 28.0*  MCV 93.0   < > 92.9 94.9 92.0 91.6 95.9  PLT 243   < > 166 148* 135* 156 153   < > = values in this interval not displayed.   Basic Metabolic Panel: Recent Labs  Lab 05/11/24 0829 05/12/24 0605 05/13/24 1126 05/14/24 0503 05/15/24 0457  NA 141 141 142 145 143  K 5.3* 4.6 4.1 4.2 4.6  CL 102 104 107 111 109  CO2 31 29 29 28 28   GLUCOSE 167* 156* 119* 103* 117*  BUN 51* 49* 57* 52* 47*  CREATININE 1.48* 1.86* 1.75* 1.38* 1.22*  CALCIUM 9.2 8.5* 8.1* 8.1* 8.5*   GFR: Estimated Creatinine Clearance: 17.1 mL/min (A) (by C-G formula based on SCr of 1.22 mg/dL (H)). Liver Function Tests: Recent Labs  Lab 05/10/24 2237  AST 14*  ALT 17  ALKPHOS 76  BILITOT 0.4  PROT 7.1  ALBUMIN 3.8   CBG: No results for input(s): GLUCAP in the last 168 hours.  No results found for this or any previous visit (from the past 240 hours).   Radiology Studies: No results found.   Scheduled Meds:  sodium chloride    Intravenous Once   acetaminophen   1,000 mg Oral Q8H    amLODipine   2.5 mg Oral Daily   carvedilol   3.125 mg Oral BID WC   Chlorhexidine  Gluconate Cloth  6 each Topical Daily   cholecalciferol   1,000 Units Oral BID   enoxaparin  (LOVENOX ) injection  30 mg Subcutaneous Q24H   feeding supplement (GLUCERNA SHAKE)  237 mL Oral TID BM   fluticasone  furoate-vilanterol  1 puff Inhalation Daily   lidocaine   1 patch Transdermal Q24H   multivitamin with minerals  1 tablet Oral Daily   pantoprazole   40 mg Oral Daily   polyethylene glycol  34 g Oral Daily   senna  1 tablet Oral BID   Continuous Infusions:     LOS: 5 days   Devaughn KATHEE Ban, MD [facility]  To contact the attending physician between 7A-7P please use Epic Chat. To contact the covering physician during after hours 7P-7A, please review Amion.  05/15/2024, 1:57 PM      "

## 2024-05-16 DIAGNOSIS — S72002A Fracture of unspecified part of neck of left femur, initial encounter for closed fracture: Secondary | ICD-10-CM | POA: Diagnosis not present

## 2024-05-16 NOTE — Plan of Care (Signed)

## 2024-05-16 NOTE — Progress Notes (Addendum)
 " PROGRESS NOTE    Anna Barrett  FMW:[id number] DOB: 12/08/1922 DOA: 05/10/2024 PCP: Lenon Layman ORN, MD  Chief Complaint  Patient presents with   [facility] Course:  Anna Barrett is a 89 y.o. female with medical history significant of  sCHF with EF 35-40%, HTN,  HLD, CAD, MI, asthma, GERD, CKD-3A, A-fib not on anticoagulants, anemia, who presents with mechanical fall and left hip pain.    Per his son and granddaughter at the bedside, patient fell accidentally when she was walking at home round 10:00 p.m.  No LOC.  Her granddaughter is very sure that the patient did not have head or neck injury.  Family does not want to do CT scan of the head and neck.  Patient developed left hip pain, which is constant, severe, sharp, nonradiating, aggravated by movement.  Left leg is shortened and externally rotated.  No chest pain, cough, SOB.  Patient has nausea, no vomiting, diarrhea or abdominal pain.  No symptoms of UTI.  Workup revealed intertrochanteric fracture of the left hip, hospital course as below  Subjective:  Awake and alert. Not complaining of pain. Tolerating diet. More lethargic today   Objective: Vitals:   05/15/24 1805 05/15/24 2206 05/16/24 0315 05/16/24 0826  BP: (!) 169/52 (!) 143/52 (!) 120/54 (!) 114/38  Pulse:  84 84 78  Resp: 18 18 18 15   Temp:  98 F (36.7 C) 97.7 F (36.5 C) 97.8 F (36.6 C)  TempSrc:   Oral   SpO2:  93% 92% 93%  Weight:      Height:        Intake/Output Summary (Last 24 hours) at 05/16/2024 1337 Last data filed at 05/15/2024 2020 Gross per 24 hour  Intake 200 ml  Output --  Net 200 ml   Filed Weights   05/13/24 0712 05/14/24 0714 05/15/24 0459  Weight: 44.2 kg 44.5 kg 45.4 kg    Examination: General: Not in acute distress Cardiac: S1/S2, RRR, No murmurs,  Respiratory: No rales, wheezing, rhonchi or rubs GI: Soft, nondistended, nontender  Ext: No pitting leg edema bilaterally.   Skin: No rashes. Dressing left lateral hip  c/d/i Neuro: alert, moving all 4, symmetric power  Assessment & Plan:  Closed left hip fracture (HCC) X-ray showed intertrochanteric fracture of the left hip S/p left hip cephalomedullary nailing 12/30 Pain management with Tylenol , low-dose opioids PT/OT advising snf, toc engaged, has bed, auth pending  Delirium Intermittent episodes here, today appears to have hypoactive delirium. Non-focal neuro exam, normal vitals.  - monitor - delirium precautions  Constipation Resolved, bm evening 1/2 - continue bowel regimen  Acute urinary retention Foley discontinued pod2, retained, now resolved - monitor  Mild hyperkalemia Lokelma  1 dose, resolved  Acute blood loss anemia 2/2 fracture and surgery. Hgb trended to 7s, transfused 1 unit, appropriate response after that  Aki on Chronic kidney disease, stage 3a [facility]):  Resolved Home torsemide  on hold   Chronic combined systolic and diastolic CHF (congestive heart failure) (HCC) 2D echo on 10/30/2023 showed EF of 35-40% with grade 1 diastolic dysfunction. CHF seems to be compensated. Home torsemide  on hold, can likely resume in the next day or two if po remains good   CAD (coronary artery disease) and myocardial injury:  Troponin 27>29, no chest pain.  Likely demand ischemia. Patient stopped taking aspirin  Continue Coreg   Hypertension Mild elevation today Continue Coreg  Resumed home amlodipine     Asthma, chronic: Stable Bronchodilators, and prn Mucinex   Atrial fibrillation, chronic (HCC):  Patient is not taking anticoagulants. -Continue Coreg  3 125 mg twice daily   Protein-calorie malnutrition, severe: Body weight 38.4 kg, BMI 19.6 - Ensure - Nutrition consult   DVT prophylaxis: lovenox    Code Status: Limited: Do not attempt resuscitation (DNR) -DNR-LIMITED -Do Not Intubate/DNI  Disposition: snf Communication: niece updated @ bedside 1/4 and son telephonically 1/4  Consultants:  Treatment Team:  Consulting Physician:  Ezra Jackquline RAMAN, MD Cardiology  Procedures:  S/p left hip cephalomedullary nailing 12/30  Antimicrobials:  Anti-infectives (From admission, onward)    Start     Dose/Rate Route Frequency Ordered Stop   05/11/24 1400  ceFAZolin  (ANCEF ) IVPB 2g/100 mL premix        2 g 200 mL/hr over 30 Minutes Intravenous Every 8 hours 05/11/24 0937 05/12/24 0604   05/10/24 2330  azithromycin  (ZITHROMAX ) 500 mg in sodium chloride  0.9 % 250 mL IVPB  Status:  Discontinued        500 mg 250 mL/hr over 60 Minutes Intravenous  Once 05/10/24 2316 05/10/24 2316   05/10/24 2330  cefTRIAXone  (ROCEPHIN ) 1 g in sodium chloride  0.9 % 100 mL IVPB  Status:  Discontinued        1 g 200 mL/hr over 30 Minutes Intravenous  Once 05/10/24 2316 05/10/24 2316       Data Reviewed: I have personally reviewed following labs and imaging studies CBC: Recent Labs  Lab 05/10/24 2237 05/11/24 0829 05/12/24 0605 05/13/24 0017 05/13/24 1126 05/13/24 1406 05/14/24 1134  WBC 12.4*   < > 6.6 5.5 6.6 7.9 6.5  NEUTROABS 9.5*  --   --   --   --   --   --   HGB 12.5   < > 8.2* 7.2* 8.6* 9.5* 8.9*  HCT 38.4   < > 24.8* 22.5* 25.4* 28.3* 28.0*  MCV 93.0   < > 92.9 94.9 92.0 91.6 95.9  PLT 243   < > 166 148* 135* 156 153   < > = values in this interval not displayed.   Basic Metabolic Panel: Recent Labs  Lab 05/11/24 0829 05/12/24 0605 05/13/24 1126 05/14/24 0503 05/15/24 0457  NA 141 141 142 145 143  K 5.3* 4.6 4.1 4.2 4.6  CL 102 104 107 111 109  CO2 31 29 29 28 28   GLUCOSE 167* 156* 119* 103* 117*  BUN 51* 49* 57* 52* 47*  CREATININE 1.48* 1.86* 1.75* 1.38* 1.22*  CALCIUM 9.2 8.5* 8.1* 8.1* 8.5*   GFR: Estimated Creatinine Clearance: 17.1 mL/min (A) (by C-G formula based on SCr of 1.22 mg/dL (H)). Liver Function Tests: Recent Labs  Lab 05/10/24 2237  AST 14*  ALT 17  ALKPHOS 76  BILITOT 0.4  PROT 7.1  ALBUMIN 3.8   CBG: No results for input(s): GLUCAP in the last 168 hours.  No results found  for this or any previous visit (from the past 240 hours).   Radiology Studies: No results found.   Scheduled Meds:  sodium chloride    Intravenous Once   acetaminophen   1,000 mg Oral Q8H   amLODipine   2.5 mg Oral Daily   carvedilol   3.125 mg Oral BID WC   Chlorhexidine  Gluconate Cloth  6 each Topical Daily   cholecalciferol   1,000 Units Oral BID   enoxaparin  (LOVENOX ) injection  30 mg Subcutaneous Q24H   feeding supplement (GLUCERNA SHAKE)  237 mL Oral TID BM   fluticasone  furoate-vilanterol  1 puff Inhalation Daily   lidocaine   1  patch Transdermal Q24H   multivitamin with minerals  1 tablet Oral Daily   pantoprazole   40 mg Oral Daily   polyethylene glycol  34 g Oral Daily   senna  1 tablet Oral BID   Continuous Infusions:     LOS: 6 days   Devaughn KATHEE Ban, MD [facility]  To contact the attending physician between 7A-7P please use Epic Chat. To contact the covering physician during after hours 7P-7A, please review Amion.  05/16/2024, 1:37 PM      "

## 2024-05-16 NOTE — Progress Notes (Signed)
 PT Cancellation Note  Patient Details Name: Anna Barrett MRN: [id number] DOB: 1923-03-01   Cancelled Treatment:     PT attempt 3 times this date. Pt much more lethargic in presentation than previous date. Softer BP overall. Elected to hold session for today and will resume PT tomorrow.    Rankin KATHEE Essex 05/16/2024, 2:42 PM

## 2024-05-17 LAB — CBC
HCT: 30 % — ABNORMAL LOW (ref 36.0–46.0)
Hemoglobin: 9.6 g/dL — ABNORMAL LOW (ref 12.0–15.0)
MCH: 30.8 pg (ref 26.0–34.0)
MCHC: 32 g/dL (ref 30.0–36.0)
MCV: 96.2 fL (ref 80.0–100.0)
Platelets: 243 K/uL (ref 150–400)
RBC: 3.12 MIL/uL — ABNORMAL LOW (ref 3.87–5.11)
RDW: 13.8 % (ref 11.5–15.5)
WBC: 5.7 K/uL (ref 4.0–10.5)
nRBC: 0 % (ref 0.0–0.2)

## 2024-05-17 LAB — BASIC METABOLIC PANEL WITH GFR
Anion gap: 5 (ref 5–15)
BUN: 42 mg/dL — ABNORMAL HIGH (ref 8–23)
CO2: 28 mmol/L (ref 22–32)
Calcium: 8.7 mg/dL — ABNORMAL LOW (ref 8.9–10.3)
Chloride: 106 mmol/L (ref 98–111)
Creatinine, Ser: 1.01 mg/dL — ABNORMAL HIGH (ref 0.44–1.00)
GFR, Estimated: 50 mL/min — ABNORMAL LOW
Glucose, Bld: 106 mg/dL — ABNORMAL HIGH (ref 70–99)
Potassium: 5 mmol/L (ref 3.5–5.1)
Sodium: 140 mmol/L (ref 135–145)

## 2024-05-17 MED ORDER — POLYETHYLENE GLYCOL 3350 17 G PO PACK: 34.0000 g | PACK | Freq: Every day | ORAL | Status: DC

## 2024-05-17 NOTE — Progress Notes (Signed)
 Occupational Therapy Treatment Patient Details Name: Anna Barrett MRN: [id number] DOB: Dec 17, 1922 Today's Date: 05/17/2024   History of present illness Patient is a 89 year old female with mechanical fall and hip pain. S/p intramedullary rod left hip for hip fracture. PMH: sCHF with EF 35-40%, HTN,  HLD, CAD, MI, asthma, GERD, CKD-3A, A-fib not on anticoagulants, anemia.   OT comments  Ms Gatchell was seen for OT treatment on this date. Upon arrival to room pt seated in chair, agreeable to tx. Pt requires MOD A + RW sit<>stand x2, cues for hand placement. MIN A + RW ~3 steps forward/backward. SETUP seated grooming tasks. Reviewed HEP. Pt making good progress toward goals, will continue to follow POC. Discharge recommendation remains appropriate.       If plan is discharge home, recommend the following:  Two people to help with walking and/or transfers;Two people to help with bathing/dressing/bathroom;Assistance with cooking/housework;Assist for transportation;Help with stairs or ramp for entrance;Assistance with feeding;Direct supervision/assist for medications management;Direct supervision/assist for financial management;Supervision due to cognitive status   Equipment Recommendations  Other (comment) (defer)    Recommendations for Other Services      Precautions / Restrictions Precautions Precautions: Fall Recall of Precautions/Restrictions: Impaired Restrictions Weight Bearing Restrictions Per Provider Order: Yes LLE Weight Bearing Per Provider Order: Weight bearing as tolerated       Mobility Bed Mobility               General bed mobility comments: not tested    Transfers Overall transfer level: Needs assistance Equipment used: Rolling walker (2 wheels) Transfers: Sit to/from Stand, Bed to chair/wheelchair/BSC Sit to Stand: Mod assist     Step pivot transfers: Min assist           Balance Overall balance assessment: Needs assistance Sitting-balance support:  No upper extremity supported, Feet supported Sitting balance-Leahy Scale: Fair     Standing balance support: Bilateral upper extremity supported, During functional activity, Reliant on assistive device for balance Standing balance-Leahy Scale: Poor                             ADL either performed or assessed with clinical judgement   ADL Overall ADL's : Needs assistance/impaired                                       General ADL Comments: MIN A + RW for simulated BSC t/f. SETUP seated grooming tasks.     Praxis     Communication Communication Communication: Impaired Factors Affecting Communication: Hearing impaired   Cognition Arousal: Alert Behavior During Therapy: WFL for tasks assessed/performed Cognition: No apparent impairments             OT - Cognition Comments: age approriate                 Following commands: Intact                 General Comments      Pertinent Vitals/ Pain       Pain Assessment Pain Assessment: Faces Faces Pain Scale: Hurts little more Pain Location: LLE Pain Descriptors / Indicators: Aching Pain Intervention(s): Limited activity within patient's tolerance, Repositioned   Frequency  Min 2X/week        Progress Toward Goals  OT Goals(current goals can now be found in the care plan  section)  Progress towards OT goals: Progressing toward goals  Acute Rehab OT Goals OT Goal Formulation: With patient/family Time For Goal Achievement: 05/26/24 Potential to Achieve Goals: Good ADL Goals Pt Will Perform Eating: with modified independence;sitting Pt Will Perform Grooming: sitting;with set-up;with supervision Pt Will Transfer to Toilet: with max assist;stand pivot transfer;bedside commode  Plan      Co-evaluation                 AM-PAC OT 6 Clicks Daily Activity     Outcome Measure   Help from another person eating meals?: A Little Help from another person taking care of  personal grooming?: A Little Help from another person toileting, which includes using toliet, bedpan, or urinal?: A Lot Help from another person bathing (including washing, rinsing, drying)?: A Lot Help from another person to put on and taking off regular upper body clothing?: A Little Help from another person to put on and taking off regular lower body clothing?: A Lot 6 Click Score: 15    End of Session Equipment Utilized During Treatment: Gait belt;Rolling walker (2 wheels)  OT Visit Diagnosis: Other abnormalities of gait and mobility (R26.89);Muscle weakness (generalized) (M62.81);History of falling (Z91.81)   Activity Tolerance Patient tolerated treatment well   Patient Left in chair;with call bell/phone within reach;with nursing/sitter in room;with family/visitor present   Nurse Communication          Time: 1000-1023 OT Time Calculation (min): 23 min  Charges: OT General Charges $OT Visit: 1 Visit OT Treatments $Self Care/Home Management : 23-37 mins  Elston Slot, M.S. OTR/L  05/17/2024, 10:32 AM  ascom [phone]

## 2024-05-17 NOTE — Progress Notes (Signed)
 Pt received up in chair, grimacing with 8/10 L hip pain with Tylenol  on board. Also c/o L elbow pain. Pt assisted back to bed with Mod/MaxA for transfer and balance while taking a few shuffling steps chair>bed. Very uncomfortable and tearful once in bed due to pain, nursing in to give Oxy which seems to help. Pt awaiting transport to STR shortly.   05/17/24 1120  PT Visit Information  Assistance Needed +1  History of Present Illness Patient is a 89 year old female with mechanical fall and hip pain. S/p intramedullary rod left hip for hip fracture. PMH: sCHF with EF 35-40%, HTN,  HLD, CAD, MI, asthma, GERD, CKD-3A, A-fib not on anticoagulants, anemia.  Subjective Data  Patient Stated Goal family goal is SNF  Precautions  Precautions Fall  Recall of Precautions/Restrictions Impaired  Restrictions  Weight Bearing Restrictions Per Provider Order Yes  LLE Weight Bearing Per Provider Order WBAT  Pain Assessment  Pain Assessment Faces  Faces Pain Scale 8  Pain Location LLE  Pain Descriptors / Indicators Aching;Crying;Grimacing;Guarding  Pain Intervention(s) Limited activity within patient's tolerance;Patient requesting pain meds-RN notified;Ice applied  Cognition  Arousal Alert  Behavior During Therapy WFL for tasks assessed/performed  PT - Cognitive impairments History of cognitive impairments;Memory;Sequencing  Following Commands  Following commands Intact  Following commands impaired Follows one step commands inconsistently  Cueing  Cueing Techniques Verbal cues;Tactile cues  Communication  Communication Impaired  Factors Affecting Communication Hearing impaired  Bed Mobility  Overal bed mobility Needs Assistance  Bed Mobility Sit to Supine  Sit to supine Total assist  Transfers  Overall transfer level Needs assistance  Equipment used Rolling walker (2 wheels)  Transfers Sit to/from Stand;Bed to chair/wheelchair/BSC  Sit to Stand Mod assist;Max assist  Bed to/from  chair/wheelchair/BSC transfer type: Step pivot  Step pivot transfers Mod assist  General transfer comment  (Increased support due to 8/10 L hip pain)  Balance  Overall balance assessment Needs assistance  Sitting-balance support No upper extremity supported;Feet supported  Sitting balance-Leahy Scale Fair  Standing balance support Bilateral upper extremity supported;During functional activity;Reliant on assistive device for balance  Standing balance-Leahy Scale Poor  Standing balance comment Static standing at RW with ModA  General Comments  General comments (skin integrity, edema, etc.)  (L proximal honey comb dressing with noteable drainage)  Exercises  Exercises General Lower Extremity  Total Joint Exercises  Ankle Circles/Pumps AROM;Both;5 reps  Long [employer] AROM;10 reps;Left  Other Exercises  Other Exercises  (Pt and son educated on transfers, safe mobility and importance of pain control)  PT - End of Session  Equipment Utilized During Treatment Gait belt  Activity Tolerance Patient limited by pain  Patient left in bed;with call bell/phone within reach;with family/visitor present  Nurse Communication Mobility status;Patient requests pain meds   PT - Assessment/Plan  PT Visit Diagnosis Difficulty in walking, not elsewhere classified (R26.2);Pain  Pain - Right/Left Left  Pain - part of body Hip  PT Frequency (ACUTE ONLY) 7X/week  Follow Up Recommendations Skilled nursing-short term rehab (<3 hours/day)  Can patient physically be transported by private vehicle No  Patient can return home with the following Two people to help with walking and/or transfers;Two people to help with bathing/dressing/bathroom;Assist for transportation;Help with stairs or ramp for entrance;Assistance with cooking/housework  PT equipment None recommended by PT  AM-PAC PT 6 Clicks Mobility Outcome Measure (Version 2)  Help needed turning from your back to your side while in a flat bed without using  bedrails?  2  Help needed moving from lying on your back to sitting on the side of a flat bed without using bedrails? 2  Help needed moving to and from a bed to a chair (including a wheelchair)? 2  Help needed standing up from a chair using your arms (e.g., wheelchair or bedside chair)? 2  Help needed to walk in hospital room? 2  Help needed climbing 3-5 steps with a railing?  1  6 Click Score 11  Consider Recommendation of Discharge To: CIR/SNF/LTACH  Progressive Mobility  What is the highest level of mobility based on the mobility assessment? Level 3 (Stands with assistance) - Balance while standing  and cannot march in place  Activity Stood at bedside;Pivoted/transferred from chair to bed  PT Time Calculation  PT Start Time (ACUTE ONLY) 1010  PT Stop Time (ACUTE ONLY) 1026  PT Time Calculation (min) (ACUTE ONLY) 16 min  PT General Charges  $$ ACUTE PT VISIT 1 Visit  PT Treatments  $Therapeutic Activity 8-22 mins  Darice Bohr, PTA 05/17/2024

## 2024-05-17 NOTE — Discharge Summary (Signed)
 Anna Barrett FMW:[id number] DOB: 21-Jul-1922 DOA: 05/10/2024  PCP: Lenon Layman ORN, MD  Admit date: 05/10/2024 Discharge date: 05/17/2024  Time spent: 35 minutes  Recommendations for Outpatient Follow-up:  Ortho f/u 2 weeks Pcp f/u after finishing rehab  Check BMP 1 week (hyperkalemia)    Discharge Diagnoses:  Principal Problem:   Closed left hip fracture (HCC) Active Problems:   Fall at home, initial encounter   Chronic combined systolic and diastolic CHF (congestive heart failure) (HCC)   CAD (coronary artery disease)   Myocardial injury   Hypertension   Asthma, chronic   Chronic kidney disease, stage 3a (HCC)   Atrial fibrillation, chronic (HCC)   Protein-calorie malnutrition, severe   Acute blood loss anemia   Discharge Condition: stable  Diet recommendation: heart healthy  Filed Weights   05/14/24 0714 05/15/24 0459 05/17/24 0421  Weight: 44.5 kg 45.4 kg 45.4 kg    History of present illness:   Anna Barrett is a 89 y.o. female with medical history significant of  sCHF with EF 35-40%, HTN,  HLD, CAD, MI, asthma, GERD, CKD-3A, A-fib not on anticoagulants, anemia, who presents with fall and left hip pain.    Per his son and granddaughter at the bedside, patient fell accidentally when she was walking at home round 10:00 p.m.  No LOC.  Her granddaughter is very sure that the patient did not have head or neck injury.  Family does not want to do CT scan of the head and neck.  Patient developed left hip pain, which is constant, severe, sharp, nonradiating, aggravated by movement.  Left leg is shortened and externally rotated.  No chest pain, cough, SOB.  Patient has nausea, no vomiting, diarrhea or abdominal pain.  No symptoms of UTI.    Hospital Course:   Closed left hip fracture (HCC) X-ray showed intertrochanteric fracture of the left hip S/p left hip cephalomedullary nailing 12/30 Pain management with Tylenol , low-dose opioids PT/OT advising snf, discharged  there 2 weeks lovenox , f/u ortho 2 weeks WBAT   Delirium Intermittent episodes here, improving   Constipation Resolved, bm evening 1/2 - continue bowel regimen   Acute urinary retention Foley discontinued pod2, retained, now resolved   Mild hyperkalemia Lokelma  1 dose, resolved. Will resume home torsemide  at discharge - bmp 1 week  - hold home potassium for now   Acute blood loss anemia 2/2 fracture and surgery. Hgb trended to 7s, transfused 1 unit, appropriate response after that   Aki on Chronic kidney disease, stage 3a [facility]):  Resolved   Chronic combined systolic and diastolic CHF (congestive heart failure) (HCC) 2D echo on 10/30/2023 showed EF of 35-40% with grade 1 diastolic dysfunction. CHF seems to be compensated. Po now adequate, can resume home torsemide  at discharge   CAD (coronary artery disease) and myocardial injury:  Troponin 27>29, no chest pain.  Likely demand ischemia. Patient previously stopped taking aspirin  Continue Coreg    Hypertension controlled Continue Coreg  Resumed home amlodipine     Asthma, chronic: Stable Bronchodilators, and prn Mucinex    Atrial fibrillation, chronic (HCC):  Patient is not taking anticoagulants. -Continue Coreg  3 125 mg twice daily   Protein-calorie malnutrition, severe: Body weight 38.4 kg, BMI 19.6   Procedures: 05/11/24: Intramedullary nailing of left femur with cephalomedullary device (CPT 27245)   Consultations: orthopedics  Discharge Exam: Vitals:   05/17/24 0425 05/17/24 0724  BP: (!) 154/60 (!) 125/53  Pulse: 79 75  Resp: 18 16  Temp: 98.3 F (36.8 C) (!) 97.5 F (  36.4 C)  SpO2: 93% 96%    General: NAD Cardiovascular: RRR Respiratory: CTAB save for bibasilar rales Skin: dressings left lateral hip c/d/i  Discharge Instructions   Discharge Instructions     Increase activity slowly   Complete by: As directed    Leave dressing on - Keep it clean, dry, and intact until clinic visit   Complete  by: As directed       Allergies as of 05/17/2024       Reactions   Shellfish Allergy Other (See Comments), Hives   Atorvastatin Other (See Comments)   Iodine Other (See Comments)        Medication List     TAKE these medications    acetaminophen  500 MG tablet Commonly known as: TYLENOL  Take 2 tablets (1,000 mg total) by mouth every 8 (eight) hours.   albuterol  108 (90 Base) MCG/ACT inhaler Commonly known as: VENTOLIN  HFA Inhale 2 puffs into the lungs every 4 (four) hours as needed for wheezing.   amLODipine  2.5 MG tablet Commonly known as: NORVASC  Take 2.5 mg by mouth daily.   carvedilol  3.125 MG tablet Commonly known as: COREG  Take 3.125 mg by mouth 2 (two) times daily with a meal.   Cholecalciferol  25 MCG (1000 UT) tablet Take 1,000 Units by mouth 2 (two) times daily.   enoxaparin  30 MG/0.3ML injection Commonly known as: LOVENOX  Inject 0.3 mLs (30 mg total) into the skin daily.   feeding supplement Liqd Take 237 mLs by mouth 3 (three) times daily between meals. What changed: when to take this   nitroGLYCERIN  0.4 MG SL tablet Commonly known as: NITROSTAT  Place 0.4 mg under the tongue every 5 (five) minutes as needed for chest pain.   omeprazole 10 MG capsule Commonly known as: PRILOSEC Take 10 mg by mouth daily.   ondansetron  4 MG disintegrating tablet Commonly known as: ZOFRAN -ODT Allow 1-2 tablets to dissolve in your mouth every 8 hours as needed for nausea/vomiting   oxyCODONE  5 MG immediate release tablet Commonly known as: Oxy IR/ROXICODONE  Take 0.5 tablets (2.5 mg total) by mouth every 8 (eight) hours as needed for moderate pain (pain score 4-6).   polyethylene glycol 17 g packet Commonly known as: MIRALAX  / GLYCOLAX  Take 34 g by mouth daily. Start taking on: May 18, 2024   potassium chloride  10 MEQ tablet Commonly known as: KLOR-CON  Take 10 mEq by mouth at bedtime.   senna 8.6 MG Tabs tablet Commonly known as: SENOKOT Take 1 tablet by  mouth 2 (two) times daily.   torsemide  20 MG tablet Commonly known as: DEMADEX  Take 20 mg by mouth daily.   Wixela Inhub 100-50 MCG/ACT Aepb Generic drug: fluticasone -salmeterol Inhale 1 puff into the lungs at bedtime.               Discharge Care Instructions  (From admission, onward)           Start     Ordered   05/17/24 0000  Leave dressing on - Keep it clean, dry, and intact until clinic visit        05/17/24 0947           Allergies[1]  Contact information for follow-up providers     Ezra Jackquline RAMAN, MD Follow up in 2 week(s).   Specialty: Orthopedic Surgery Contact information: [address] [place] [address] [phone]         Lenon Layman ORN, MD Follow up.   Specialty: Internal Medicine Contact information: [address] [facility] [place] [address] [phone]              Contact information for after-discharge care     Destination     [facility] .   Service: Skilled Nursing Contact information: [address] [place]  [address] [phone]                      The results of significant diagnostics from this hospitalization (including imaging, microbiology, ancillary and laboratory) are listed below for reference.    Significant Diagnostic Studies: DG HIP UNILAT WITH PELVIS 2-3 VIEWS LEFT Result Date: 05/11/2024 CLINICAL DATA:  Elective surgery. EXAM: DG HIP (WITH OR WITHOUT PELVIS) 2-3V LEFT COMPARISON:  Preoperative imaging FINDINGS: Five fluoroscopic spot views of the left hip and femur submitted from the operating room. Femoral intramedullary nail with trans trochanteric and distal locking screw fixation traverse comminuted proximal femur fracture. Fluoroscopy time 5 minutes 55 seconds. Dose 40.68 mGy IMPRESSION: Intraoperative fluoroscopy during left femur fracture ORIF. Electronically Signed   By: Andrea Gasman M.D.    On: 05/11/2024 17:12   DG C-Arm 1-60 Min-No Report Result Date: 05/11/2024 Fluoroscopy was utilized by the requesting physician.  No radiographic interpretation.   DG C-Arm 1-60 Min-No Report Result Date: 05/11/2024 Fluoroscopy was utilized by the requesting physician.  No radiographic interpretation.   DG C-Arm 1-60 Min-No Report Result Date: 05/11/2024 Fluoroscopy was utilized by the requesting physician.  No radiographic interpretation.   DG Chest Port 1 View Result Date: 05/11/2024 EXAM: 1 VIEW(S) XRAY OF THE CHEST 05/10/2024 10:54:57 PM COMPARISON: 10/28/2023 CLINICAL HISTORY: chest pain after fall FINDINGS: LUNGS AND PLEURA: COPD with hyperinflation. No focal pulmonary opacity. No pleural effusion. No pneumothorax. HEART AND MEDIASTINUM: Aortic arch calcifications. No acute abnormality of the cardiac and mediastinal silhouettes. BONES AND SOFT TISSUES: Old healed right humeral neck fracture. IMPRESSION: 1. No acute findings. Electronically signed by: Franky Stanford MD 05/11/2024 01:46 AM EST RP Workstation: [id number]   DG Hip Unilat W or Wo Pelvis 2-3 Views Left Result Date: 05/11/2024 EXAM: 2 OR MORE VIEW(S) XRAY OF THE PELVIS AND LEFT HIP 05/10/2024 10:54:57 PM COMPARISON: None available. CLINICAL HISTORY: fall, w pain and shortening/external rotation FINDINGS: BONES AND JOINTS: SI joints are symmetric. Partially visualized intramedullary nail fixation of the right hip in place. Acute comminuted intertrochanteric fracture of the left hip with varus angulation. SOFT TISSUES: Vascular calcifications. IMPRESSION: 1. Acute comminuted intertrochanteric fracture of the left hip with varus angulation. 2. Partially visualized intramedullary nail fixation of the right hip in place. Electronically signed by: Franky Stanford MD 05/11/2024 01:45 AM EST RP Workstation: [id number]    Microbiology: No results found for this or any previous visit (from the past 240 hours).   Labs: Basic Metabolic  Panel: Recent Labs  Lab 05/12/24 0605 05/13/24 1126 05/14/24 0503 05/15/24 0457 05/17/24 0545  NA 141 142 145 143 140  K 4.6 4.1 4.2 4.6 5.0  CL 104 107 111 109 106  CO2 29 29 28 28 28   GLUCOSE 156* 119* 103* 117* 106*  BUN 49* 57* 52* 47* 42*  CREATININE 1.86* 1.75* 1.38* 1.22* 1.01*  CALCIUM 8.5* 8.1* 8.1* 8.5* 8.7*   Liver Function Tests: Recent Labs  Lab 05/10/24 2237  AST 14*  ALT 17  ALKPHOS 76  BILITOT 0.4  PROT 7.1  ALBUMIN 3.8   No results for input(s): LIPASE, AMYLASE in the last 168 hours. No results  for input(s): AMMONIA in the last 168 hours. CBC: Recent Labs  Lab 05/10/24 2237 05/11/24 0829 05/13/24 0017 05/13/24 1126 05/13/24 1406 05/14/24 1134 05/17/24 0545  WBC 12.4*   < > 5.5 6.6 7.9 6.5 5.7  NEUTROABS 9.5*  --   --   --   --   --   --   HGB 12.5   < > 7.2* 8.6* 9.5* 8.9* 9.6*  HCT 38.4   < > 22.5* 25.4* 28.3* 28.0* 30.0*  MCV 93.0   < > 94.9 92.0 91.6 95.9 96.2  PLT 243   < > 148* 135* 156 153 243   < > = values in this interval not displayed.   Cardiac Enzymes: No results for input(s): CKTOTAL, CKMB, CKMBINDEX, TROPONINI in the last 168 hours. BNP: BNP (last 3 results) Recent Labs    10/28/23 1721  BNP 1,013.7*    ProBNP (last 3 results) Recent Labs    05/10/24 2237  PROBNP 867.0*    CBG: No results for input(s): GLUCAP in the last 168 hours.     Signed:  Devaughn KATHEE Ban MD.  [facility] 05/17/2024, 9:50 AM     [1]  Allergies Allergen Reactions   Shellfish Allergy Other (See Comments) and Hives   Atorvastatin Other (See Comments)   Iodine Other (See Comments)

## 2024-05-17 NOTE — Progress Notes (Signed)
 Patient has been discharge to  [facility] Her son is at bedside.  She is alert and oriented with some forgetfulness. Patient is able to stand and pivot with walker. 1 person to assist. Continent of bowel and bladder but wears pull ups routinely for leakage. [facility] to transport to  receiving facility via stretcher. Report give to Nurse [facility].

## 2024-05-17 NOTE — TOC Transition Note (Addendum)
 Transition of Care [facility]) - Discharge Note   Patient Details  Name: TRYSTAN EADS MRN: [id number] Date of Birth: Apr 11, 1923  [facility] CM/SW Contact:  Alvaro Louder, LCSW Phone Number: 05/17/2024, 10:20 AM   Clinical Narrative:   [facility] received insurance approval for patient to admit to SNF [facility] . LCSWA confirmed with MD that patient is stable for discharge. LCSWA notified the son (POA) and they are in agreement with discharge . [facility]WA confirmed bed is available at [facility] Transport arranged with [facility] for next available.    204A. Number to call report [phone]   [facility] signing off        Patient Goals and CMS Choice            Discharge Placement                       Discharge Plan and Services Additional resources added to the After Visit Summary for                                       Social Drivers of Health (SDOH) Interventions SDOH Screenings   Food Insecurity: No Food Insecurity (05/11/2024)  Housing: Low Risk (05/11/2024)  Transportation Needs: No Transportation Needs (05/11/2024)  Utilities: Not At Risk (05/11/2024)  Financial Resource Strain: Low Risk  (02/11/2023)   Received from [facility]  Social Connections: Moderately Integrated (05/11/2024)  Tobacco Use: Low Risk (05/10/2024)     Readmission Risk Interventions     No data to display

## 2024-05-17 NOTE — Plan of Care (Signed)
" °  Problem: Urinary Elimination: Goal: Will remain free from infection Outcome: Progressing   Problem: Skin Integrity: Goal: Demonstrates signs of wound healing without infection Outcome: Progressing   Problem: Respiratory: Goal: Will regain and/or maintain adequate ventilation Outcome: Progressing   Problem: Pain Managment: Goal: General experience of comfort will improve and/or be controlled Outcome: Progressing   Problem: Nutrition: Goal: Adequate nutrition will be maintained Outcome: Progressing   "

## 2024-05-31 ENCOUNTER — Emergency Department (HOSPITAL_COMMUNITY)

## 2024-05-31 ENCOUNTER — Other Ambulatory Visit: Payer: Self-pay

## 2024-05-31 ENCOUNTER — Encounter (HOSPITAL_COMMUNITY): Payer: Self-pay

## 2024-05-31 ENCOUNTER — Inpatient Hospital Stay (HOSPITAL_COMMUNITY)
Admission: EM | Admit: 2024-05-31 | Discharge: 2024-06-04 | DRG: 176 | Disposition: A | Discharge status: Skilled Nursing Facility | Source: Skilled Nursing Facility | Attending: Internal Medicine | Admitting: Internal Medicine

## 2024-05-31 ENCOUNTER — Emergency Department (HOSPITAL_BASED_OUTPATIENT_CLINIC_OR_DEPARTMENT_OTHER)

## 2024-05-31 DIAGNOSIS — N1831 Chronic kidney disease, stage 3a: Secondary | ICD-10-CM | POA: Diagnosis present

## 2024-05-31 DIAGNOSIS — I252 Old myocardial infarction: Secondary | ICD-10-CM

## 2024-05-31 DIAGNOSIS — R079 Chest pain, unspecified: Secondary | ICD-10-CM

## 2024-05-31 DIAGNOSIS — M7989 Other specified soft tissue disorders: Secondary | ICD-10-CM

## 2024-05-31 DIAGNOSIS — I255 Ischemic cardiomyopathy: Secondary | ICD-10-CM | POA: Diagnosis present

## 2024-05-31 DIAGNOSIS — Z79899 Other long term (current) drug therapy: Secondary | ICD-10-CM

## 2024-05-31 DIAGNOSIS — E785 Hyperlipidemia, unspecified: Secondary | ICD-10-CM | POA: Diagnosis present

## 2024-05-31 DIAGNOSIS — I4891 Unspecified atrial fibrillation: Secondary | ICD-10-CM | POA: Diagnosis present

## 2024-05-31 DIAGNOSIS — I130 Hypertensive heart and chronic kidney disease with heart failure and stage 1 through stage 4 chronic kidney disease, or unspecified chronic kidney disease: Secondary | ICD-10-CM | POA: Diagnosis present

## 2024-05-31 DIAGNOSIS — J45909 Unspecified asthma, uncomplicated: Secondary | ICD-10-CM | POA: Diagnosis present

## 2024-05-31 DIAGNOSIS — R7989 Other specified abnormal findings of blood chemistry: Secondary | ICD-10-CM

## 2024-05-31 DIAGNOSIS — I5022 Chronic systolic (congestive) heart failure: Secondary | ICD-10-CM | POA: Diagnosis present

## 2024-05-31 DIAGNOSIS — I2510 Atherosclerotic heart disease of native coronary artery without angina pectoris: Secondary | ICD-10-CM | POA: Diagnosis present

## 2024-05-31 DIAGNOSIS — I2699 Other pulmonary embolism without acute cor pulmonale: Principal | ICD-10-CM | POA: Diagnosis present

## 2024-05-31 DIAGNOSIS — I2694 Multiple subsegmental pulmonary emboli without acute cor pulmonale: Principal | ICD-10-CM | POA: Diagnosis present

## 2024-05-31 DIAGNOSIS — M810 Age-related osteoporosis without current pathological fracture: Secondary | ICD-10-CM | POA: Diagnosis present

## 2024-05-31 DIAGNOSIS — Z90710 Acquired absence of both cervix and uterus: Secondary | ICD-10-CM

## 2024-05-31 DIAGNOSIS — K5900 Constipation, unspecified: Secondary | ICD-10-CM | POA: Diagnosis present

## 2024-05-31 DIAGNOSIS — Z66 Do not resuscitate: Secondary | ICD-10-CM | POA: Diagnosis present

## 2024-05-31 DIAGNOSIS — Z8249 Family history of ischemic heart disease and other diseases of the circulatory system: Secondary | ICD-10-CM

## 2024-05-31 DIAGNOSIS — K219 Gastro-esophageal reflux disease without esophagitis: Secondary | ICD-10-CM | POA: Diagnosis present

## 2024-05-31 DIAGNOSIS — R627 Adult failure to thrive: Secondary | ICD-10-CM | POA: Diagnosis present

## 2024-05-31 LAB — BASIC METABOLIC PANEL WITH GFR
Anion gap: 9 (ref 5–15)
BUN: 39 mg/dL — ABNORMAL HIGH (ref 8–23)
CO2: 25 mmol/L (ref 22–32)
Calcium: 8.9 mg/dL (ref 8.9–10.3)
Chloride: 103 mmol/L (ref 98–111)
Creatinine, Ser: 1.22 mg/dL — ABNORMAL HIGH (ref 0.44–1.00)
GFR, Estimated: 40 mL/min — ABNORMAL LOW
Glucose, Bld: 120 mg/dL — ABNORMAL HIGH (ref 70–99)
Potassium: 4.8 mmol/L (ref 3.5–5.1)
Sodium: 137 mmol/L (ref 135–145)

## 2024-05-31 LAB — CBC
HCT: 33.7 % — ABNORMAL LOW (ref 36.0–46.0)
Hemoglobin: 10.8 g/dL — ABNORMAL LOW (ref 12.0–15.0)
MCH: 32.2 pg (ref 26.0–34.0)
MCHC: 32 g/dL (ref 30.0–36.0)
MCV: 100.6 fL — ABNORMAL HIGH (ref 80.0–100.0)
Platelets: 398 K/uL (ref 150–400)
RBC: 3.35 MIL/uL — ABNORMAL LOW (ref 3.87–5.11)
RDW: 15.9 % — ABNORMAL HIGH (ref 11.5–15.5)
WBC: 5.7 K/uL (ref 4.0–10.5)
nRBC: 0 % (ref 0.0–0.2)

## 2024-05-31 LAB — D-DIMER, QUANTITATIVE: D-Dimer, Quant: 2.86 ug{FEU}/mL — ABNORMAL HIGH (ref 0.00–0.50)

## 2024-05-31 LAB — I-STAT CHEM 8, ED
BUN: 40 mg/dL — ABNORMAL HIGH (ref 8–23)
Calcium, Ion: 1.15 mmol/L (ref 1.15–1.40)
Chloride: 104 mmol/L (ref 98–111)
Creatinine, Ser: 1.3 mg/dL — ABNORMAL HIGH (ref 0.44–1.00)
Glucose, Bld: 119 mg/dL — ABNORMAL HIGH (ref 70–99)
HCT: 34 % — ABNORMAL LOW (ref 36.0–46.0)
Hemoglobin: 11.6 g/dL — ABNORMAL LOW (ref 12.0–15.0)
Potassium: 4.7 mmol/L (ref 3.5–5.1)
Sodium: 141 mmol/L (ref 135–145)
TCO2: 25 mmol/L (ref 22–32)

## 2024-05-31 LAB — HEPARIN LEVEL (UNFRACTIONATED): Heparin Unfractionated: 0.33 [IU]/mL (ref 0.30–0.70)

## 2024-05-31 LAB — TROPONIN T, HIGH SENSITIVITY
Troponin T High Sensitivity: 55 ng/L — ABNORMAL HIGH (ref 0–19)
Troponin T High Sensitivity: 54 ng/L — ABNORMAL HIGH (ref 0–19)

## 2024-05-31 MED ORDER — IOHEXOL 350 MG/ML SOLN
75.0000 mL | Freq: Once | INTRAVENOUS | Status: AC | PRN
  Administered 2024-05-31: 75 mL via INTRAVENOUS

## 2024-05-31 MED ORDER — DIPHENHYDRAMINE HCL 25 MG PO CAPS: 50.0000 mg | ORAL_CAPSULE | Freq: Once | ORAL | Status: DC

## 2024-05-31 MED ORDER — PANTOPRAZOLE SODIUM 40 MG PO TBEC
40.0000 mg | DELAYED_RELEASE_TABLET | Freq: Every day | ORAL | Status: DC
  Administered 2024-05-31 – 2024-06-04 (×5): 40 mg via ORAL
  Filled 2024-05-31 (×5): qty 1

## 2024-05-31 MED ORDER — DIPHENHYDRAMINE HCL 50 MG/ML IJ SOLN: 50.0000 mg | Freq: Once | INTRAMUSCULAR | Status: DC

## 2024-05-31 MED ORDER — AMLODIPINE BESYLATE 5 MG PO TABS
2.5000 mg | ORAL_TABLET | Freq: Every day | ORAL | Status: DC
  Administered 2024-06-01 – 2024-06-04 (×4): 2.5 mg via ORAL
  Filled 2024-05-31 (×4): qty 1

## 2024-05-31 MED ORDER — FLUTICASONE FUROATE-VILANTEROL 100-25 MCG/ACT IN AEPB
1.0000 | INHALATION_SPRAY | Freq: Every day | RESPIRATORY_TRACT | Status: DC
  Filled 2024-05-31: qty 28

## 2024-05-31 MED ORDER — POLYETHYLENE GLYCOL 3350 17 G PO PACK
34.0000 g | PACK | Freq: Every day | ORAL | Status: DC
  Administered 2024-05-31 – 2024-06-02 (×3): 34 g via ORAL
  Filled 2024-05-31 (×4): qty 2

## 2024-05-31 MED ORDER — METHYLPREDNISOLONE SODIUM SUCC 40 MG IJ SOLR
40.0000 mg | Freq: Once | INTRAMUSCULAR | Status: AC
  Administered 2024-05-31: 40 mg via INTRAVENOUS
  Filled 2024-05-31: qty 1

## 2024-05-31 MED ORDER — HEPARIN (PORCINE) 25000 UT/250ML-% IV SOLN
900.0000 [IU]/h | INTRAVENOUS | Status: DC
  Administered 2024-05-31: 800 [IU]/h via INTRAVENOUS
  Administered 2024-06-01: 900 [IU]/h via INTRAVENOUS
  Filled 2024-05-31 (×2): qty 250

## 2024-05-31 MED ORDER — HEPARIN BOLUS VIA INFUSION
2000.0000 [IU] | Freq: Once | INTRAVENOUS | Status: AC
  Administered 2024-05-31: 2000 [IU] via INTRAVENOUS
  Filled 2024-05-31: qty 2000

## 2024-05-31 MED ORDER — OXYCODONE HCL 5 MG PO TABS: 2.5000 mg | ORAL_TABLET | Freq: Three times a day (TID) | ORAL | Status: DC | PRN

## 2024-05-31 MED ORDER — TORSEMIDE 20 MG PO TABS
20.0000 mg | ORAL_TABLET | Freq: Every day | ORAL | Status: DC
  Administered 2024-05-31 – 2024-06-04 (×5): 20 mg via ORAL
  Filled 2024-05-31 (×5): qty 1

## 2024-05-31 MED ORDER — IPRATROPIUM-ALBUTEROL 0.5-2.5 (3) MG/3ML IN SOLN: 3.0000 mL | Freq: Four times a day (QID) | RESPIRATORY_TRACT | Status: DC | PRN

## 2024-05-31 MED ORDER — CARVEDILOL 3.125 MG PO TABS
3.1250 mg | ORAL_TABLET | Freq: Two times a day (BID) | ORAL | Status: DC
  Administered 2024-06-01 – 2024-06-04 (×7): 3.125 mg via ORAL
  Filled 2024-05-31 (×7): qty 1

## 2024-05-31 MED ORDER — AMLODIPINE BESYLATE 5 MG PO TABS: 2.5000 mg | ORAL_TABLET | Freq: Every day | ORAL | Status: DC

## 2024-05-31 MED ORDER — SENNA 8.6 MG PO TABS
1.0000 | ORAL_TABLET | Freq: Two times a day (BID) | ORAL | Status: DC
  Administered 2024-05-31 – 2024-06-04 (×8): 8.6 mg via ORAL
  Filled 2024-05-31 (×8): qty 1

## 2024-05-31 MED ORDER — ACETAMINOPHEN 325 MG PO TABS
650.0000 mg | ORAL_TABLET | Freq: Once | ORAL | Status: AC
  Administered 2024-05-31: 650 mg via ORAL
  Filled 2024-05-31: qty 2

## 2024-05-31 MED ORDER — CARVEDILOL 3.125 MG PO TABS: 3.1250 mg | ORAL_TABLET | Freq: Two times a day (BID) | ORAL | Status: DC

## 2024-05-31 NOTE — Progress Notes (Signed)
 PHARMACY - ANTICOAGULATION CONSULT NOTE  Pharmacy Consult for IV heparin   Indication: pulmonary embolus  Allergies[1]  Patient Measurements: Height: 4' 11 (149.9 cm) Weight: 45.4 kg (100 lb 1.4 oz) IBW/kg (Calculated) : 43.2 HEPARIN  DW (KG): 45.4  Vital Signs: Temp: 98 F (36.7 C) (01/19 1420) Temp Source: Oral (01/19 1420) BP: 129/53 (01/19 1420) Pulse Rate: 85 (01/19 1420)  Labs: Recent Labs    05/31/24 0744 05/31/24 0755 05/31/24 1759  HGB 10.8* 11.6*  --   HCT 33.7* 34.0*  --   PLT 398  --   --   HEPARINUNFRC  --   --  0.33  CREATININE 1.22* 1.30*  --     Estimated Creatinine Clearance: 16.1 mL/min (A) (by C-G formula based on SCr of 1.3 mg/dL (H)).   Medical History: Past Medical History:  Diagnosis Date   Asthma    Carotid stenosis    Headache    MIGRAINES   History of MI (myocardial infarction)    Hyperglycemia    Hyperlipidemia    Hypertension    Ischemic cardiomyopathy    Myocardial infarction [facility]) 1962   Osteoporosis    Parathyroid adenoma    Assessment: Anna Barrett is a 89 y.o. year old female admitted on 05/31/2024 with concern for PE. CTA with few scattered segmental/subsegmental PE in the right upper and left lower lobes (no RHS noted). On enoxaparin  30mg  prior to admission (last dose 10/18 1030). Pharmacy consulted to dose heparin .  Initial heparin  level 0.33  Goal of Therapy:  Heparin  level 0.3-0.7 units/ml Monitor platelets by anticoagulation protocol: Yes   Plan:  Increase heparin  to 900 units / hr to prevent drop to less than <0.3 Daily heparin  level, CBC, and monitoring for bleeding  Thank you. Olam Monte, PharmD 05/31/2024,7:01 PM        [1]  Allergies Allergen Reactions   Shellfish Allergy Hives   Iodine Other (See Comments)    Hx shellfish allergy   Lipitor [Atorvastatin] Other (See Comments)    Unknown reaction

## 2024-05-31 NOTE — Progress Notes (Signed)
 Pt  admitted for PE. Pt A&OX4. BP (!) 129/53 (BP Location: Right Arm)   Pulse 85   Temp 98 F (36.7 C) (Oral)   Resp 18   Ht 4' 11 (1.499 m)   Wt 45.4 kg   SpO2 97%   BMI 20.22 kg/m  Patient had recent hip replacement after fall, Left hip bruising. Pt refused pain meds. Pt on tele NSR, heparin  running at 8. Bed in lowest position, bed alarm on, wheels locked. Family at bedside. No other needs voiced at this time. Anna Barrett 05/31/24 Anna Barrett

## 2024-05-31 NOTE — ED Triage Notes (Signed)
 PT BIB EMS from [facility], c/o chest pain, given nitroglycerin  at facility. Pt says she is not having any pain at all at this time, but son wanted her to be transported to the hospital. Pt has dementia at baseline, verbally and physically aggressive. Hx of left femur fracture.

## 2024-05-31 NOTE — H&P (Signed)
 " History and Physical    Patient: Anna Barrett FMW:[id number] DOB: 1922-06-14 DOA: 05/31/2024 DOS: the patient was seen and examined on 05/31/2024 PCP: Lenon Layman ORN, MD  Patient coming from: Home  Chief Complaint:  Chief Complaint  Patient presents with   Chest Pain   HPI: Anna Barrett is a 89 y.o. female with medical history significant of sCHF with EF 35-40%, HTN, HLD, CAD, MI, asthma, GERD, CKD-3A, A-fib not on anticoagulants, anemia, and recent admission for L hip fx s/p repair on 12/30 who p/w chest pain and found to have bilateral subsegmental pulmonary emboli.  The patient presented with chest pain experienced earlier in the day at SNF, where pt is residing while recovering for recent L hip fracture. Per report from son, she endorsed cp that was treated with SLN and transported to the ED for further evaluation. Of note, the cp resolved prior to arrival, and the patient did not require oxygen and was breathing adequately on their own.  Per son, the patient had undergone hip surgery on May 11, 2024, which was identified as the likely cause of the blood clots due to reduced mobility post-surgery. The patient reported being able to walk only short distances with assistance since this procedure, and has experienced back pain across her back for the past two days.   In the ED, pt AFVSS. Labs notable for Cr 1.22>1.3, abd D-dimer 2.86. CTA showed a few scattered segmental/subsegmental pulmonary emboli in the right upper and left lower lobes. EDP started IV heparin  gtt and requested admission.   Review of Systems: As mentioned in the history of present illness. All other systems reviewed and are negative. Past Medical History:  Diagnosis Date   Asthma    Carotid stenosis    Headache    MIGRAINES   History of MI (myocardial infarction)    Hyperglycemia    Hyperlipidemia    Hypertension    Ischemic cardiomyopathy    Myocardial infarction [facility]) 1962   Osteoporosis     Parathyroid adenoma    Past Surgical History:  Procedure Laterality Date   ABDOMINAL HYSTERECTOMY     APPENDECTOMY     CATARACT EXTRACTION Bilateral    ESOPHAGOGASTRODUODENOSCOPY (EGD) WITH PROPOFOL  N/A 04/10/2020   Procedure: ESOPHAGOGASTRODUODENOSCOPY (EGD) WITH PROPOFOL ;  Surgeon: Toledo, Ladell POUR, MD;  Location: [facility] ENDOSCOPY;  Service: Gastroenterology;  Laterality: N/A;   ESOPHAGOGASTRODUODENOSCOPY (EGD) WITH PROPOFOL  N/A 06/06/2021   Procedure: ESOPHAGOGASTRODUODENOSCOPY (EGD) WITH PROPOFOL ;  Surgeon: Therisa Bi, MD;  Location: [facility] ENDOSCOPY;  Service: Gastroenterology;  Laterality: N/A;   ESOPHAGOGASTRODUODENOSCOPY (EGD) WITH PROPOFOL  N/A 06/21/2021   Procedure: ESOPHAGOGASTRODUODENOSCOPY (EGD) WITH PROPOFOL ;  Surgeon: Toledo, Ladell POUR, MD;  Location: [facility] ENDOSCOPY;  Service: Gastroenterology;  Laterality: N/A;   EYE SURGERY     FRACTURE SURGERY     INTRAMEDULLARY (IM) NAIL INTERTROCHANTERIC Left 05/11/2024   Procedure: FIXATION, FRACTURE, INTERTROCHANTERIC, WITH INTRAMEDULLARY ROD;  Surgeon: Ezra Jackquline RAMAN, MD;  Location: [facility] ORS;  Service: Orthopedics;  Laterality: Left;   ORIF ANKLE FRACTURE Left 04/17/2015   Procedure: OPEN REDUCTION INTERNAL FIXATION (ORIF) ANKLE FRACTURE;  Surgeon: Norleen JINNY Maltos, MD;  Location: [facility] ORS;  Service: Orthopedics;  Laterality: Left;   Social History:  reports that she has never smoked. She has never used smokeless tobacco. She reports that she does not drink alcohol and does not use drugs.  Allergies[1]  Family History  Problem Relation Age of Onset   Heart disease Mother    Heart disease Father  Prior to Admission medications  Medication Sig Start Date End Date Taking? Authorizing Provider  acetaminophen  (TYLENOL ) 500 MG tablet Take 2 tablets (1,000 mg total) by mouth every 8 (eight) hours. 05/15/24   Charlene Debby BROCKS, PA-C  albuterol  (VENTOLIN  HFA) 108 (90 Base) MCG/ACT inhaler Inhale 2 puffs into the lungs every 4 (four) hours as  needed for wheezing. 01/08/23 05/10/24  [provider]  amLODipine  (NORVASC ) 2.5 MG tablet Take 2.5 mg by mouth daily.    [provider]  carvedilol  (COREG ) 3.125 MG tablet Take 3.125 mg by mouth 2 (two) times daily with a meal. 07/17/22   [provider]  Cholecalciferol  25 MCG (1000 UT) tablet Take 1,000 Units by mouth 2 (two) times daily.    [provider]  enoxaparin  (LOVENOX ) 30 MG/0.3ML injection Inject 0.3 mLs (30 mg total) into the skin daily. 05/13/24 05/27/24  Charlene Debby BROCKS, PA-C  feeding supplement (ENSURE ENLIVE / ENSURE PLUS) LIQD Take 237 mLs by mouth 3 (three) times daily between meals. Patient taking differently: Take 237 mLs by mouth 2 (two) times daily between meals. 06/06/21   Fausto Burnard LABOR, DO  nitroGLYCERIN  (NITROSTAT ) 0.4 MG SL tablet Place 0.4 mg under the tongue every 5 (five) minutes as needed for chest pain.    [provider]  omeprazole (PRILOSEC) 10 MG capsule Take 10 mg by mouth daily.    [provider]  ondansetron  (ZOFRAN -ODT) 4 MG disintegrating tablet Allow 1-2 tablets to dissolve in your mouth every 8 hours as needed for nausea/vomiting 05/18/22   Gordan Huxley, MD  oxyCODONE  (OXY IR/ROXICODONE ) 5 MG immediate release tablet Take 0.5 tablets (2.5 mg total) by mouth every 8 (eight) hours as needed for moderate pain (pain score 4-6). 05/15/24   Charlene Debby BROCKS, PA-C  polyethylene glycol (MIRALAX  / GLYCOLAX ) 17 g packet Take 34 g by mouth daily. 05/18/24   Wouk, Devaughn Sayres, MD  [Paused] potassium chloride  (KLOR-CON ) 10 MEQ tablet Take 10 mEq by mouth at bedtime. Wait to take this until your doctor or other care provider tells you to start again.    [provider]  senna (SENOKOT) 8.6 MG TABS tablet Take 1 tablet by mouth 2 (two) times daily.    [provider]  torsemide  (DEMADEX ) 20 MG tablet Take 20 mg by mouth daily.    [provider]  NAPOLEON INHUB 100-50 MCG/ACT AEPB Inhale 1 puff  into the lungs at bedtime.    [provider]    Physical Exam: Vitals:   05/31/24 0737 05/31/24 0740 05/31/24 1103  BP:  (!) 159/116 (!) 120/92  Pulse:  82 73  Resp:  18 (!) 26  Temp:  97.6 F (36.4 C)   TempSrc:  Axillary   SpO2:  98% 96%  Weight: 45.4 kg    Height: 4' 11 (1.499 m)     General: Alert, oriented x3, resting comfortably in no acute distress Respiratory: Lungs clear to auscultation bilaterally with normal respiratory effort; no w/r/r Cardiovascular: Regular rate and rhythm w/o m/r/g   Data Reviewed:  Lab Results  Component Value Date   WBC 5.7 05/31/2024   HGB 11.6 (L) 05/31/2024   HCT 34.0 (L) 05/31/2024   MCV 100.6 (H) 05/31/2024   PLT 398 05/31/2024   Lab Results  Component Value Date   GLUCOSE 119 (H) 05/31/2024   CALCIUM 8.9 05/31/2024   NA 141 05/31/2024   K 4.7 05/31/2024   CO2 25 05/31/2024   CL 104 05/31/2024  BUN 40 (H) 05/31/2024   CREATININE 1.30 (H) 05/31/2024   Lab Results  Component Value Date   ALT 17 05/10/2024   AST 14 (L) 05/10/2024   ALKPHOS 76 05/10/2024   BILITOT 0.4 05/10/2024   Lab Results  Component Value Date   INR 1.1 05/11/2024   INR 1.0 09/10/2020   INR 1.0 09/15/2013   Radiology: VAS US  LOWER EXTREMITY VENOUS (DVT) (ONLY [facility] & [facility]) Result Date: 05/31/2024  Lower Venous DVT Study Patient Name:  JAVARIA KNAPKE  Date of Exam:   05/31/2024 Medical Rec #: [id number]      Accession #:    [id number] Date of Birth: 10/25/1922      Patient Gender: F Patient Age:   26 years Exam Location:  [facility] Procedure:      VAS US  LOWER EXTREMITY VENOUS (DVT) Referring Phys: LAMAR PATERSON --------------------------------------------------------------------------------  Indications: Pain.  Risk Factors: Recent left femur fracture s/p surgical repair on 05/11/2024. Comparison Study: No previous LLEV exams Performing Technologist: Jody Hill RVT, RDMS  Examination Guidelines: A complete evaluation includes B-mode imaging,  spectral Doppler, color Doppler, and power Doppler as needed of all accessible portions of each vessel. Bilateral testing is considered an integral part of a complete examination. Limited examinations for reoccurring indications may be performed as noted. The reflux portion of the exam is performed with the patient in reverse Trendelenburg.  +-----+---------------+---------+-----------+----------+--------------+ RIGHTCompressibilityPhasicitySpontaneityPropertiesThrombus Aging +-----+---------------+---------+-----------+----------+--------------+ CFV  Full           Yes      Yes                                 +-----+---------------+---------+-----------+----------+--------------+   +---------+---------------+---------+-----------+----------+-------------------+ LEFT     CompressibilityPhasicitySpontaneityPropertiesThrombus Aging      +---------+---------------+---------+-----------+----------+-------------------+ CFV      Full           Yes      Yes                                      +---------+---------------+---------+-----------+----------+-------------------+ SFJ      Full                                                             +---------+---------------+---------+-----------+----------+-------------------+ FV Prox  Full           Yes      Yes                                      +---------+---------------+---------+-----------+----------+-------------------+ FV Mid   Full           Yes      Yes                                      +---------+---------------+---------+-----------+----------+-------------------+ FV DistalFull           Yes      Yes                                      +---------+---------------+---------+-----------+----------+-------------------+  PFV      Full                                                             +---------+---------------+---------+-----------+----------+-------------------+ POP      Full            Yes      Yes                                      +---------+---------------+---------+-----------+----------+-------------------+ PTV      Full                                         Not well visualized +---------+---------------+---------+-----------+----------+-------------------+ PERO     Full                                         Not well visualized +---------+---------------+---------+-----------+----------+-------------------+    Summary: RIGHT: - No evidence of common femoral vein obstruction.   LEFT: - There is no evidence of deep vein thrombosis in the lower extremity.  - No cystic structure found in the popliteal fossa. Subcutaneous edema in area of the calf.  *See table(s) above for measurements and observations.    Preliminary    CT Angio Chest PE W and/or Wo Contrast Result Date: 05/31/2024 CLINICAL DATA:  Positive D-dimer, chest pain. EXAM: CT ANGIOGRAPHY CHEST WITH CONTRAST TECHNIQUE: Multidetector CT imaging of the chest was performed using the standard protocol during bolus administration of intravenous contrast. Multiplanar CT image reconstructions and MIPs were obtained to evaluate the vascular anatomy. RADIATION DOSE REDUCTION: This exam was performed according to the departmental dose-optimization program which includes automated exposure control, adjustment of the mA and/or kV according to patient size and/or use of iterative reconstruction technique. CONTRAST:  75mL OMNIPAQUE  IOHEXOL  350 MG/ML SOLN COMPARISON:  None Available. FINDINGS: Cardiovascular: Subsegmental pulmonary arterial filling defect in the right upper lobe (8/64-68). Segmental/subsegmental pulmonary arterial filling defects in the left lower lobe (8/125-138). Atherosclerotic calcification of the aorta, aortic valve and coronary arteries. Heart is enlarged with left ventricular hypertrophy. No pericardial effusion. Mediastinum/Nodes: No pathologically enlarged mediastinal, hilar or axillary lymph nodes.  Air and food debris in the esophagus, indicative of dysmotility. Lungs/Pleura: Small right and trace left pleural effusions with subsegmental volume loss in both lower lobes. Lungs are otherwise clear. Airway is unremarkable. Upper Abdomen: Cholecystectomy. Thickening of the left adrenal gland. No specific follow-up necessary. Large hiatal hernia. Visualized portions of the liver, adrenal glands, kidneys, spleen, pancreas, stomach and bowel are otherwise grossly unremarkable. No upper abdominal adenopathy. Musculoskeletal: Osteopenia.  Degenerative changes in the spine. Review of the MIP images confirms the above findings. IMPRESSION: 1. A few scattered segmental/subsegmental pulmonary emboli in the right upper and left lower lobes. Critical Value/emergent results were called by telephone at the time of interpretation on 05/31/2024 at 9:22 am to provider [facility] , who verbally acknowledged these results. 2. Small right and trace left pleural effusions. 3. Large hiatal hernia. 4. Aortic atherosclerosis (ICD10-I70.0). Coronary artery calcification. Electronically  Signed   By: Newell Eke M.D.   On: 05/31/2024 09:23   DG CHEST PORT 1 VIEW Result Date: 05/31/2024 CLINICAL DATA:  Chest pain EXAM: PORTABLE CHEST 1 VIEW COMPARISON:  05/10/2024 FINDINGS: The lungs are clear without focal pneumonia, edema, pneumothorax or pleural effusion. Interstitial markings are diffusely coarsened with chronic features. The cardio pericardial silhouette is enlarged. Bones are diffusely demineralized. Telemetry leads overlie the chest. IMPRESSION: Chronic interstitial coarsening without acute cardiopulmonary findings. Electronically Signed   By: Camellia Candle M.D.   On: 05/31/2024 08:34    Assessment and Plan: 28F h/o sCHF with EF 35-40%, HTN, HLD, CAD, MI, asthma, GERD, CKD-3A, A-fib not on anticoagulants, anemia, and recent admission for L hip fx s/p repair on 12/30 who p/w chest pain and found to have bilateral  subsegmental pulmonary emboli.  Acute b/l subsegmental pulmonary emboli -PT/OT eval pending -IV heparin  gtt per pharmacy protocol for now; consider OAC at discharge  Afib Previously on warfarin but discontinued due to bruising per Epic review -PTA Coreg  3.125mg  BID -IV hep gtt per above  HFrEF HTN -PTA Coreg , and torsemide  -Would benefit from OP cards consult and GDMT initiation  GERD -PTA PPI  Back pain -PTA tylenol  and oxycodone  2.5mg  TID prn  Constipation -PTA senna    Advance Care Planning:   Code Status: Limited: Do not attempt resuscitation (DNR) -DNR-LIMITED -Do Not Intubate/DNI    Consults: N/A  Family Communication: Son, Ozell  Severity of Illness: The appropriate patient status for this patient is INPATIENT. Inpatient status is judged to be reasonable and necessary in order to provide the required intensity of service to ensure the patient's safety. The patient's presenting symptoms, physical exam findings, and initial radiographic and laboratory data in the context of their chronic comorbidities is felt to place them at high risk for further clinical deterioration. Furthermore, it is not anticipated that the patient will be medically stable for discharge from the hospital within 2 midnights of admission.   * I certify that at the point of admission it is my clinical judgment that the patient will require inpatient hospital care spanning beyond 2 midnights from the point of admission due to high intensity of service, high risk for further deterioration and high frequency of surveillance required.*   ------- I spent 56 minutes reviewing previous notes, at the bedside counseling/discussing the treatment plan, and performing clinical documentation.  Author: Marsha Ada, MD 05/31/2024 11:38 AM  For on call review [email or web address].      [1]  Allergies Allergen Reactions   Shellfish Allergy Other (See Comments) and Hives   Atorvastatin Other (See Comments)    Iodine Other (See Comments)    Shrimp allergy   "

## 2024-05-31 NOTE — Progress Notes (Signed)
 PHARMACY - ANTICOAGULATION CONSULT NOTE  Pharmacy Consult for IV heparin   Indication: pulmonary embolus  Allergies[1]  Patient Measurements: Height: 4' 11 (149.9 cm) Weight: 45.4 kg (100 lb 1.4 oz) IBW/kg (Calculated) : 43.2 HEPARIN  DW (KG): 45.4  Vital Signs: Temp: 97.6 F (36.4 C) (01/19 0740) Temp Source: Axillary (01/19 0740) BP: 120/92 (01/19 1103) Pulse Rate: 73 (01/19 1103)  Labs: Recent Labs    05/31/24 0744 05/31/24 0755  HGB 10.8* 11.6*  HCT 33.7* 34.0*  PLT 398  --   CREATININE 1.22* 1.30*    Estimated Creatinine Clearance: 16.1 mL/min (A) (by C-G formula based on SCr of 1.3 mg/dL (H)).   Medical History: Past Medical History:  Diagnosis Date   Asthma    Carotid stenosis    Headache    MIGRAINES   History of MI (myocardial infarction)    Hyperglycemia    Hyperlipidemia    Hypertension    Ischemic cardiomyopathy    Myocardial infarction [facility]) 1962   Osteoporosis    Parathyroid adenoma    Assessment: Anna Barrett is a 89 y.o. year old female admitted on 05/31/2024 with concern for PE. CTA with few scattered segmental/subsegmental PE in the right upper and left lower lobes (no RHS noted). On enoxaparin  30mg  prior to admission (last dose 10/18 1030). Pharmacy consulted to dose heparin .  Goal of Therapy:  Heparin  level 0.3-0.7 units/ml Monitor platelets by anticoagulation protocol: Yes   Plan:  Heparin  2000 units x 1 as bolus followed by heparin  infusion at 800 units/hr 8h heparin  level  Daily heparin  level, CBC, and monitoring for bleeding F/u plans for anticoagulation   Thank you for allowing pharmacy to participate in this patient's care.  Leonor GORMAN Bash, PharmD Emergency Medicine Clinical Pharmacist 05/31/2024,12:10 PM       [1]  Allergies Allergen Reactions   Shellfish Allergy Other (See Comments) and Hives   Atorvastatin Other (See Comments)   Iodine Other (See Comments)    Shrimp allergy

## 2024-05-31 NOTE — Progress Notes (Signed)
 LLE venous duplex has been completed. Preliminary results given to Dr. Yolande.   Results can be found under chart review under CV PROC. 05/31/2024 11:10 AM Gray Doering RVT, RDMS

## 2024-05-31 NOTE — ED Provider Notes (Signed)
 " [place] [facility] AT [facility] Provider Note   CSN: [id number] Arrival date & time: 05/31/24  9265     Patient presents with: Chest Pain   Anna Barrett is a 89 y.o. female.   89 year old female history of CHF, CAD, atrial fibrillation not on anticoagulation, and recent left-sided hip fracture status post IMN on 05/11/2024 who presents to the emergency department chest pain.  History limited due to the patient's cognition at baseline.  Earlier today was complaining of chest pain at her facility.  Son was alerted and wanted her to be transported to the emergency department.  To me she denies any chest pain.  She is not quite sure why she is here.  Denies any shortness of breath or cough.        Prior to Admission medications  Medication Sig Start Date End Date Taking? Authorizing Provider  acetaminophen  (TYLENOL ) 500 MG tablet Take 2 tablets (1,000 mg total) by mouth every 8 (eight) hours. 05/15/24  Yes Charlene Debby BROCKS, PA-C  albuterol  (VENTOLIN  HFA) 108 (90 Base) MCG/ACT inhaler Inhale 2 puffs into the lungs every 4 (four) hours as needed for wheezing. 01/08/23 07/24/24 Yes [provider]  amLODipine  (NORVASC ) 2.5 MG tablet Take 2.5 mg by mouth daily.   Yes [provider]  carvedilol  (COREG ) 3.125 MG tablet Take 3.125 mg by mouth 2 (two) times daily with a meal. 07/17/22  Yes [provider]  Cholecalciferol  25 MCG (1000 UT) tablet Take 1,000 Units by mouth in the morning and at bedtime.   Yes [provider]  enoxaparin  (LOVENOX ) 30 MG/0.3ML injection Inject 0.3 mLs (30 mg total) into the skin daily. 05/13/24 07/24/24 Yes Charlene Debby BROCKS, PA-C  lidocaine  4 % Place 1 patch onto the skin daily. Apply 1 patch topically to right ribcage. Leave on 12 hours and take off for 12 hours.   Yes [provider]  nitroGLYCERIN  (NITROSTAT ) 0.4 MG SL tablet Place 0.4 mg under the tongue every 5 (five) minutes x 3 doses as needed for  chest pain.   Yes [provider]  Nutritional Supplements (BOOST PO) Take 237 mLs by mouth 3 (three) times daily between meals.   Yes [provider]  omeprazole (PRILOSEC) 10 MG capsule Take 10 mg by mouth daily.   Yes [provider]  ondansetron  (ZOFRAN -ODT) 4 MG disintegrating tablet Allow 1-2 tablets to dissolve in your mouth every 8 hours as needed for nausea/vomiting 05/18/22  Yes Gordan Huxley, MD  oxyCODONE  (OXY IR/ROXICODONE ) 5 MG immediate release tablet Take 0.5 tablets (2.5 mg total) by mouth every 8 (eight) hours as needed for moderate pain (pain score 4-6). 05/15/24  Yes Charlene Debby BROCKS, PA-C  polyethylene glycol powder (GLYCOLAX /MIRALAX ) 17 GM/SCOOP powder Take 34 g by mouth daily.   Yes [provider]  [Paused] potassium chloride  (KLOR-CON ) 10 MEQ tablet Take 10 mEq by mouth at bedtime. Wait to take this until your doctor or other care provider tells you to start again.   Yes [provider]  senna (SENOKOT) 8.6 MG TABS tablet Take 1 tablet by mouth in the morning and at bedtime.   Yes [provider]  torsemide  (DEMADEX ) 20 MG tablet Take 20 mg by mouth daily.   Yes [provider]  WIXELA INHUB 100-50 MCG/ACT AEPB Inhale 1 puff into the lungs at bedtime.   Yes [provider]    Allergies: Shellfish allergy, Iodine, and Lipitor [atorvastatin]    Review  of Systems  Updated Vital Signs BP (!) 129/53 (BP Location: Right Arm)   Pulse 85   Temp 98 F (36.7 C) (Oral)   Resp 18   Ht 4' 11 (1.499 m)   Wt 45.4 kg   SpO2 97%   BMI 20.22 kg/m   Physical Exam Vitals and nursing note reviewed.  Constitutional:      General: She is not in acute distress.    Appearance: She is well-developed.  HENT:     Head: Normocephalic and atraumatic.     Right Ear: External ear normal.     Left Ear: External ear normal.     Nose: Nose normal.  Eyes:     Extraocular Movements: Extraocular movements intact.      Conjunctiva/sclera: Conjunctivae normal.     Pupils: Pupils are equal, round, and reactive to light.  Cardiovascular:     Rate and Rhythm: Normal rate and regular rhythm.     Heart sounds: No murmur heard. Pulmonary:     Effort: Pulmonary effort is normal. No respiratory distress.     Breath sounds: Normal breath sounds.  Musculoskeletal:     Cervical back: Normal range of motion and neck supple.     Right lower leg: No edema.     Left lower leg: Edema present.  Skin:    General: Skin is warm and dry.  Neurological:     Mental Status: She is alert and oriented to person, place, and time. Mental status is at baseline.  Psychiatric:        Mood and Affect: Mood normal.     (all labs ordered are listed, but only abnormal results are displayed) Labs Reviewed  BASIC METABOLIC PANEL WITH GFR - Abnormal; Notable for the following components:      Result Value   Glucose, Bld 120 (*)    BUN 39 (*)    Creatinine, Ser 1.22 (*)    GFR, Estimated 40 (*)    All other components within normal limits  CBC - Abnormal; Notable for the following components:   RBC 3.35 (*)    Hemoglobin 10.8 (*)    HCT 33.7 (*)    MCV 100.6 (*)    RDW 15.9 (*)    All other components within normal limits  D-DIMER, QUANTITATIVE - Abnormal; Notable for the following components:   D-Dimer, Quant 2.86 (*)    All other components within normal limits  I-STAT CHEM 8, ED - Abnormal; Notable for the following components:   BUN 40 (*)    Creatinine, Ser 1.30 (*)    Glucose, Bld 119 (*)    Hemoglobin 11.6 (*)    HCT 34.0 (*)    All other components within normal limits  TROPONIN T, HIGH SENSITIVITY - Abnormal; Notable for the following components:   Troponin T High Sensitivity 55 (*)    All other components within normal limits  TROPONIN T, HIGH SENSITIVITY - Abnormal; Notable for the following components:   Troponin T High Sensitivity 54 (*)    All other components within normal limits  HEPARIN  LEVEL  (UNFRACTIONATED)  BASIC METABOLIC PANEL WITH GFR  CBC  HEPARIN  LEVEL (UNFRACTIONATED)    EKG: EKG Interpretation Date/Time:  Monday May 31 2024 07:46:05 EST Ventricular Rate:  81 PR Interval:  138 QRS Duration:  131 QT Interval:  411 QTC Calculation: 478 R Axis:   71  Text Interpretation: Sinus tachycardia Paired ventricular premature complexes LVH with secondary repolarization abnormality Anterior infarct, old Artifact  in lead(s) III aVR aVF V1 V2 V3 V4 V5 V6 Confirmed by Yolande Charleston [phone]) on 05/31/2024 8:12:47 AM  Radiology: VAS US  LOWER EXTREMITY VENOUS (DVT) (ONLY [facility] & [facility]) Result Date: 05/31/2024  Lower Venous DVT Study Patient Name:  ALEASE FAIT  Date of Exam:   05/31/2024 Medical Rec #: [id number]      Accession #:    [id number] Date of Birth: 09-02-1922      Patient Gender: F Patient Age:   9 years Exam Location:  [facility] Procedure:      VAS US  LOWER EXTREMITY VENOUS (DVT) Referring Phys: CHARLESTON Cabela Pacifico --------------------------------------------------------------------------------  Indications: Pain.  Risk Factors: Recent left femur fracture s/p surgical repair on 05/11/2024. Comparison Study: No previous LLEV exams Performing Technologist: Jody Hill RVT, RDMS  Examination Guidelines: A complete evaluation includes B-mode imaging, spectral Doppler, color Doppler, and power Doppler as needed of all accessible portions of each vessel. Bilateral testing is considered an integral part of a complete examination. Limited examinations for reoccurring indications may be performed as noted. The reflux portion of the exam is performed with the patient in reverse Trendelenburg.  +-----+---------------+---------+-----------+----------+--------------+ RIGHTCompressibilityPhasicitySpontaneityPropertiesThrombus Aging +-----+---------------+---------+-----------+----------+--------------+ CFV  Full           Yes      Yes                                  +-----+---------------+---------+-----------+----------+--------------+   +---------+---------------+---------+-----------+----------+-------------------+ LEFT     CompressibilityPhasicitySpontaneityPropertiesThrombus Aging      +---------+---------------+---------+-----------+----------+-------------------+ CFV      Full           Yes      Yes                                      +---------+---------------+---------+-----------+----------+-------------------+ SFJ      Full                                                             +---------+---------------+---------+-----------+----------+-------------------+ FV Prox  Full           Yes      Yes                                      +---------+---------------+---------+-----------+----------+-------------------+ FV Mid   Full           Yes      Yes                                      +---------+---------------+---------+-----------+----------+-------------------+ FV DistalFull           Yes      Yes                                      +---------+---------------+---------+-----------+----------+-------------------+ PFV      Full                                                             +---------+---------------+---------+-----------+----------+-------------------+  POP      Full           Yes      Yes                                      +---------+---------------+---------+-----------+----------+-------------------+ PTV      Full                                         Not well visualized +---------+---------------+---------+-----------+----------+-------------------+ PERO     Full                                         Not well visualized +---------+---------------+---------+-----------+----------+-------------------+    Summary: RIGHT: - No evidence of common femoral vein obstruction.   LEFT: - There is no evidence of deep vein thrombosis in the lower extremity.  - No cystic structure  found in the popliteal fossa. Subcutaneous edema in area of the calf.  *See table(s) above for measurements and observations. Electronically signed by Penne Colorado MD on 05/31/2024 at 4:10:03 PM.    Final    CT Angio Chest PE W and/or Wo Contrast Result Date: 05/31/2024 CLINICAL DATA:  Positive D-dimer, chest pain. EXAM: CT ANGIOGRAPHY CHEST WITH CONTRAST TECHNIQUE: Multidetector CT imaging of the chest was performed using the standard protocol during bolus administration of intravenous contrast. Multiplanar CT image reconstructions and MIPs were obtained to evaluate the vascular anatomy. RADIATION DOSE REDUCTION: This exam was performed according to the departmental dose-optimization program which includes automated exposure control, adjustment of the mA and/or kV according to patient size and/or use of iterative reconstruction technique. CONTRAST:  75mL OMNIPAQUE  IOHEXOL  350 MG/ML SOLN COMPARISON:  None Available. FINDINGS: Cardiovascular: Subsegmental pulmonary arterial filling defect in the right upper lobe (8/64-68). Segmental/subsegmental pulmonary arterial filling defects in the left lower lobe (8/125-138). Atherosclerotic calcification of the aorta, aortic valve and coronary arteries. Heart is enlarged with left ventricular hypertrophy. No pericardial effusion. Mediastinum/Nodes: No pathologically enlarged mediastinal, hilar or axillary lymph nodes. Air and food debris in the esophagus, indicative of dysmotility. Lungs/Pleura: Small right and trace left pleural effusions with subsegmental volume loss in both lower lobes. Lungs are otherwise clear. Airway is unremarkable. Upper Abdomen: Cholecystectomy. Thickening of the left adrenal gland. No specific follow-up necessary. Large hiatal hernia. Visualized portions of the liver, adrenal glands, kidneys, spleen, pancreas, stomach and bowel are otherwise grossly unremarkable. No upper abdominal adenopathy. Musculoskeletal: Osteopenia.  Degenerative changes in  the spine. Review of the MIP images confirms the above findings. IMPRESSION: 1. A few scattered segmental/subsegmental pulmonary emboli in the right upper and left lower lobes. Critical Value/emergent results were called by telephone at the time of interpretation on 05/31/2024 at 9:22 am to provider [facility] , who verbally acknowledged these results. 2. Small right and trace left pleural effusions. 3. Large hiatal hernia. 4. Aortic atherosclerosis (ICD10-I70.0). Coronary artery calcification. Electronically Signed   By: Newell Eke M.D.   On: 05/31/2024 09:23   DG CHEST PORT 1 VIEW Result Date: 05/31/2024 CLINICAL DATA:  Chest pain EXAM: PORTABLE CHEST 1 VIEW COMPARISON:  05/10/2024 FINDINGS: The lungs are clear without focal pneumonia, edema, pneumothorax or pleural effusion. Interstitial markings are diffusely coarsened with chronic features.  The cardio pericardial silhouette is enlarged. Bones are diffusely demineralized. Telemetry leads overlie the chest. IMPRESSION: Chronic interstitial coarsening without acute cardiopulmonary findings. Electronically Signed   By: Camellia Candle M.D.   On: 05/31/2024 08:34     Procedures   Medications Ordered in the ED  diphenhydrAMINE  (BENADRYL ) capsule 50 mg (50 mg Oral Not Given 05/31/24 1101)    Or  diphenhydrAMINE  (BENADRYL ) injection 50 mg ( Intravenous See Alternative 05/31/24 1101)  ipratropium-albuterol  (DUONEB) 0.5-2.5 (3) MG/3ML nebulizer solution 3 mL (has no administration in time range)  pantoprazole  (PROTONIX ) EC tablet 40 mg (40 mg Oral Given 05/31/24 1723)  senna (SENOKOT) tablet 8.6 mg (has no administration in time range)  torsemide  (DEMADEX ) tablet 20 mg (20 mg Oral Given 05/31/24 1723)  fluticasone  furoate-vilanterol (BREO ELLIPTA ) 100-25 MCG/ACT 1 puff (1 puff Inhalation Not Given 05/31/24 1600)  polyethylene glycol (MIRALAX  / GLYCOLAX ) packet 34 g (34 g Oral Given 05/31/24 1723)  oxyCODONE  (Oxy IR/ROXICODONE ) immediate release tablet  2.5 mg (has no administration in time range)  heparin  ADULT infusion 100 units/mL (25000 units/250mL) (800 Units/hr Intravenous New Bag/Given 05/31/24 1332)  amLODipine  (NORVASC ) tablet 2.5 mg (has no administration in time range)  carvedilol  (COREG ) tablet 3.125 mg (has no administration in time range)  methylPREDNISolone  sodium succinate (SOLU-MEDROL ) 40 mg/mL injection 40 mg (40 mg Intravenous Given 05/31/24 0830)  iohexol  (OMNIPAQUE ) 350 MG/ML injection 75 mL (75 mLs Intravenous Contrast Given 05/31/24 0845)  heparin  bolus via infusion 2,000 Units (2,000 Units Intravenous Bolus from Bag 05/31/24 1332)                                    Medical Decision Making Amount and/or Complexity of Data Reviewed Labs: ordered. Radiology: ordered.  Risk Prescription drug management. Decision regarding hospitalization.   Anna Barrett is a 89 year old female history of CHF, CAD, atrial fibrillation not on anticoagulation, and recent left-sided hip fracture status post IMN on 05/11/2024 who presents to the emergency department chest pain.    Initial Ddx:  PE, MI, pericarditis, chest wall pain  MDM/Course:  Patient presents emergency department with chest pain.  Recently had IMN.  Is reportedly on Lovenox  prophylaxis for this.  On exam is not in acute distress.  She denies any pain to me.  Satting well on room air.  Because of her recent surgery had a D-dimer that was sent that was found to be elevated.  She underwent CTA that shows bilateral PE of her segmental and subsegmental.  No right heart strain on her CTA.  Her troponin is elevated in the mid 50s but flat.  EKG without acute ischemic changes.  Upon re-evaluation patient remained stable.  Based on PE severity index is high risk so I did perform shared decision making with her and her family especially because of her age so that we could come up with a plan that was appropriate for her.  They feel that it would best for her to be admitted at this  point in time so I discussed this with the hospitalist who started her on heparin   This patient presents to the ED for concern of complaints listed in HPI, this involves an extensive number of treatment options, and is a complaint that carries with it a high risk of complications and morbidity. Disposition including potential need for admission considered.   Dispo: Admit to Floor  I have reviewed the patients home medications  and made adjustments as needed Additional history obtained from son Records reviewed Outpatient Clinic Notes The following labs were independently interpreted: Chemistry and show no acute abnormality I independently reviewed the following imaging with scope of interpretation limited to determining acute life threatening conditions related to emergency care: Chest x-ray and agree with the radiologist interpretation with the following exceptions: none I personally reviewed and interpreted cardiac monitoring: atrial fibrillation (normal rate) I personally reviewed and interpreted the pt's EKG: see above for interpretation  Consults: Hospitalist Social Determinants of health:  Geriatric  Portions of this note were generated with Scientist, clinical (histocompatibility and immunogenetics). Dictation errors may occur despite best attempts at proofreading.     Final diagnoses:  Other acute pulmonary embolism without acute cor pulmonale (HCC)  Chest pain, unspecified type  Elevated troponin    ED Discharge Orders     None          Yolande Lamar BROCKS, MD 05/31/24 1821  "

## 2024-06-01 ENCOUNTER — Observation Stay (HOSPITAL_COMMUNITY)

## 2024-06-01 DIAGNOSIS — I2699 Other pulmonary embolism without acute cor pulmonale: Secondary | ICD-10-CM | POA: Diagnosis not present

## 2024-06-01 DIAGNOSIS — I5022 Chronic systolic (congestive) heart failure: Secondary | ICD-10-CM | POA: Diagnosis present

## 2024-06-01 DIAGNOSIS — Z90710 Acquired absence of both cervix and uterus: Secondary | ICD-10-CM | POA: Diagnosis not present

## 2024-06-01 DIAGNOSIS — R079 Chest pain, unspecified: Secondary | ICD-10-CM | POA: Diagnosis not present

## 2024-06-01 DIAGNOSIS — J45909 Unspecified asthma, uncomplicated: Secondary | ICD-10-CM | POA: Diagnosis present

## 2024-06-01 DIAGNOSIS — I130 Hypertensive heart and chronic kidney disease with heart failure and stage 1 through stage 4 chronic kidney disease, or unspecified chronic kidney disease: Secondary | ICD-10-CM | POA: Diagnosis present

## 2024-06-01 DIAGNOSIS — Z79899 Other long term (current) drug therapy: Secondary | ICD-10-CM | POA: Diagnosis not present

## 2024-06-01 DIAGNOSIS — N1831 Chronic kidney disease, stage 3a: Secondary | ICD-10-CM | POA: Diagnosis present

## 2024-06-01 DIAGNOSIS — R627 Adult failure to thrive: Secondary | ICD-10-CM | POA: Diagnosis present

## 2024-06-01 DIAGNOSIS — I255 Ischemic cardiomyopathy: Secondary | ICD-10-CM | POA: Diagnosis present

## 2024-06-01 DIAGNOSIS — I2510 Atherosclerotic heart disease of native coronary artery without angina pectoris: Secondary | ICD-10-CM | POA: Diagnosis present

## 2024-06-01 DIAGNOSIS — I252 Old myocardial infarction: Secondary | ICD-10-CM | POA: Diagnosis not present

## 2024-06-01 DIAGNOSIS — Z66 Do not resuscitate: Secondary | ICD-10-CM | POA: Diagnosis present

## 2024-06-01 DIAGNOSIS — I2694 Multiple subsegmental pulmonary emboli without acute cor pulmonale: Secondary | ICD-10-CM | POA: Diagnosis present

## 2024-06-01 DIAGNOSIS — E785 Hyperlipidemia, unspecified: Secondary | ICD-10-CM | POA: Diagnosis present

## 2024-06-01 DIAGNOSIS — I4891 Unspecified atrial fibrillation: Secondary | ICD-10-CM | POA: Diagnosis present

## 2024-06-01 DIAGNOSIS — K5900 Constipation, unspecified: Secondary | ICD-10-CM | POA: Diagnosis present

## 2024-06-01 DIAGNOSIS — K219 Gastro-esophageal reflux disease without esophagitis: Secondary | ICD-10-CM | POA: Diagnosis present

## 2024-06-01 DIAGNOSIS — M810 Age-related osteoporosis without current pathological fracture: Secondary | ICD-10-CM | POA: Diagnosis present

## 2024-06-01 DIAGNOSIS — Z8249 Family history of ischemic heart disease and other diseases of the circulatory system: Secondary | ICD-10-CM | POA: Diagnosis not present

## 2024-06-01 LAB — CBC
HCT: 29.6 % — ABNORMAL LOW (ref 36.0–46.0)
Hemoglobin: 9.5 g/dL — ABNORMAL LOW (ref 12.0–15.0)
MCH: 31.5 pg (ref 26.0–34.0)
MCHC: 32.1 g/dL (ref 30.0–36.0)
MCV: 98 fL (ref 80.0–100.0)
Platelets: 339 K/uL (ref 150–400)
RBC: 3.02 MIL/uL — ABNORMAL LOW (ref 3.87–5.11)
RDW: 15.9 % — ABNORMAL HIGH (ref 11.5–15.5)
WBC: 6.4 K/uL (ref 4.0–10.5)
nRBC: 0 % (ref 0.0–0.2)

## 2024-06-01 LAB — ECHOCARDIOGRAM COMPLETE
AR max vel: 2.61 cm2
AV Area VTI: 2.45 cm2
AV Area mean vel: 2.62 cm2
AV Mean grad: 3 mmHg
AV Peak grad: 5.7 mmHg
Ao pk vel: 1.2 m/s
Area-P 1/2: 3.48 cm2
Height: 59 in
MV M vel: 1.51 m/s
MV Peak grad: 9.1 mmHg
MV VTI: 1.5 cm2
S' Lateral: 2.6 cm
Weight: 1601.42 [oz_av]

## 2024-06-01 LAB — BASIC METABOLIC PANEL WITH GFR
Anion gap: 7 (ref 5–15)
BUN: 43 mg/dL — ABNORMAL HIGH (ref 8–23)
CO2: 27 mmol/L (ref 22–32)
Calcium: 8.4 mg/dL — ABNORMAL LOW (ref 8.9–10.3)
Chloride: 105 mmol/L (ref 98–111)
Creatinine, Ser: 1.22 mg/dL — ABNORMAL HIGH (ref 0.44–1.00)
GFR, Estimated: 40 mL/min — ABNORMAL LOW
Glucose, Bld: 98 mg/dL (ref 70–99)
Potassium: 4.7 mmol/L (ref 3.5–5.1)
Sodium: 139 mmol/L (ref 135–145)

## 2024-06-01 LAB — HEPARIN LEVEL (UNFRACTIONATED): Heparin Unfractionated: 0.5 [IU]/mL (ref 0.30–0.70)

## 2024-06-01 MED ORDER — OXYCODONE HCL 5 MG PO TABS: 2.5000 mg | ORAL_TABLET | Freq: Three times a day (TID) | ORAL | Status: DC | PRN

## 2024-06-01 MED ORDER — FLUTICASONE FUROATE-VILANTEROL 100-25 MCG/ACT IN AEPB
1.0000 | INHALATION_SPRAY | Freq: Every day | RESPIRATORY_TRACT | Status: DC
  Administered 2024-06-01 – 2024-06-03 (×3): 1 via RESPIRATORY_TRACT
  Filled 2024-06-01: qty 28

## 2024-06-01 MED ORDER — ACETAMINOPHEN 325 MG PO TABS
650.0000 mg | ORAL_TABLET | Freq: Three times a day (TID) | ORAL | Status: DC
  Administered 2024-06-01 – 2024-06-04 (×10): 650 mg via ORAL
  Filled 2024-06-01 (×10): qty 2

## 2024-06-01 NOTE — Hospital Course (Addendum)
 Anna Barrett is a 89 y.o. female with PMH of sCHF with EF 35-40%, HTN, HLD, CAD, MI, asthma, GERD, CKD-3A, A-fib not on anticoagulants, anemia, and recent admission for L hip fx s/p repair on 12/30 who p/w chest pain and found to have bilateral subsegmental pulmonary emboli.IV hep gtt started per pharmacy .  Patient has been monitored on IV heparin  overall tolerating well hemoglobin remains stable.  Echo obtained showing EF 45-50%, G1 DD RV SF is normal RV size is normal compared to prior echo LVEF has improved.  Seen by PT OT recommending skilled nursing facility  Subjective: Seen and examined today Patient discharge held yesterday at the SNF could not take her, will be discharged 1/23 No acute events overnight  Discharge Diagnoses:   Acute B/L subsegmental pulmonary emboli: Echo obtained showing EF 45-50%, G1 DD RV SF is normal RV size is normal compared to prior echo LVEF has improved.  Managed with heparin  drip protocol, transitioned to eliquis  and remains hemodynamically stable on RA. SHE IS AT FALL RISK but at this time agreeable for DOAC, hopefully can come off in 3 months.   FTT: Recent wt loss and was struggling to participate with PTOT at SNF. She is DNR. She will benefit with Palliative eval.  A fib: Previously on warfarin but discontinued due to bruising per Epic review.Continue her amlodipine  and Coreg .   HFrEF HTN: Blood pressure well-controlled, currently euvolemic, continue Coreg  and torsemide .EF seems to be improving.   GERD: Continue PPI   Back pain: Continue tylenol  tid scheduled and keep oxycodone  2.5mg  TID prn for sever pain only.   Constipation: Stable, continue senna, last BM 1/18  CKD 3A: Baseline creatinine around 1.2 Mobility: PT Orders: Active PT Follow up Rec: Skilled Nursing-Short Term Rehab (<3 Hours/Day)06/03/2024 1316   DVT prophylaxis:  Code Status:   Code Status: Limited: Do not attempt resuscitation (DNR) -DNR-LIMITED -Do Not Intubate/DNI   Family Communication: plan of care discussed with patient/son at bedside. Patient status is: Remains hospitalized because of severity of illness Level of care: Telemetry   Dispo: The patient is from: SNF            Anticipated disposition:  SNF once Auth obtained Objective: Vitals last 24 hrs: Vitals:   06/03/24 1606 06/03/24 1949 06/04/24 0441 06/04/24 0756  BP: (!) 121/58 136/61 (!) 139/58 (!) 142/54  Pulse: 68  71 75  Resp: 20 (!) 21    Temp: 98.4 F (36.9 C) (!) 97.5 F (36.4 C) 97.8 F (36.6 C) (!) 97.3 F (36.3 C)  TempSrc: Oral Oral Oral Oral  SpO2: 97% 96% 97% 98%  Weight:      Height:       Physical Examination: General exam: AAOX3, HEENT:Oral mucosa moist, Ear/Nose WNL grossly Respiratory system: cta Bilaterally Cardiovascular system: S1 & S2 +, No JVD. Gastrointestinal system: Abdomen soft,NT,ND, BS+ Nervous System: Alert, awake, moving all extremities,and following commands. Extremities: extremities warm, leg edema NEG left lef incision site healed Skin: Warm, no rashes MSK: Normal muscle bulk,tone, power

## 2024-06-01 NOTE — Progress Notes (Signed)
 PT Cancellation Note  Patient Details Name: Anna Barrett MRN: [id number] DOB: February 03, 1923   Cancelled Treatment:    Reason Eval/Treat Not Completed: Other (comment); attempted twice this am, first pt needing medication and second attempt pt just finished with OT.  Will follow up as able.    Montie Portal 06/01/2024, 12:08 PM Micheline Portal, PT Acute Rehabilitation Services Office:[phone] 06/01/2024

## 2024-06-01 NOTE — Evaluation (Signed)
 Physical Therapy Evaluation Patient Details Name: Anna Barrett MRN: [id number] DOB: 09/20/1922 Today's Date: 06/01/2024  History of Present Illness  89 y.o. female who presented with chest pain. Found to have bilateral subsegmental pulmonary emboli. PMHx: 12/30 left hip cephalomedullary nailing, CHF with EF 35-40%, HTN,  HLD, CAD, MI, asthma, GERD, CKD-3A, A-fib not on anticoagulants, anemia  Clinical Impression  Patient presents with decreased mobility due to pain and limited activity tolerance, generalized weakness with L hip pain and history of falling.  Previously mobilizing at SNF with A and today able to ambulate in the room with RW and min A with fair tolerance to weight bearing.  She will benefit from skilled PT in the acute setting, and from post-acute inpatient rehab (<3  hours/day) prior to home with family support.         If plan is discharge home, recommend the following: A little help with walking and/or transfers;Assistance with cooking/housework;A little help with bathing/dressing/bathroom;Help with stairs or ramp for entrance;Assist for transportation;Direct supervision/assist for medications management;Supervision due to cognitive status   Can travel by private vehicle        Equipment Recommendations None recommended by PT  Recommendations for Other Services       Functional Status Assessment Patient has had a recent decline in their functional status and demonstrates the ability to make significant improvements in function in a reasonable and predictable amount of time.     Precautions / Restrictions Precautions Precautions: Fall Recall of Precautions/Restrictions: Impaired Restrictions LLE Weight Bearing Per Provider Order: Weight bearing as tolerated      Mobility  Bed Mobility Overal bed mobility: Needs Assistance Bed Mobility: Supine to Sit, Sit to Supine     Supine to sit: Min assist   Sit to sidelying: Min assist General bed mobility comments: A  for L LE and scooting hips    Transfers Overall transfer level: Needs assistance Equipment used: Rolling walker (2 wheels) Transfers: Sit to/from Stand Sit to Stand: Min assist           General transfer comment: assist for balance and cues for hand placement, repeated x 5 for LE strengthening    Ambulation/Gait Ambulation/Gait assistance: Contact guard assist Gait Distance (Feet): 15 Feet Assistive device: Rolling walker (2 wheels) Gait Pattern/deviations: Step-to pattern, Decreased stride length, Antalgic       General Gait Details: in room to window then back to bed with A for balance/walker safety with cues  Stairs            Wheelchair Mobility     Tilt Bed    Modified Rankin (Stroke Patients Only)       Balance Overall balance assessment: Needs assistance   Sitting balance-Leahy Scale: Fair     Standing balance support: Bilateral upper extremity supported Standing balance-Leahy Scale: Poor Standing balance comment: Static standing at RW with ModA                             Pertinent Vitals/Pain Pain Assessment Faces Pain Scale: Hurts little more Pain Location: L hip Pain Descriptors / Indicators: Grimacing, Guarding, Sore, Tender Pain Intervention(s): Monitored during session, Repositioned    Home Living Family/patient expects to be discharged to:: Skilled nursing facility                   Additional Comments: prior to recent hip sx and SNF discharge, pt was living at home with 24/7 care from  family.    Prior Function Prior Level of Function : Needs assist;History of Falls (last six months)             Mobility Comments: Working with PT at [facility], walked ~53ft with RW. Otherwise staff has been using wc for mobility ADLs Comments: Staff at SNF assisting wtih all aspects of ADLs. Using bried at [facility]. Prior to hip sx, pt was mostly mod I for ADLs, family assisted with showering     Extremity/Trunk Assessment   Upper  Extremity Assessment Upper Extremity Assessment: Defer to OT evaluation    Lower Extremity Assessment Lower Extremity Assessment: RLE deficits/detail;LLE deficits/detail RLE Deficits / Details: WFL AROM generalized weakness LLE Deficits / Details: AAROM limited hip flexion, knee extension 3/5, ankle DF 4/5       Communication   Communication Communication: Impaired Factors Affecting Communication: Hearing impaired    Cognition Arousal: Alert Behavior During Therapy: WFL for tasks assessed/performed   PT - Cognitive impairments: History of cognitive impairments, Memory, Sequencing                       PT - Cognition Comments: grandaughter present to remind her about plans for rehab prior to home Following commands: Impaired Following commands impaired: Only follows one step commands consistently, Follows one step commands with increased time     Cueing       General Comments General comments (skin integrity, edema, etc.): VSS on RA despite mild SOB after scooting up in bed    Exercises General Exercises - Lower Extremity Ankle Circles/Pumps: AROM, Both, 10 reps, Supine Short Arc Quad: AROM, Both, 10 reps, Supine Heel Slides: AROM, AAROM, Both, 10 reps, Supine Hip ABduction/ADduction: AROM, Both, 10 reps, Supine   Assessment/Plan    PT Assessment Patient needs continued PT services  PT Problem List Decreased strength;Decreased activity tolerance;Decreased balance;Decreased mobility;Decreased knowledge of use of DME       PT Treatment Interventions DME instruction;Gait training;Functional mobility training;Therapeutic activities;Therapeutic exercise;Balance training;Neuromuscular re-education;Patient/family education    PT Goals (Current goals can be found in the Care Plan section)  Acute Rehab PT Goals Patient Stated Goal: return to SNF prior to home PT Goal Formulation: With family Time For Goal Achievement: 06/15/24 Potential to Achieve Goals: Fair     Frequency Min 2X/week     Co-evaluation               AM-PAC PT 6 Clicks Mobility  Outcome Measure Help needed turning from your back to your side while in a flat bed without using bedrails?: A Little Help needed moving from lying on your back to sitting on the side of a flat bed without using bedrails?: A Little Help needed moving to and from a bed to a chair (including a wheelchair)?: A Little Help needed standing up from a chair using your arms (e.g., wheelchair or bedside chair)?: A Little Help needed to walk in hospital room?: A Lot Help needed climbing 3-5 steps with a railing? : Total 6 Click Score: 15    End of Session Equipment Utilized During Treatment: Gait belt Activity Tolerance: Patient tolerated treatment well Patient left: in bed;with call bell/phone within reach;with family/visitor present;with bed alarm set   PT Visit Diagnosis: Other abnormalities of gait and mobility (R26.89);Pain;History of falling (Z91.81);Muscle weakness (generalized) (M62.81) Pain - Right/Left: Left Pain - part of body: Hip    Time: 8494-8469 PT Time Calculation (min) (ACUTE ONLY): 25 min   Charges:   PT  Evaluation $PT Eval Moderate Complexity: 1 Mod PT Treatments $Therapeutic Activity: 8-22 mins PT General Charges $$ ACUTE PT VISIT: 1 Visit         Micheline Portal, PT Acute Rehabilitation Services Office:[phone] 06/01/2024   Montie Portal 06/01/2024, 5:07 PM

## 2024-06-01 NOTE — Plan of Care (Signed)
  Problem: Education: Goal: Knowledge of General Education information will improve Description: Including pain rating scale, medication(s)/side effects and non-pharmacologic comfort measures Outcome: Progressing   Problem: Activity: Goal: Risk for activity intolerance will decrease Outcome: Progressing   Problem: Nutrition: Goal: Adequate nutrition will be maintained Outcome: Progressing   Problem: Elimination: Goal: Will not experience complications related to bowel motility Outcome: Progressing Goal: Will not experience complications related to urinary retention Outcome: Progressing   Problem: Pain Managment: Goal: General experience of comfort will improve and/or be controlled Outcome: Progressing   Problem: Safety: Goal: Ability to remain free from injury will improve Outcome: Progressing

## 2024-06-01 NOTE — Care Management Obs Status (Signed)
 MEDICARE OBSERVATION STATUS NOTIFICATION   Patient Details  Name: Anna Barrett MRN: [id number] Date of Birth: 05/22/1922   Medicare Observation Status Notification Given:  Yes    Nena LITTIE Coffee, RN 06/01/2024, 12:09 PM

## 2024-06-01 NOTE — Progress Notes (Addendum)
 " PROGRESS NOTE FRANSHESKA WILLINGHAM  FMW:[id number] DOB: May 21, 1922 DOA: 05/31/2024 PCP: Lenon Layman ORN, MD  Brief Narrative/Hospital Course: BRANNA CORTINA is a 89 y.o. female with PMH of sCHF with EF 35-40%, HTN, HLD, CAD, MI, asthma, GERD, CKD-3A, A-fib not on anticoagulants, anemia, and recent admission for L hip fx s/p repair on 12/30 who p/w chest pain and found to have bilateral subsegmental pulmonary emboli.IV hep gtt started per pharmacy    Subjective: Seen and examined today Co some mid back pain bilaterally son and family at bedsid Overnight on RA , afebrile, VSS, Labs-stable CBC BMP with creatinine elevated 1.2 troponin 55> 54, mild anemia with hemoglobin 11> 9g  Assessment and plan:  Acute b/l subsegmental pulmonary emboli Continue heparin  drip for now, order limited echocardiogram. Mild back pain upper chest posteriorly otherwise vital stable on room air   Afib Previously on warfarin but discontinued due to bruising per Epic review Continue her amlodipine  and Coreg .   HFrEF HTN Blood pressure well-controlled, currently euvolemic, continue Coreg  and torsemide    GERD Continue PPI   Back pain Resume tylenol  tid scheduled and keep oxycodone  2.5mg  TID prn for sever pain only.   Constipation Stable, continue senna  CKD 3A: Baseline creatinine around 1, monitor Recent Labs    10/30/23 0500 05/10/24 2237 05/11/24 0829 05/12/24 0605 05/13/24 1126 05/14/24 0503 05/15/24 0457 05/17/24 0545 05/31/24 0744 05/31/24 0755 06/01/24 0509  BUN 44* 49* 51* 49* 57* 52* 47* 42* 39* 40* 43*  CREATININE 1.26* 1.34* 1.48* 1.86* 1.75* 1.38* 1.22* 1.01* 1.22* 1.30* 1.22*  CO2 28 28 31 29 29 28 28 28 25   --  27  K 3.8 4.0 5.3* 4.6 4.1 4.2 4.6 5.0 4.8 4.7 4.7     Mobility: PT Orders: Active PT Follow up Rec:    DVT prophylaxis:  Code Status:   Code Status: Limited: Do not attempt resuscitation (DNR) -DNR-LIMITED -Do Not Intubate/DNI  Family Communication: plan of care  discussed with patient/son at bedside. Patient status is: Remains hospitalized because of severity of illness Level of care: Telemetry   Dispo: The patient is from: SNF            Anticipated disposition: TBD Objective: Vitals last 24 hrs: Vitals:   05/31/24 1922 06/01/24 0534 06/01/24 0747 06/01/24 0940  BP: (!) 136/98 (!) 137/48 (!) 157/66 (!) 157/66  Pulse: 80 67 75 75  Resp: 18 18 18    Temp: 98.6 F (37 C) 98.4 F (36.9 C) (!) 97.4 F (36.3 C)   TempSrc: Oral  Oral   SpO2: 97% 92% 96%   Weight:      Height:        Physical Examination: General exam: alert awake, oriented, older than stated age HEENT:Oral mucosa moist, Ear/Nose WNL grossly Respiratory system: Bilaterally clear BS,no use of accessory muscle Cardiovascular system: S1 & S2 +, No JVD. Gastrointestinal system: Abdomen soft,NT,ND, BS+ Nervous System: Alert, awake, moving all extremities,and following commands. Extremities: extremities warm, leg edema NEG left lef incision site healed Skin: Warm, no rashes MSK: Normal muscle bulk,tone, power   Medications reviewed:  Scheduled Meds:  acetaminophen   650 mg Oral TID   amLODipine   2.5 mg Oral Daily   carvedilol   3.125 mg Oral BID WC   diphenhydrAMINE   50 mg Oral Once   Or   diphenhydrAMINE   50 mg Intravenous Once   fluticasone  furoate-vilanterol  1 puff Inhalation Q2000   pantoprazole   40 mg Oral Daily   polyethylene glycol  34 g Oral Daily   senna  1 tablet Oral BID   torsemide   20 mg Oral Daily   Continuous Infusions:  heparin  900 Units/hr (06/01/24 0309)   Diet: Diet Order             Diet regular Room service appropriate? Yes; Fluid consistency: Thin  Diet effective now                     Unresulted Labs (From admission, onward)     Start     Ordered   06/01/24 0500  Heparin  level (unfractionated)  Daily,   R      05/31/24 1228   06/01/24 0500  CBC  Daily,   R      05/31/24 1228           Data Reviewed: I have personally  reviewed following labs and imaging studies ( see epic result tab) CBC: Recent Labs  Lab 05/31/24 0744 05/31/24 0755 06/01/24 0509  WBC 5.7  --  6.4  HGB 10.8* 11.6* 9.5*  HCT 33.7* 34.0* 29.6*  MCV 100.6*  --  98.0  PLT 398  --  339   CMP: Recent Labs  Lab 05/31/24 0744 05/31/24 0755 06/01/24 0509  NA 137 141 139  K 4.8 4.7 4.7  CL 103 104 105  CO2 25  --  27  GLUCOSE 120* 119* 98  BUN 39* 40* 43*  CREATININE 1.22* 1.30* 1.22*  CALCIUM 8.9  --  8.4*   GFR: Estimated Creatinine Clearance: 17.1 mL/min (A) (by C-G formula based on SCr of 1.22 mg/dL (H)). No results for input(s): AST, ALT, ALKPHOS, BILITOT, PROT, ALBUMIN in the last 168 hours. No results for input(s): LIPASE, AMYLASE in the last 168 hours. No results for input(s): AMMONIA in the last 168 hours. Coagulation Profile: No results for input(s): INR, PROTIME in the last 168 hours. Antimicrobials/Microbiology: Anti-infectives (From admission, onward)    None         Component Value Date/Time   SDES BLOOD LEFT WRIST 10/28/2023 1834   SDES RIGHT ANTECUBITAL 10/28/2023 1834   SPECREQUEST  10/28/2023 1834    BOTTLES DRAWN AEROBIC AND ANAEROBIC Blood Culture results may not be optimal due to an inadequate volume of blood received in culture bottles   SPECREQUEST  10/28/2023 1834    BOTTLES DRAWN AEROBIC AND ANAEROBIC Blood Culture adequate volume   CULT  10/28/2023 1834    NO GROWTH 5 DAYS Performed at [facility], [address] [place], [place] [address]    CULT  10/28/2023 1834    NO GROWTH 5 DAYS Performed at [facility], [address] [place], [place] [address]    REPTSTATUS 11/02/2023 FINAL 10/28/2023 1834   REPTSTATUS 11/02/2023 FINAL 10/28/2023 1834    Procedures:    Mennie LAMY, MD [facility] 06/01/2024, 12:42 PM   "

## 2024-06-01 NOTE — Progress Notes (Addendum)
 PHARMACY - ANTICOAGULATION CONSULT NOTE  Pharmacy Consult for IV heparin   Indication: pulmonary embolus  Allergies[1]  Patient Measurements: Height: 4' 11 (149.9 cm) Weight: 45.4 kg (100 lb 1.4 oz) IBW/kg (Calculated) : 43.2 HEPARIN  DW (KG): 45.4  Vital Signs: Temp: 97.4 F (36.3 C) (01/20 0747) Temp Source: Oral (01/20 0747) BP: 157/66 (01/20 0940) Pulse Rate: 75 (01/20 0940)  Labs: Recent Labs    05/31/24 0744 05/31/24 0755 05/31/24 1759 06/01/24 0509  HGB 10.8* 11.6*  --  9.5*  HCT 33.7* 34.0*  --  29.6*  PLT 398  --   --  339  HEPARINUNFRC  --   --  0.33 0.50  CREATININE 1.22* 1.30*  --  1.22*    Estimated Creatinine Clearance: 17.1 mL/min (A) (by C-G formula based on SCr of 1.22 mg/dL (H)).   Medical History: Past Medical History:  Diagnosis Date   Asthma    Carotid stenosis    Headache    MIGRAINES   History of MI (myocardial infarction)    Hyperglycemia    Hyperlipidemia    Hypertension    Ischemic cardiomyopathy    Myocardial infarction [facility]) 1962   Osteoporosis    Parathyroid adenoma    Assessment: Anna Barrett is a 89 y.o. year old female admitted on 05/31/2024 with concern for PE. CTA with bilateral segmental/subsegmental PE in the right upper and left lower lobes (no RHS noted). On enoxaparin  30mg  prior to admission (last dose 1/18 1030). Pharmacy consulted to dose heparin . Of note: PMH includes A-fib not on anticoagulants, anemia, and recent admission for L hip fx s/p repair on 12/30   Heparin  level this morning is therapeutic at 0.5. Hgb/Plt down slightly to 9.5/339.  Per RN, no known issues with infusion or bleeding.   Initial heparin  level 0.33  Goal of Therapy:  Heparin  level 0.3-0.7 units/ml Monitor platelets by anticoagulation protocol: Yes   Plan:  Continue heparin  to 900 units/hr  Daily heparin  level, CBC, and monitoring for bleeding   Thank you for allowing pharmacy to be a part of this patients care.   Alizah Sills C Seville Downs,  PharmD 06/01/2024 9:45 AM  **Pharmacist phone directory can be found on [email or web address] listed under [facility]**       [1]  Allergies Allergen Reactions   Shellfish Allergy Hives   Iodine Other (See Comments)    Hx shellfish allergy   Lipitor [Atorvastatin] Other (See Comments)    Unknown reaction

## 2024-06-01 NOTE — Progress Notes (Signed)
 Transition of Care [facility]) - Inpatient Brief Assessment  Patient Details  Name: Anna Barrett MRN: [id number] Date of Birth: Dec 22, 1922  Transition of Care [facility]) CM/SW Contact:    Nena LITTIE Coffee, RN Phone Number: 06/01/2024, 12:34 PM  Clinical Narrative: Pt from [facility] and plans to return there. Pt had a hip surgery 05/11/2024 and went to [facility] at that time. She plans to return home at the end of January or beginning of February. Once at home she will have family members stay with her around the clock.  Current DME: cane, walker, wc, raised toilet seat c/bars, shower c/seat and grab bars.    Transition of Care Asessment: Insurance and Status: (P) Insurance coverage has been reviewed Patient has primary care physician: (P) Yes Home environment has been reviewed: (P) Currently at [facility] Prior level of function:: (P) min assist Prior/Current Home Services: (P) No current home services Social Drivers of Health Review: (P) SDOH reviewed needs interventions Readmission risk has been reviewed: (P) Yes Transition of care needs: (P) transition of care needs identified, TOC will continue to follow

## 2024-06-01 NOTE — Evaluation (Signed)
 Occupational Therapy Evaluation Patient Details Name: Anna Barrett MRN: [id number] DOB: [birth year] Today's Date: 06/01/2024   History of Present Illness   89 y.o. female who presented with chest pain. Found to have bilateral subsegmental pulmonary emboli. PMHx: 12/30 left hip cephalomedullary nailing, CHF with EF 35-40%, HTN,  HLD, CAD, MI, asthma, GERD, CKD-3A, A-fib not on anticoagulants, anemia     Clinical Impressions Anna Barrett was evaluated s/p the above admission list. Prior to her recent L hip sx she was generally mod I for most mobility and ADLs but had 24/7 assist from family as needed. Since her SNF stay, staff has been assisting with all aspects of ADLs, she stands and pivots to a WC for most mobility and is working with PT/OT. Upon evaluation the pt was limited by LLE pain, unsteady balance, generalized weakness and poor activity tolerance. Overall she needed min A to stand and progressed to CGA for mobility with RW. Due to the deficits listed below the pt also needs mod A for LB ADLs and set up A for UB ADLs in sitting. Pt will benefit from continued acute OT services and skilled inpatient follow up therapy, <3 hours/day.      If plan is discharge home, recommend the following:   A little help with walking and/or transfers;Assistance with cooking/housework;A little help with bathing/dressing/bathroom     Functional Status Assessment   Patient has had a recent decline in their functional status and demonstrates the ability to make significant improvements in function in a reasonable and predictable amount of time.     Equipment Recommendations   None recommended by OT      Precautions/Restrictions   Precautions Precautions: Fall Recall of Precautions/Restrictions: Impaired Restrictions Weight Bearing Restrictions Per Provider Order: Yes LLE Weight Bearing Per Provider Order: Weight bearing as tolerated     Mobility Bed Mobility Overal bed mobility: Needs  Assistance Bed Mobility: Sit to Sidelying, Rolling Rolling: Supervision       Sit to sidelying: Min assist      Transfers Overall transfer level: Needs assistance Equipment used: Rolling walker (2 wheels) Transfers: Sit to/from Stand Sit to Stand: Min assist           General transfer comment: progressed to CGA for gait      Balance Overall balance assessment: Needs assistance Sitting-balance support: No upper extremity supported, Feet supported Sitting balance-Leahy Scale: Fair     Standing balance support: Bilateral upper extremity supported, During functional activity, Reliant on assistive device for balance Standing balance-Leahy Scale: Poor                             ADL either performed or assessed with clinical judgement   ADL Overall ADL's : Needs assistance/impaired Eating/Feeding: Independent   Grooming: Set up;Sitting   Upper Body Bathing: Set up;Sitting   Lower Body Bathing: Moderate assistance;Sit to/from stand   Upper Body Dressing : Set up;Sitting   Lower Body Dressing: Moderate assistance;Sit to/from stand   Toilet Transfer: Minimal assistance;Ambulation;Rolling walker (2 wheels)   Toileting- Clothing Manipulation and Hygiene: Contact guard assist;Sitting/lateral lean       Functional mobility during ADLs: Minimal assistance;Rolling walker (2 wheels) General ADL Comments: limited by activity tolerance, LLE pain and balance     Vision Baseline Vision/History: 1 Wears glasses Vision Assessment?: No apparent visual deficits     Perception Perception: Within Functional Limits       Praxis Praxis: [facility]  Pertinent Vitals/Pain Pain Assessment Pain Assessment: Faces Faces Pain Scale: Hurts little more Pain Location: L hip Pain Descriptors / Indicators: Aching, Crying, Grimacing, Guarding Pain Intervention(s): Limited activity within patient's tolerance, Monitored during session     Extremity/Trunk Assessment Upper  Extremity Assessment Upper Extremity Assessment: Generalized weakness   Lower Extremity Assessment Lower Extremity Assessment: Defer to PT evaluation   Cervical / Trunk Assessment Cervical / Trunk Assessment: Kyphotic   Communication Communication Communication: Impaired Factors Affecting Communication: Hearing impaired   Cognition Arousal: Alert Behavior During Therapy: WFL for tasks assessed/performed Cognition: No apparent impairments             OT - Cognition Comments: HOH but seemingly WFL                 Following commands: Impaired Following commands impaired: Only follows one step commands consistently, Follows multi-step commands inconsistently, Follows multi-step commands with increased time     Cueing  General Comments   Cueing Techniques: Verbal cues;Gestural cues  VSS on RA    Home Living Family/patient expects to be discharged to:: Skilled nursing facility           Additional Comments: prior to recent hip sx and SNF discharge, pt was living at home with 24/7 care from family.      Prior Functioning/Environment Prior Level of Function : Needs assist;History of Falls (last six months)             Mobility Comments: Working with PT at [facility], walked ~12ft with RW. Otherwise staff has been using wc for mobility ADLs Comments: Staff at SNF assisting wtih all aspects of ADLs. Using bried at [facility]. Prior to hip sx, pt was mostly mod I for ADLs, family assisted with showering    OT Problem List: Decreased strength;Pain;Decreased activity tolerance;Impaired balance (sitting and/or standing);Decreased knowledge of use of DME or AE   OT Treatment/Interventions: Self-care/ADL training;Therapeutic exercise;DME and/or AE instruction;Cognitive remediation/compensation;Patient/family education;Balance training      OT Goals(Current goals can be found in the care plan section)   Acute Rehab OT Goals Patient Stated Goal: bet better and go home OT  Goal Formulation: With patient/family Time For Goal Achievement: 06/15/24 Potential to Achieve Goals: Good ADL Goals Pt Will Perform Grooming: with supervision;standing Pt Will Perform Lower Body Dressing: with contact guard assist;sit to/from stand Pt Will Transfer to Toilet: with supervision;ambulating Pt Will Perform Toileting - Clothing Manipulation and hygiene: with modified independence;sitting/lateral leans   OT Frequency:  Min 2X/week       AM-PAC OT 6 Clicks Daily Activity     Outcome Measure Help from another person eating meals?: None Help from another person taking care of personal grooming?: A Little Help from another person toileting, which includes using toliet, bedpan, or urinal?: A Little Help from another person bathing (including washing, rinsing, drying)?: A Lot Help from another person to put on and taking off regular upper body clothing?: A Little Help from another person to put on and taking off regular lower body clothing?: A Lot 6 Click Score: 17   End of Session Equipment Utilized During Treatment: Rolling walker (2 wheels) Nurse Communication: Mobility status  Activity Tolerance: Patient tolerated treatment well Patient left: in bed;with call bell/phone within reach;with bed alarm set  OT Visit Diagnosis: Other abnormalities of gait and mobility (R26.89);Muscle weakness (generalized) (M62.81);History of falling (Z91.81)                Time: 8864-8846 OT Time Calculation (min): 18 min  Charges:  OT General Charges $OT Visit: 1 Visit OT Evaluation $OT Eval Moderate Complexity: 1 Mod  Lucie Kendall, OTR/L Acute Rehabilitation Services Office [phone] Secure Chat Communication Preferred   Lucie JONETTA Kendall 06/01/2024, 2:17 PM

## 2024-06-02 ENCOUNTER — Telehealth (HOSPITAL_COMMUNITY): Payer: Self-pay | Admitting: Pharmacy Technician

## 2024-06-02 ENCOUNTER — Other Ambulatory Visit (HOSPITAL_COMMUNITY): Payer: Self-pay

## 2024-06-02 DIAGNOSIS — I2699 Other pulmonary embolism without acute cor pulmonale: Secondary | ICD-10-CM | POA: Diagnosis not present

## 2024-06-02 LAB — CBC
HCT: 30.6 % — ABNORMAL LOW (ref 36.0–46.0)
Hemoglobin: 9.9 g/dL — ABNORMAL LOW (ref 12.0–15.0)
MCH: 31.8 pg (ref 26.0–34.0)
MCHC: 32.4 g/dL (ref 30.0–36.0)
MCV: 98.4 fL (ref 80.0–100.0)
Platelets: 335 K/uL (ref 150–400)
RBC: 3.11 MIL/uL — ABNORMAL LOW (ref 3.87–5.11)
RDW: 16.1 % — ABNORMAL HIGH (ref 11.5–15.5)
WBC: 4.7 K/uL (ref 4.0–10.5)
nRBC: 0.4 % — ABNORMAL HIGH (ref 0.0–0.2)

## 2024-06-02 LAB — HEPARIN LEVEL (UNFRACTIONATED): Heparin Unfractionated: 0.43 [IU]/mL (ref 0.30–0.70)

## 2024-06-02 MED ORDER — APIXABAN 5 MG PO TABS: 5.0000 mg | ORAL_TABLET | Freq: Two times a day (BID) | ORAL | Status: DC

## 2024-06-02 MED ORDER — APIXABAN 5 MG PO TABS
10.0000 mg | ORAL_TABLET | Freq: Two times a day (BID) | ORAL | Status: DC
  Administered 2024-06-02 – 2024-06-04 (×5): 10 mg via ORAL
  Filled 2024-06-02 (×5): qty 2

## 2024-06-02 MED ORDER — APIXABAN (ELIQUIS) VTE STARTER PACK (10MG AND 5MG): ORAL_TABLET | ORAL | Status: DC

## 2024-06-02 MED ORDER — OXYCODONE HCL 5 MG PO TABS: 2.5000 mg | ORAL_TABLET | Freq: Three times a day (TID) | ORAL | 0 refills | Status: DC | PRN

## 2024-06-02 NOTE — Progress Notes (Signed)
 PHARMACY - ANTICOAGULATION CONSULT NOTE  Pharmacy Consult for IV heparin   Indication: pulmonary embolus  Allergies[1]  Patient Measurements: Height: 4' 11 (149.9 cm) Weight: 45.4 kg (100 lb 1.4 oz) IBW/kg (Calculated) : 43.2 HEPARIN  DW (KG): 45.4  Vital Signs: Temp: 98.7 F (37.1 C) (01/21 0829) Temp Source: Oral (01/21 0829) BP: 137/58 (01/21 0844) Pulse Rate: 76 (01/21 0844)  Labs: Recent Labs    05/31/24 0744 05/31/24 0755 05/31/24 1759 06/01/24 0509 06/02/24 0509  HGB 10.8* 11.6*  --  9.5* 9.9*  HCT 33.7* 34.0*  --  29.6* 30.6*  PLT 398  --   --  339 335  HEPARINUNFRC  --   --  0.33 0.50 0.43  CREATININE 1.22* 1.30*  --  1.22*  --     Estimated Creatinine Clearance: 17.1 mL/min (A) (by C-G formula based on SCr of 1.22 mg/dL (H)).   Medical History: Past Medical History:  Diagnosis Date   Asthma    Carotid stenosis    Headache    MIGRAINES   History of MI (myocardial infarction)    Hyperglycemia    Hyperlipidemia    Hypertension    Ischemic cardiomyopathy    Myocardial infarction [facility]) 1962   Osteoporosis    Parathyroid adenoma    Assessment: Anna Barrett is a 89 y.o. year old female admitted on 05/31/2024 with concern for PE. CTA with bilateral segmental/subsegmental PE in the right upper and left lower lobes (no RHS noted). On enoxaparin  30mg  prior to admission for VTE prophylaxis post L hip fracture repair (last dose 1/18 1030). Pharmacy consulted to dose heparin .  Of note: PMH includes A-fib not on anticoagulants, anemia, and recent admission for L hip fx s/p repair on 12/30   Heparin  level this morning is therapeutic at 0.43. Hgb/Plt stable.  Per RN, no known issues with infusion or bleeding.   Goal of Therapy:  Heparin  level 0.3-0.7 units/ml Monitor platelets by anticoagulation protocol: Yes   Plan:  Continue heparin  at 900 units/hr  Daily heparin  level, CBC, and monitoring for bleeding   Thank you for allowing pharmacy to be a part of  this patients care.  [employer], Pharm.D., BCPS Clinical Pharmacist Clinical phone for 06/02/2024 from 7:30-3:00 is [phone].  **Pharmacist phone directory can be found on [email or web address] listed under [facility].  06/02/2024 9:09 AM     [1]  Allergies Allergen Reactions   Shellfish Allergy Hives   Iodine Other (See Comments)    Hx shellfish allergy   Lipitor [Atorvastatin] Other (See Comments)    Unknown reaction

## 2024-06-02 NOTE — Progress Notes (Signed)
 " PROGRESS NOTE Anna Barrett  FMW:[id number] DOB: 1922-11-10 DOA: 05/31/2024 PCP: Lenon Layman ORN, MD  Brief Narrative/Hospital Course: Anna Barrett is a 89 y.o. female with PMH of sCHF with EF 35-40%, HTN, HLD, CAD, MI, asthma, GERD, CKD-3A, A-fib not on anticoagulants, anemia, and recent admission for L hip fx s/p repair on 12/30 who p/w chest pain and found to have bilateral subsegmental pulmonary emboli.IV hep gtt started per pharmacy .  Patient has been monitored on IV heparin  overall tolerating well hemoglobin remains stable.  Echo obtained showing EF 45-50%, G1 DD RV SF is normal RV size is normal compared to prior echo LVEF has improved.  Seen by PT OT recommending skilled nursing facility  Subjective: Seen and examined today Doing well Family at bedside Overnight events afebrile vital stable Mild chest pain back pain  Assessment and plan:  Acute B/L subsegmental pulmonary emboli Echo obtained showing EF 45-50%, G1 DD RV SF is normal RV size is normal compared to prior echo LVEF has improved.  Managed with heparin  drip protocol, will transition to DOAC today as she remains hemodynamically stable on RA. Switch to eliquis  and discharge back to SNF once Auth obtained   Afib Previously on warfarin but discontinued due to bruising per Epic review Continue her amlodipine  and Coreg .   HFrEF HTN Blood pressure well-controlled, currently euvolemic, continue Coreg  and torsemide .  EF seems to be improving.   GERD Continue PPI   Back pain Continue tylenol  tid scheduled and keep oxycodone  2.5mg  TID prn for sever pain only.   Constipation Stable, continue senna, last BM 1/18  CKD 3A: Baseline creatinine around 1.2 Mobility: PT Orders: Active PT Follow up Rec: Skilled Nursing-Short Term Rehab (<3 Hours/Day)06/01/2024 1705   DVT prophylaxis:  Code Status:   Code Status: Limited: Do not attempt resuscitation (DNR) -DNR-LIMITED -Do Not Intubate/DNI  Family Communication: plan of  care discussed with patient/son at bedside. Patient status is: Remains hospitalized because of severity of illness Level of care: Telemetry   Dispo: The patient is from: SNF            Anticipated disposition:  discharge back to SNF once Auth obtained Objective: Vitals last 24 hrs: Vitals:   06/01/24 2035 06/01/24 2054 06/02/24 0829 06/02/24 0844  BP:  (!) 142/76 (!) 137/58 (!) 137/58  Pulse:  88 76 76  Resp:  16 19   Temp:  98 F (36.7 C) 98.7 F (37.1 C)   TempSrc:  Oral Oral   SpO2: 95% 94% 94%   Weight:      Height:        Physical Examination: General exam: AAOX3, HEENT:Oral mucosa moist, Ear/Nose WNL grossly Respiratory system: Bilaterally clear  Cardiovascular system: S1 & S2 +, No JVD. Gastrointestinal system: Abdomen soft,NT,ND, BS+ Nervous System: Alert, awake, moving all extremities,and following commands. Extremities: extremities warm, leg edema NEG left lef incision site healed Skin: Warm, no rashes MSK: Normal muscle bulk,tone, power   Unresulted Labs (From admission, onward)     Start     Ordered   06/01/24 0500  Heparin  level (unfractionated)  Daily,   R      05/31/24 1228   06/01/24 0500  CBC  Daily,   R      05/31/24 1228           Data Reviewed: I have personally reviewed following labs and imaging studies ( see epic result tab) CBC: Recent Labs  Lab 05/31/24 0744 05/31/24 0755 06/01/24 0509 06/02/24  0509  WBC 5.7  --  6.4 4.7  HGB 10.8* 11.6* 9.5* 9.9*  HCT 33.7* 34.0* 29.6* 30.6*  MCV 100.6*  --  98.0 98.4  PLT 398  --  339 335   CMP: Recent Labs  Lab 05/31/24 0744 05/31/24 0755 06/01/24 0509  NA 137 141 139  K 4.8 4.7 4.7  CL 103 104 105  CO2 25  --  27  GLUCOSE 120* 119* 98  BUN 39* 40* 43*  CREATININE 1.22* 1.30* 1.22*  CALCIUM 8.9  --  8.4*   GFR: Estimated Creatinine Clearance: 17.1 mL/min (A) (by C-G formula based on SCr of 1.22 mg/dL (H)). No results for input(s): AST, ALT, ALKPHOS, BILITOT, PROT,  ALBUMIN in the last 168 hours. No results for input(s): LIPASE, AMYLASE in the last 168 hours. No results for input(s): AMMONIA in the last 168 hours. Coagulation Profile: No results for input(s): INR, PROTIME in the last 168 hours. Antimicrobials/Microbiology: Anti-infectives (From admission, onward)    None         Component Value Date/Time   SDES BLOOD LEFT WRIST 10/28/2023 1834   SDES RIGHT ANTECUBITAL 10/28/2023 1834   SPECREQUEST  10/28/2023 1834    BOTTLES DRAWN AEROBIC AND ANAEROBIC Blood Culture results may not be optimal due to an inadequate volume of blood received in culture bottles   SPECREQUEST  10/28/2023 1834    BOTTLES DRAWN AEROBIC AND ANAEROBIC Blood Culture adequate volume   CULT  10/28/2023 1834    NO GROWTH 5 DAYS Performed at [facility], [address], [place], [place] [address]    CULT  10/28/2023 1834    NO GROWTH 5 DAYS Performed at [facility], [address] [place], [place] [address]    REPTSTATUS 11/02/2023 FINAL 10/28/2023 1834   REPTSTATUS 11/02/2023 FINAL 10/28/2023 1834    Procedures:    Mennie LAMY, MD [facility] 06/02/2024, 11:59 AM   "

## 2024-06-02 NOTE — Telephone Encounter (Signed)
 Patient Product/process Development Scientist completed.    The patient is insured through [employer]/ [employer]. Patient has Medicare and is not eligible for a copay card, but may be able to apply for patient assistance or Medicare RX Payment Plan (Patient Must reach out to their plan, if eligible for payment plan), if available.    Ran test claim for Eliquis  5 mg and the current 30 day co-pay is $49.73.   This test claim was processed through [facility]- copay amounts may vary at other pharmacies due to pharmacy/plan contracts, or as the patient moves through the different stages of their insurance plan.     Reyes Sharps, CPHT Pharmacy Technician Patient Advocate Specialist Lead [facility] Patient Advocate Team Direct Number: [phone]  Fax: [phone]

## 2024-06-02 NOTE — Plan of Care (Signed)

## 2024-06-02 NOTE — Discharge Instructions (Signed)
 Information on my medicine - ELIQUIS  (apixaban )  This medication education was reviewed with me or my healthcare representative as part of my discharge preparation.    Why was Eliquis  prescribed for you? Eliquis  was prescribed to treat blood clots that may have been found in the veins of your legs (deep vein thrombosis) or in your lungs (pulmonary embolism) and to reduce the risk of them occurring again.  What do You need to know about Eliquis  ? The starting dose is 10 mg (two 5 mg tablets) taken TWICE daily for the FIRST SEVEN (7) DAYS, then on (enter date)  06/09/24  the dose is reduced to ONE 5 mg tablet taken TWICE daily.  Eliquis  may be taken with or without food.   Try to take the dose about the same time in the morning and in the evening. If you have difficulty swallowing the tablet whole please discuss with your pharmacist how to take the medication safely.  Take Eliquis  exactly as prescribed and DO NOT stop taking Eliquis  without talking to the doctor who prescribed the medication.  Stopping may increase your risk of developing a new blood clot.  Refill your prescription before you run out.  After discharge, you should have regular check-up appointments with your healthcare provider that is prescribing your Eliquis .    What do you do if you miss a dose? If a dose of ELIQUIS  is not taken at the scheduled time, take it as soon as possible on the same day and twice-daily administration should be resumed. The dose should not be doubled to make up for a missed dose.  Important Safety Information A possible side effect of Eliquis  is bleeding. You should call your healthcare provider right away if you experience any of the following: Bleeding from an injury or your nose that does not stop. Unusual colored urine (red or dark brown) or unusual colored stools (red or black). Unusual bruising for unknown reasons. A serious fall or if you hit your head (even if there is no  bleeding).  Some medicines may interact with Eliquis  and might increase your risk of bleeding or clotting while on Eliquis . To help avoid this, consult your healthcare provider or pharmacist prior to using any new prescription or non-prescription medications, including herbals, vitamins, non-steroidal anti-inflammatory drugs (NSAIDs) and supplements.  This website has more information on Eliquis  (apixaban ): http://[email or web address] .com/eliquis dena

## 2024-06-03 ENCOUNTER — Inpatient Hospital Stay (HOSPITAL_COMMUNITY)

## 2024-06-03 DIAGNOSIS — I2699 Other pulmonary embolism without acute cor pulmonale: Secondary | ICD-10-CM | POA: Diagnosis not present

## 2024-06-03 LAB — CBC
HCT: 30.8 % — ABNORMAL LOW (ref 36.0–46.0)
Hemoglobin: 9.9 g/dL — ABNORMAL LOW (ref 12.0–15.0)
MCH: 31.6 pg (ref 26.0–34.0)
MCHC: 32.1 g/dL (ref 30.0–36.0)
MCV: 98.4 fL (ref 80.0–100.0)
Platelets: 306 K/uL (ref 150–400)
RBC: 3.13 MIL/uL — ABNORMAL LOW (ref 3.87–5.11)
RDW: 15.9 % — ABNORMAL HIGH (ref 11.5–15.5)
WBC: 4.4 K/uL (ref 4.0–10.5)
nRBC: 0 % (ref 0.0–0.2)

## 2024-06-03 MED ORDER — OXYCODONE HCL 5 MG PO TABS: 2.5000 mg | ORAL_TABLET | Freq: Two times a day (BID) | ORAL | 0 refills | Status: AC | PRN

## 2024-06-03 MED ORDER — APIXABAN 5 MG PO TABS: ORAL_TABLET | ORAL | Status: AC

## 2024-06-03 MED ORDER — OXYCODONE HCL 5 MG PO TABS: 2.5000 mg | ORAL_TABLET | Freq: Two times a day (BID) | ORAL | 0 refills | Status: DC | PRN

## 2024-06-03 NOTE — Plan of Care (Signed)

## 2024-06-03 NOTE — Discharge Summary (Signed)
 Physician Discharge Summary  Anna Barrett FMW:[id number] DOB: 09/03/1922 DOA: 05/31/2024  PCP: Lenon Layman ORN, MD  Admit date: 05/31/2024 Discharge date: 06/04/2024 Recommendations for Outpatient Follow-up:  Follow up with PCP in 1 weeks-call for appointment Please obtain BMP/CBC in one week F/U WITH PALLIATIVE CARE.  Discharge Dispo: SNF Discharge Condition: Stable Code Status:   Code Status: Limited: Do not attempt resuscitation (DNR) -DNR-LIMITED -Do Not Intubate/DNI  Diet recommendation:  Diet Order             Diet regular Room service appropriate? Yes; Fluid consistency: Thin  Diet effective now                    Brief/Interim Summary: Anna Barrett is a 89 y.o. female with PMH of sCHF with EF 35-40%, HTN, HLD, CAD, MI, asthma, GERD, CKD-3A, A-fib not on anticoagulants, anemia, and recent admission for L hip fx s/p repair on 12/30 who p/w chest pain and found to have bilateral subsegmental pulmonary emboli.IV hep gtt started per pharmacy .  Patient has been monitored on IV heparin  overall tolerating well hemoglobin remains stable.  Echo obtained showing EF 45-50%, G1 DD RV SF is normal RV size is normal compared to prior echo LVEF has improved.  Seen by PT OT recommending skilled nursing facility  Subjective: Seen and examined today Patient discharge held yesterday at the SNF could not take her, will be discharged 1/23 No acute events overnight  Discharge Diagnoses:   Acute B/L subsegmental pulmonary emboli: Echo obtained showing EF 45-50%, G1 DD RV SF is normal RV size is normal compared to prior echo LVEF has improved.  Managed with heparin  drip protocol, transitioned to eliquis  and remains hemodynamically stable on RA. SHE IS AT FALL RISK but at this time agreeable for DOAC, hopefully can come off in 3 months.   FTT: Recent wt loss and was struggling to participate with PTOT at SNF. She is DNR. She will benefit with Palliative eval.  A fib: Previously on  warfarin but discontinued due to bruising per Epic review.Continue her amlodipine  and Coreg .   HFrEF HTN: Blood pressure well-controlled, currently euvolemic, continue Coreg  and torsemide .EF seems to be improving.   GERD: Continue PPI   Back pain: Continue tylenol  tid scheduled and keep oxycodone  2.5mg  TID prn for sever pain only.   Constipation: Stable, continue senna, last BM 1/18  CKD 3A: Baseline creatinine around 1.2 Mobility: PT Orders: Active PT Follow up Rec: Skilled Nursing-Short Term Rehab (<3 Hours/Day)06/03/2024 1316   DVT prophylaxis:  Code Status:   Code Status: Limited: Do not attempt resuscitation (DNR) -DNR-LIMITED -Do Not Intubate/DNI  Family Communication: plan of care discussed with patient/son at bedside. Patient status is: Remains hospitalized because of severity of illness Level of care: Telemetry   Dispo: The patient is from: SNF            Anticipated disposition:  SNF once Auth obtained Objective: Vitals last 24 hrs: Vitals:   06/03/24 1606 06/03/24 1949 06/04/24 0441 06/04/24 0756  BP: (!) 121/58 136/61 (!) 139/58 (!) 142/54  Pulse: 68  71 75  Resp: 20 (!) 21    Temp: 98.4 F (36.9 C) (!) 97.5 F (36.4 C) 97.8 F (36.6 C) (!) 97.3 F (36.3 C)  TempSrc: Oral Oral Oral Oral  SpO2: 97% 96% 97% 98%  Weight:      Height:       Physical Examination: General exam: AAOX3, HEENT:Oral mucosa moist, Ear/Nose WNL grossly Respiratory system:  cta Bilaterally Cardiovascular system: S1 & S2 +, No JVD. Gastrointestinal system: Abdomen soft,NT,ND, BS+ Nervous System: Alert, awake, moving all extremities,and following commands. Extremities: extremities warm, leg edema NEG left lef incision site healed Skin: Warm, no rashes MSK: Normal muscle bulk,tone, power      Consultation: See note.  Discharge Instructions  Discharge Instructions     Increase activity slowly   Complete by: As directed    No wound care   Complete by: As directed        Allergies as of 06/04/2024       Reactions   Shellfish Allergy Hives   Iodine Other (See Comments)   Hx shellfish allergy   Lipitor [atorvastatin] Other (See Comments)   Unknown reaction        Medication List     PAUSE taking these medications    potassium chloride  10 MEQ tablet Wait to take this until your doctor or other care provider tells you to start again. Commonly known as: KLOR-CON  Take 10 mEq by mouth at bedtime.       STOP taking these medications    enoxaparin  30 MG/0.3ML injection Commonly known as: LOVENOX        TAKE these medications    acetaminophen  500 MG tablet Commonly known as: TYLENOL  Take 2 tablets (1,000 mg total) by mouth every 8 (eight) hours.   albuterol  108 (90 Base) MCG/ACT inhaler Commonly known as: VENTOLIN  HFA Inhale 2 puffs into the lungs every 4 (four) hours as needed for wheezing.   amLODipine  2.5 MG tablet Commonly known as: NORVASC  Take 2.5 mg by mouth daily.   apixaban  5 MG Tabs tablet Commonly known as: ELIQUIS  Take 2 tablets (10 mg total) by mouth 2 (two) times daily for 6 days, THEN 1 tablet (5 mg total) 2 (two) times daily. Start taking on: June 03, 2024   BOOST PO Take 237 mLs by mouth 3 (three) times daily between meals.   carvedilol  3.125 MG tablet Commonly known as: COREG  Take 3.125 mg by mouth 2 (two) times daily with a meal.   Cholecalciferol  25 MCG (1000 UT) tablet Take 1,000 Units by mouth in the morning and at bedtime.   lidocaine  4 % Place 1 patch onto the skin daily. Apply 1 patch topically to right ribcage. Leave on 12 hours and take off for 12 hours.   nitroGLYCERIN  0.4 MG SL tablet Commonly known as: NITROSTAT  Place 0.4 mg under the tongue every 5 (five) minutes x 3 doses as needed for chest pain.   omeprazole 10 MG capsule Commonly known as: PRILOSEC Take 10 mg by mouth daily.   ondansetron  4 MG disintegrating tablet Commonly known as: ZOFRAN -ODT Allow 1-2 tablets to dissolve in  your mouth every 8 hours as needed for nausea/vomiting   oxyCODONE  5 MG immediate release tablet Commonly known as: Oxy IR/ROXICODONE  Take 0.5 tablets (2.5 mg total) by mouth 2 (two) times daily as needed for up to 8 doses for severe pain (pain score 7-10). What changed:  when to take this reasons to take this   polyethylene glycol powder 17 GM/SCOOP powder Commonly known as: GLYCOLAX /MIRALAX  Take 34 g by mouth daily.   senna 8.6 MG Tabs tablet Commonly known as: SENOKOT Take 1 tablet by mouth in the morning and at bedtime.   torsemide  20 MG tablet Commonly known as: DEMADEX  Take 20 mg by mouth daily.   Wixela Inhub 100-50 MCG/ACT Aepb Generic drug: fluticasone -salmeterol Inhale 1 puff into the lungs at bedtime.  Follow-up Information     Lenon Layman ORN, MD Follow up in 1 week(s).   Specialty: Internal Medicine Contact information: [address] [facility] [address] [place] [place] [address] [phone]                Allergies[1]  The results of significant diagnostics from this hospitalization (including imaging, microbiology, ancillary and laboratory) are listed below for reference.    Microbiology: No results found for this or any previous visit (from the past 240 hours).  Procedures/Studies: ECHOCARDIOGRAM COMPLETE Result Date: 06/01/2024    ECHOCARDIOGRAM REPORT   Patient Name:   Anna Barrett Date of Exam: 06/01/2024 Medical Rec #:  [id number]     Height:       59.0 in Accession #:    [id number]    Weight:       100.1 lb Date of Birth:  05-Apr-1923     BSA:          1.374 m Patient Age:    89 years      BP:           157/66 mmHg Patient Gender: F             HR:           78 bpm. Exam Location:  Inpatient Procedure: 2D Echo, Cardiac Doppler and Color Doppler (Both Spectral and Color            Flow Doppler were utilized during procedure). Indications:    Chest Pain  History:        Patient has prior history of Echocardiogram examinations,  most                 recent 10/30/2023. CAD, Arrythmias:Atrial Fibrillation; Risk                 Factors:Hypertension.  Sonographer:    Sherlean Dubin Referring Phys: [id number] Twylia Oka  Sonographer Comments: Image acquisition challenging due to respiratory motion and Image acquisition challenging due to patient body habitus. IMPRESSIONS  1. Left ventricular ejection fraction, by estimation, is 45 to 50%. The left ventricle has mildly decreased function. The left ventricle demonstrates global hypokinesis. Left ventricular diastolic parameters are consistent with Grade I diastolic dysfunction (impaired relaxation). Elevated left atrial pressure.  2. Right ventricular systolic function is normal. The right ventricular size is normal. Tricuspid regurgitation signal is inadequate for assessing PA pressure.  3. Left atrial size was severely dilated.  4. The mitral valve is degenerative. No evidence of mitral valve regurgitation. Mild mitral stenosis. The mean mitral valve gradient is 4.0 mmHg with average heart rate of 77 bpm. Severe mitral annular calcification.  5. The aortic valve is tricuspid. There is mild calcification of the aortic valve. There is mild thickening of the aortic valve. Aortic valve regurgitation is trivial. Aortic valve sclerosis/calcification is present, without any evidence of aortic stenosis. Comparison(s): Prior images reviewed side by side. The left ventricular function has improved. FINDINGS  Left Ventricle: Left ventricular ejection fraction, by estimation, is 45 to 50%. The left ventricle has mildly decreased function. The left ventricle demonstrates global hypokinesis. The left ventricular internal cavity size was normal in size. There is  no left ventricular hypertrophy. Abnormal (paradoxical) septal motion, consistent with left bundle branch block. Left ventricular diastolic parameters are consistent with Grade I diastolic dysfunction (impaired relaxation). Elevated left atrial pressure.  Right Ventricle: The right ventricular size is normal. No increase in right ventricular wall thickness. Right  ventricular systolic function is normal. Tricuspid regurgitation signal is inadequate for assessing PA pressure. Left Atrium: Left atrial size was severely dilated. Right Atrium: Right atrial size was normal in size. Pericardium: There is no evidence of pericardial effusion. Mitral Valve: The mitral valve is degenerative in appearance. Severe mitral annular calcification. No evidence of mitral valve regurgitation. Mild mitral valve stenosis. MV peak gradient, 11.8 mmHg. The mean mitral valve gradient is 4.0 mmHg with average  heart rate of 77 bpm. Tricuspid Valve: The tricuspid valve is normal in structure. Tricuspid valve regurgitation is trivial. Aortic Valve: The aortic valve is tricuspid. There is mild calcification of the aortic valve. There is mild thickening of the aortic valve. Aortic valve regurgitation is trivial. Aortic valve sclerosis/calcification is present, without any evidence of aortic stenosis. Aortic valve mean gradient measures 3.0 mmHg. Aortic valve peak gradient measures 5.7 mmHg. Aortic valve area, by VTI measures 2.45 cm. Pulmonic Valve: The pulmonic valve was grossly normal. Pulmonic valve regurgitation is not visualized. No evidence of pulmonic stenosis. Aorta: The aortic root and ascending aorta are structurally normal, with no evidence of dilitation. IAS/Shunts: No atrial level shunt detected by color flow Doppler.  LEFT VENTRICLE PLAX 2D LVIDd:         3.10 cm   Diastology LVIDs:         2.60 cm   LV e' medial:    4.13 cm/s LV PW:         1.00 cm   LV E/e' medial:  23.0 LV IVS:        1.00 cm   LV e' lateral:   6.22 cm/s LVOT diam:     1.80 cm   LV E/e' lateral: 15.3 LV SV:         60 LV SV Index:   44 LVOT Area:     2.54 cm  RIGHT VENTRICLE             IVC RV Basal diam:  3.90 cm     IVC diam: 1.70 cm RV Mid diam:    2.80 cm RV S prime:     10.90 cm/s TAPSE (M-mode): 1.7 cm  LEFT ATRIUM             Index        RIGHT ATRIUM           Index LA diam:        2.30 cm 1.67 cm/m   RA Area:     15.70 cm LA Vol (A2C):   81.9 ml 59.60 ml/m  RA Volume:   44.20 ml  32.16 ml/m LA Vol (A4C):   43.1 ml 31.36 ml/m LA Biplane Vol: 60.8 ml 44.24 ml/m  AORTIC VALVE AV Area (Vmax):    2.61 cm AV Area (Vmean):   2.62 cm AV Area (VTI):     2.45 cm AV Vmax:           119.50 cm/s AV Vmean:          81.450 cm/s AV VTI:            0.247 m AV Peak Grad:      5.7 mmHg AV Mean Grad:      3.0 mmHg LVOT Vmax:         122.50 cm/s LVOT Vmean:        83.900 cm/s LVOT VTI:          0.238 m LVOT/AV VTI ratio: 0.96  AORTA Ao Root diam: 2.80  cm Ao Asc diam:  2.90 cm MITRAL VALVE MV Area (PHT): 3.48 cm     SHUNTS MV Area VTI:   1.50 cm     Systemic VTI:  0.24 m MV Peak grad:  11.8 mmHg    Systemic Diam: 1.80 cm MV Mean grad:  4.0 mmHg MV Vmax:       1.72 m/s MV Vmean:      93.9 cm/s MV Decel Time: 218 msec MR Peak grad: 9.1 mmHg MR Vmax:      150.50 cm/s MV E velocity: 95.10 cm/s MV A velocity: 151.00 cm/s MV E/A ratio:  0.63 Mihai Croitoru MD Electronically signed by Jerel Balding MD Signature Date/Time: 06/01/2024/5:05:24 PM    Final    VAS US  LOWER EXTREMITY VENOUS (DVT) (ONLY [facility] & [facility]) Result Date: 05/31/2024  Lower Venous DVT Study Patient Name:  Anna Barrett  Date of Exam:   05/31/2024 Medical Rec #: [id number]      Accession #:    [id number] Date of Birth: 23-Oct-1922      Patient Gender: F Patient Age:   59 years Exam Location:  [facility] Procedure:      VAS US  LOWER EXTREMITY VENOUS (DVT) Referring Phys: LAMAR PATERSON --------------------------------------------------------------------------------  Indications: Pain.  Risk Factors: Recent left femur fracture s/p surgical repair on 05/11/2024. Comparison Study: No previous LLEV exams Performing Technologist: Jody Hill RVT, RDMS  Examination Guidelines: A complete evaluation includes B-mode imaging, spectral Doppler, color Doppler, and power  Doppler as needed of all accessible portions of each vessel. Bilateral testing is considered an integral part of a complete examination. Limited examinations for reoccurring indications may be performed as noted. The reflux portion of the exam is performed with the patient in reverse Trendelenburg.  +-----+---------------+---------+-----------+----------+--------------+ RIGHTCompressibilityPhasicitySpontaneityPropertiesThrombus Aging +-----+---------------+---------+-----------+----------+--------------+ CFV  Full           Yes      Yes                                 +-----+---------------+---------+-----------+----------+--------------+   +---------+---------------+---------+-----------+----------+-------------------+ LEFT     CompressibilityPhasicitySpontaneityPropertiesThrombus Aging      +---------+---------------+---------+-----------+----------+-------------------+ CFV      Full           Yes      Yes                                      +---------+---------------+---------+-----------+----------+-------------------+ SFJ      Full                                                             +---------+---------------+---------+-----------+----------+-------------------+ FV Prox  Full           Yes      Yes                                      +---------+---------------+---------+-----------+----------+-------------------+ FV Mid   Full           Yes      Yes                                      +---------+---------------+---------+-----------+----------+-------------------+  FV DistalFull           Yes      Yes                                      +---------+---------------+---------+-----------+----------+-------------------+ PFV      Full                                                             +---------+---------------+---------+-----------+----------+-------------------+ POP      Full           Yes      Yes                                       +---------+---------------+---------+-----------+----------+-------------------+ PTV      Full                                         Not well visualized +---------+---------------+---------+-----------+----------+-------------------+ PERO     Full                                         Not well visualized +---------+---------------+---------+-----------+----------+-------------------+    Summary: RIGHT: - No evidence of common femoral vein obstruction.   LEFT: - There is no evidence of deep vein thrombosis in the lower extremity.  - No cystic structure found in the popliteal fossa. Subcutaneous edema in area of the calf.  *See table(s) above for measurements and observations. Electronically signed by Penne Colorado MD on 05/31/2024 at 4:10:03 PM.    Final    CT Angio Chest PE W and/or Wo Contrast Result Date: 05/31/2024 CLINICAL DATA:  Positive D-dimer, chest pain. EXAM: CT ANGIOGRAPHY CHEST WITH CONTRAST TECHNIQUE: Multidetector CT imaging of the chest was performed using the standard protocol during bolus administration of intravenous contrast. Multiplanar CT image reconstructions and MIPs were obtained to evaluate the vascular anatomy. RADIATION DOSE REDUCTION: This exam was performed according to the departmental dose-optimization program which includes automated exposure control, adjustment of the mA and/or kV according to patient size and/or use of iterative reconstruction technique. CONTRAST:  75mL OMNIPAQUE  IOHEXOL  350 MG/ML SOLN COMPARISON:  None Available. FINDINGS: Cardiovascular: Subsegmental pulmonary arterial filling defect in the right upper lobe (8/64-68). Segmental/subsegmental pulmonary arterial filling defects in the left lower lobe (8/125-138). Atherosclerotic calcification of the aorta, aortic valve and coronary arteries. Heart is enlarged with left ventricular hypertrophy. No pericardial effusion. Mediastinum/Nodes: No pathologically enlarged mediastinal, hilar or  axillary lymph nodes. Air and food debris in the esophagus, indicative of dysmotility. Lungs/Pleura: Small right and trace left pleural effusions with subsegmental volume loss in both lower lobes. Lungs are otherwise clear. Airway is unremarkable. Upper Abdomen: Cholecystectomy. Thickening of the left adrenal gland. No specific follow-up necessary. Large hiatal hernia. Visualized portions of the liver, adrenal glands, kidneys, spleen, pancreas, stomach and bowel are otherwise grossly unremarkable. No upper abdominal adenopathy. Musculoskeletal: Osteopenia.  Degenerative changes in the spine. Review of the MIP  images confirms the above findings. IMPRESSION: 1. A few scattered segmental/subsegmental pulmonary emboli in the right upper and left lower lobes. Critical Value/emergent results were called by telephone at the time of interpretation on 05/31/2024 at 9:22 am to provider [facility] , who verbally acknowledged these results. 2. Small right and trace left pleural effusions. 3. Large hiatal hernia. 4. Aortic atherosclerosis (ICD10-I70.0). Coronary artery calcification. Electronically Signed   By: Newell Eke M.D.   On: 05/31/2024 09:23   DG CHEST PORT 1 VIEW Result Date: 05/31/2024 CLINICAL DATA:  Chest pain EXAM: PORTABLE CHEST 1 VIEW COMPARISON:  05/10/2024 FINDINGS: The lungs are clear without focal pneumonia, edema, pneumothorax or pleural effusion. Interstitial markings are diffusely coarsened with chronic features. The cardio pericardial silhouette is enlarged. Bones are diffusely demineralized. Telemetry leads overlie the chest. IMPRESSION: Chronic interstitial coarsening without acute cardiopulmonary findings. Electronically Signed   By: Camellia Candle M.D.   On: 05/31/2024 08:34   DG HIP UNILAT WITH PELVIS 2-3 VIEWS LEFT Result Date: 05/11/2024 CLINICAL DATA:  Elective surgery. EXAM: DG HIP (WITH OR WITHOUT PELVIS) 2-3V LEFT COMPARISON:  Preoperative imaging FINDINGS: Five fluoroscopic spot  views of the left hip and femur submitted from the operating room. Femoral intramedullary nail with trans trochanteric and distal locking screw fixation traverse comminuted proximal femur fracture. Fluoroscopy time 5 minutes 55 seconds. Dose 40.68 mGy IMPRESSION: Intraoperative fluoroscopy during left femur fracture ORIF. Electronically Signed   By: Andrea Gasman M.D.   On: 05/11/2024 17:12   DG C-Arm 1-60 Min-No Report Result Date: 05/11/2024 Fluoroscopy was utilized by the requesting physician.  No radiographic interpretation.   DG C-Arm 1-60 Min-No Report Result Date: 05/11/2024 Fluoroscopy was utilized by the requesting physician.  No radiographic interpretation.   DG C-Arm 1-60 Min-No Report Result Date: 05/11/2024 Fluoroscopy was utilized by the requesting physician.  No radiographic interpretation.   DG Chest Port 1 View Result Date: 05/11/2024 EXAM: 1 VIEW(S) XRAY OF THE CHEST 05/10/2024 10:54:57 PM COMPARISON: 10/28/2023 CLINICAL HISTORY: chest pain after fall FINDINGS: LUNGS AND PLEURA: COPD with hyperinflation. No focal pulmonary opacity. No pleural effusion. No pneumothorax. HEART AND MEDIASTINUM: Aortic arch calcifications. No acute abnormality of the cardiac and mediastinal silhouettes. BONES AND SOFT TISSUES: Old healed right humeral neck fracture. IMPRESSION: 1. No acute findings. Electronically signed by: Franky Stanford MD 05/11/2024 01:46 AM EST RP Workstation: [id number]   DG Hip Unilat W or Wo Pelvis 2-3 Views Left Result Date: 05/11/2024 EXAM: 2 OR MORE VIEW(S) XRAY OF THE PELVIS AND LEFT HIP 05/10/2024 10:54:57 PM COMPARISON: None available. CLINICAL HISTORY: fall, w pain and shortening/external rotation FINDINGS: BONES AND JOINTS: SI joints are symmetric. Partially visualized intramedullary nail fixation of the right hip in place. Acute comminuted intertrochanteric fracture of the left hip with varus angulation. SOFT TISSUES: Vascular calcifications. IMPRESSION: 1. Acute  comminuted intertrochanteric fracture of the left hip with varus angulation. 2. Partially visualized intramedullary nail fixation of the right hip in place. Electronically signed by: Franky Stanford MD 05/11/2024 01:45 AM EST RP Workstation: [id number]    Labs: BNP (last 3 results) Recent Labs    10/28/23 1721  BNP 1,013.7*   Basic Metabolic Panel: Recent Labs  Lab 05/31/24 0744 05/31/24 0755 06/01/24 0509  NA 137 141 139  K 4.8 4.7 4.7  CL 103 104 105  CO2 25  --  27  GLUCOSE 120* 119* 98  BUN 39* 40* 43*  CREATININE 1.22* 1.30* 1.22*  CALCIUM 8.9  --  8.4*  Liver Function Tests: No results for input(s): AST, ALT, ALKPHOS, BILITOT, PROT, ALBUMIN in the last 168 hours. No results for input(s): LIPASE, AMYLASE in the last 168 hours. No results for input(s): AMMONIA in the last 168 hours. CBC: Recent Labs  Lab 05/31/24 0744 05/31/24 0755 06/01/24 0509 06/02/24 0509 06/03/24 0423 06/04/24 0509  WBC 5.7  --  6.4 4.7 4.4 3.6*  HGB 10.8* 11.6* 9.5* 9.9* 9.9* 9.9*  HCT 33.7* 34.0* 29.6* 30.6* 30.8* 30.4*  MCV 100.6*  --  98.0 98.4 98.4 99.0  PLT 398  --  339 335 306 286   CBG: No results for input(s): GLUCAP in the last 168 hours. Hgb A1c No results for input(s): HGBA1C in the last 72 hours. Anemia work up No results for input(s): VITAMINB12, FOLATE, FERRITIN, TIBC, IRON, RETICCTPCT in the last 72 hours.  Cardiac Enzymes: No results for input(s): CKTOTAL, CKMB, CKMBINDEX, TROPONINI in the last 168 hours. BNP: Invalid input(s): POCBNP D-Dimer No results for input(s): DDIMER in the last 72 hours. Lipid Profile No results for input(s): CHOL, HDL, LDLCALC, TRIG, CHOLHDL, LDLDIRECT in the last 72 hours. Thyroid  function studies No results for input(s): TSH, T4TOTAL, T3FREE, THYROIDAB in the last 72 hours.  Invalid input(s): FREET3 Urinalysis No results found for: COLORURINE, APPEARANCEUR,  LABSPEC, PHURINE, GLUCOSEU, HGBUR, BILIRUBINUR, KETONESUR, PROTEINUR, UROBILINOGEN, NITRITE, LEUKOCYTESUR Sepsis Labs Recent Labs  Lab 06/01/24 0509 06/02/24 0509 06/03/24 0423 06/04/24 0509  WBC 6.4 4.7 4.4 3.6*   Microbiology No results found for this or any previous visit (from the past 240 hours).   Time coordinating discharge: 35 minutes  SIGNED: Mennie LAMY, MD  [facility] 06/04/2024, 9:08 AM  If 7PM-7AM, please contact night-coverage [email or web address]       [1]  Allergies Allergen Reactions   Shellfish Allergy Hives   Iodine Other (See Comments)    Hx shellfish allergy   Lipitor [Atorvastatin] Other (See Comments)    Unknown reaction

## 2024-06-03 NOTE — Progress Notes (Signed)
 Physical Therapy Treatment Patient Details Name: Anna Barrett MRN: [id number] DOB: February 20, 1923 Today's Date: 06/03/2024   History of Present Illness 89 y.o. female who presented with chest pain. Found to have bilateral subsegmental pulmonary emboli. PMHx: 12/30 left hip cephalomedullary nailing, CHF with EF 35-40%, HTN,  HLD, CAD, MI, asthma, GERD, CKD-3A, A-fib not on anticoagulants, anemia    PT Comments  Pt tolerated session well, completing sequence of sit to stands and standing marches with AD and no physical assistance; heavy cueing for sequencing. Pt then ambulated short room level distances with AD and no physical assistance but requires seated rest break secondary to fatigue and pain. PT will continue to treat pt while she is admitted. Patient will benefit from continued inpatient follow up therapy, <3 hours/day.     If plan is discharge home, recommend the following: A little help with walking and/or transfers;Assistance with cooking/housework;A little help with bathing/dressing/bathroom;Help with stairs or ramp for entrance;Assist for transportation;Direct supervision/assist for medications management;Supervision due to cognitive status   Can travel by private vehicle     No  Equipment Recommendations  None recommended by PT    Recommendations for Other Services       Precautions / Restrictions Precautions Precautions: Fall Recall of Precautions/Restrictions: Intact Restrictions Weight Bearing Restrictions Per Provider Order: Yes LLE Weight Bearing Per Provider Order: Weight bearing as tolerated     Mobility  Bed Mobility Overal bed mobility: Needs Assistance Bed Mobility: Sit to Supine       Sit to supine: Min assist   General bed mobility comments: Pt completed sit to supine on L side of bed with HOB flat and minimal physical assistance for aiding RLE into bed. Increased time to complete and VC for sequencing.    Transfers Overall transfer level: Needs  assistance Equipment used: Rolling walker (2 wheels) Transfers: Sit to/from Stand Sit to Stand: Contact guard assist           General transfer comment: STS from EOB x4 with RW and no physical assistance. VC for UE and LE placement, with pt pushing up from bed with RUE and stabilizing walker with LUE. Increased time to complete.    Ambulation/Gait Ambulation/Gait assistance: Contact guard assist Gait Distance (Feet): 12 Feet (x2) Assistive device: Rolling walker (2 wheels) Gait Pattern/deviations: Step-to pattern, Decreased stride length, Antalgic, Decreased step length - right, Decreased stance time - left, Decreased weight shift to left, Trunk flexed Gait velocity: reduced Gait velocity interpretation: <1.31 ft/sec, indicative of household ambulator   General Gait Details: Pt demonstrates step to gait pattern with increased reliance on RW for stability. Pt demonstrates reduced step length on the R with reduced stance time on the L and antalgic like gait.   Stairs             Wheelchair Mobility     Tilt Bed    Modified Rankin (Stroke Patients Only)       Balance Overall balance assessment: Needs assistance Sitting-balance support: No upper extremity supported, Feet supported Sitting balance-Leahy Scale: Fair Sitting balance - Comments: able to sit EOB without UE support and no physical assistance   Standing balance support: Bilateral upper extremity supported, During functional activity, Reliant on assistive device for balance Standing balance-Leahy Scale: Poor Standing balance comment: reliant on external support                            Communication Communication Communication: Impaired Factors Affecting Communication:  Hearing impaired  Cognition Arousal: Alert Behavior During Therapy: WFL for tasks assessed/performed   PT - Cognitive impairments: History of cognitive impairments, Memory, Sequencing                          Following commands: Impaired Following commands impaired: Follows one step commands with increased time    Cueing Cueing Techniques: Verbal cues, Gestural cues, Tactile cues  Exercises Total Joint Exercises Marching in Standing: AROM, Both, 10 reps, Standing (x3 sets)    General Comments General comments (skin integrity, edema, etc.): VSS on RA      Pertinent Vitals/Pain Pain Assessment Pain Assessment: Faces Faces Pain Scale: Hurts little more Pain Location: L hip Pain Descriptors / Indicators: Discomfort, Grimacing, Guarding Pain Intervention(s): Limited activity within patient's tolerance, Monitored during session    Home Living                          Prior Function            PT Goals (current goals can now be found in the care plan section) Acute Rehab PT Goals Patient Stated Goal: return to SNF prior to home PT Goal Formulation: With family Time For Goal Achievement: 06/15/24 Potential to Achieve Goals: Fair Progress towards PT goals: Progressing toward goals    Frequency    Min 2X/week      PT Plan      Co-evaluation              AM-PAC PT 6 Clicks Mobility   Outcome Measure  Help needed turning from your back to your side while in a flat bed without using bedrails?: A Little Help needed moving from lying on your back to sitting on the side of a flat bed without using bedrails?: A Little Help needed moving to and from a bed to a chair (including a wheelchair)?: A Little Help needed standing up from a chair using your arms (e.g., wheelchair or bedside chair)?: A Little Help needed to walk in hospital room?: A Little Help needed climbing 3-5 steps with a railing? : Total 6 Click Score: 16    End of Session Equipment Utilized During Treatment: Gait belt Activity Tolerance: Patient tolerated treatment well Patient left: in bed;with call bell/phone within reach;with bed alarm set;with family/visitor present Nurse Communication:  Mobility status PT Visit Diagnosis: Other abnormalities of gait and mobility (R26.89);Pain;History of falling (Z91.81);Muscle weakness (generalized) (M62.81) Pain - Right/Left: Left Pain - part of body: Hip     Time: 1206-1229 PT Time Calculation (min) (ACUTE ONLY): 23 min  Charges:    $Therapeutic Exercise: 8-22 mins $Therapeutic Activity: 8-22 mins PT General Charges $$ ACUTE PT VISIT: 1 Visit                     Leontine Hilt DPT Acute Rehab Services [phone] Prefer contact via chat    Leontine NOVAK Dilraj Killgore 06/03/2024, 1:24 PM

## 2024-06-03 NOTE — TOC Progression Note (Addendum)
 Transition of Care [facility]) - Progression Note    Patient Details  Name: Anna Barrett MRN: [id number] Date of Birth: 1922-11-18  [facility] CM/SW Contact  Luann SHAUNNA Cum[place], [place] Phone Number: 06/03/2024, 12:23 PM  Clinical Narrative:     SNF auth went to peer review. MD would need to call Dr. Janit at [phone]. It's due by 3pm today. CSW updated provider.   1130: provider completed peer review. Insurance is requesting PT see pt again to see if she can participate more prior to making a final decision on auth. PT aware and plans to see pt today.    1520: SNF auth approved for 7 days #[id number] [facility] approved as well #[id number] [facility] cannot admit today due to pharmacy issues; they will be able to admit pt tomorrow morning.     Expected Discharge Plan and Services         Expected Discharge Date: 06/02/24                                     Social Drivers of Health (SDOH) Interventions SDOH Screenings   Food Insecurity: Patient Declined (05/31/2024)  Housing: Patient Declined (05/31/2024)  Transportation Needs: Patient Declined (05/31/2024)  Utilities: Patient Declined (05/31/2024)  Financial Resource Strain: Low Risk  (05/24/2024)   Received from [facility]  Social Connections: Patient Declined (05/31/2024)  Tobacco Use: Low Risk (05/31/2024)    Readmission Risk Interventions     No data to display

## 2024-06-04 LAB — CBC
HCT: 30.4 % — ABNORMAL LOW (ref 36.0–46.0)
Hemoglobin: 9.9 g/dL — ABNORMAL LOW (ref 12.0–15.0)
MCH: 32.2 pg (ref 26.0–34.0)
MCHC: 32.6 g/dL (ref 30.0–36.0)
MCV: 99 fL (ref 80.0–100.0)
Platelets: 286 K/uL (ref 150–400)
RBC: 3.07 MIL/uL — ABNORMAL LOW (ref 3.87–5.11)
RDW: 15.9 % — ABNORMAL HIGH (ref 11.5–15.5)
WBC: 3.6 K/uL — ABNORMAL LOW (ref 4.0–10.5)
nRBC: 0 % (ref 0.0–0.2)

## 2024-06-04 NOTE — Progress Notes (Signed)
 Report Given to [facility]

## 2024-06-04 NOTE — Progress Notes (Signed)
 Patient and examined this morning Patient appears pleasant, not in distress, She is dressed up and ready to go Patient could not be discharged to SNF yesterday but they cannot take her to SNF today Discharge summary updated reflecting today's date Patient still for discharge to SNF today

## 2024-06-04 NOTE — Progress Notes (Signed)
 Patient discharged, Important Message Letter mailed to patient.

## 2024-06-04 NOTE — Plan of Care (Signed)

## 2024-06-04 NOTE — Care Management Important Message (Signed)
 Important Message  Patient Details  Name: Anna Barrett MRN: [id number] Date of Birth: Sep 14, 1922   Important Message Given:  No     Jennie Laneta Dragon 06/04/2024, 11:58 AM

## 2024-06-04 NOTE — TOC Transition Note (Signed)
 Transition of Care [facility]) - Discharge Note   Patient Details  Name: Anna Barrett MRN: [id number] Date of Birth: January 23, 1923  Transition of Care [facility]) CM/SW Contact:  Luann SHAUNNA Cumming, LCSW Phone Number: 06/04/2024, 9:55 AM   Clinical Narrative:      Per MD patient ready for DC to [facility]. RN, patient, patient's family, and facility notified of DC. Discharge Summary and FL2 sent to facility. RN to call report prior to discharge [phone] ). DC packet on chart. Ambulance transport requested for patient.   CSW will sign off for now as social work intervention is no longer needed. Please consult us  again if new needs arise.         Patient Goals and CMS Choice      Return to [facility] and Services Additional resources added to the After Visit Summary for                                       Social Drivers of Health (SDOH) Interventions SDOH Screenings   Food Insecurity: Patient Declined (05/31/2024)  Housing: Patient Declined (05/31/2024)  Transportation Needs: Patient Declined (05/31/2024)  Utilities: Patient Declined (05/31/2024)  Financial Resource Strain: Low Risk  (05/24/2024)   Received from [facility]  Social Connections: Patient Declined (05/31/2024)  Tobacco Use: Low Risk (05/31/2024)     Readmission Risk Interventions     No data to display
